# Patient Record
Sex: Male | Born: 1975 | Race: White | Hispanic: No | Marital: Single | Smoking: Former smoker
Health system: Southern US, Community
[De-identification: ages and names within clinical notes are randomized; demographics above are authoritative.]

## PROBLEM LIST (undated history)

## (undated) DIAGNOSIS — F1110 Opioid abuse, uncomplicated: Secondary | ICD-10-CM

## (undated) DIAGNOSIS — J939 Pneumothorax, unspecified: Secondary | ICD-10-CM

## (undated) DIAGNOSIS — M549 Dorsalgia, unspecified: Secondary | ICD-10-CM

## (undated) DIAGNOSIS — IMO0002 Reserved for concepts with insufficient information to code with codable children: Secondary | ICD-10-CM

## (undated) DIAGNOSIS — G8929 Other chronic pain: Secondary | ICD-10-CM

## (undated) DIAGNOSIS — J439 Emphysema, unspecified: Secondary | ICD-10-CM

## (undated) HISTORY — PX: OTHER SURGICAL HISTORY: SHX169

## (undated) HISTORY — PX: LUNG SURGERY: SHX703

## (undated) HISTORY — PX: NOSE SURGERY: SHX723

---

## 1998-06-26 ENCOUNTER — Inpatient Hospital Stay (HOSPITAL_COMMUNITY): Admission: EM | Admit: 1998-06-26 | Discharge: 1998-07-01 | Payer: Self-pay | Admitting: Emergency Medicine

## 1998-06-26 ENCOUNTER — Encounter: Payer: Self-pay | Admitting: Thoracic Surgery

## 1998-06-27 ENCOUNTER — Encounter: Payer: Self-pay | Admitting: Thoracic Surgery

## 1998-06-28 ENCOUNTER — Encounter: Payer: Self-pay | Admitting: Thoracic Surgery

## 1998-06-29 ENCOUNTER — Encounter: Payer: Self-pay | Admitting: Thoracic Surgery

## 1998-06-30 ENCOUNTER — Encounter: Payer: Self-pay | Admitting: Thoracic Surgery

## 1998-07-01 ENCOUNTER — Encounter: Payer: Self-pay | Admitting: Thoracic Surgery

## 2008-03-02 ENCOUNTER — Emergency Department (HOSPITAL_BASED_OUTPATIENT_CLINIC_OR_DEPARTMENT_OTHER): Admission: EM | Admit: 2008-03-02 | Discharge: 2008-03-02 | Payer: Self-pay | Admitting: Emergency Medicine

## 2008-03-02 ENCOUNTER — Ambulatory Visit: Payer: Self-pay | Admitting: Diagnostic Radiology

## 2008-08-12 ENCOUNTER — Emergency Department (HOSPITAL_BASED_OUTPATIENT_CLINIC_OR_DEPARTMENT_OTHER): Admission: EM | Admit: 2008-08-12 | Discharge: 2008-08-13 | Payer: Self-pay | Admitting: Emergency Medicine

## 2008-08-13 ENCOUNTER — Ambulatory Visit: Payer: Self-pay | Admitting: Diagnostic Radiology

## 2008-08-22 ENCOUNTER — Ambulatory Visit: Payer: Self-pay | Admitting: Interventional Radiology

## 2008-08-22 ENCOUNTER — Emergency Department (HOSPITAL_BASED_OUTPATIENT_CLINIC_OR_DEPARTMENT_OTHER): Admission: EM | Admit: 2008-08-22 | Discharge: 2008-08-22 | Payer: Self-pay | Admitting: Emergency Medicine

## 2008-11-05 ENCOUNTER — Emergency Department (HOSPITAL_BASED_OUTPATIENT_CLINIC_OR_DEPARTMENT_OTHER): Admission: EM | Admit: 2008-11-05 | Discharge: 2008-11-05 | Payer: Self-pay | Admitting: Emergency Medicine

## 2009-06-28 ENCOUNTER — Encounter: Payer: Self-pay | Admitting: Internal Medicine

## 2009-07-09 ENCOUNTER — Encounter: Payer: Self-pay | Admitting: Internal Medicine

## 2009-08-01 ENCOUNTER — Ambulatory Visit: Payer: Self-pay | Admitting: Internal Medicine

## 2009-08-01 DIAGNOSIS — M2550 Pain in unspecified joint: Secondary | ICD-10-CM

## 2009-08-01 DIAGNOSIS — F329 Major depressive disorder, single episode, unspecified: Secondary | ICD-10-CM

## 2009-08-01 DIAGNOSIS — R197 Diarrhea, unspecified: Secondary | ICD-10-CM

## 2009-08-01 DIAGNOSIS — R51 Headache: Secondary | ICD-10-CM

## 2009-08-01 DIAGNOSIS — J309 Allergic rhinitis, unspecified: Secondary | ICD-10-CM | POA: Insufficient documentation

## 2009-08-01 DIAGNOSIS — M545 Low back pain: Secondary | ICD-10-CM

## 2009-08-01 DIAGNOSIS — R519 Headache, unspecified: Secondary | ICD-10-CM | POA: Insufficient documentation

## 2009-08-01 DIAGNOSIS — F411 Generalized anxiety disorder: Secondary | ICD-10-CM | POA: Insufficient documentation

## 2009-08-01 DIAGNOSIS — F32A Depression, unspecified: Secondary | ICD-10-CM | POA: Insufficient documentation

## 2009-08-01 LAB — CONVERTED CEMR LAB
ALT: 10 units/L (ref 0–53)
AST: 13 units/L (ref 0–37)
Albumin: 4.4 g/dL (ref 3.5–5.2)
Alkaline Phosphatase: 89 units/L (ref 39–117)
Basophils Absolute: 0 10*3/uL (ref 0.0–0.1)
CO2: 25 meq/L (ref 19–32)
Chloride: 101 meq/L (ref 96–112)
Eosinophils Relative: 1 % (ref 0–5)
Glucose, Bld: 93 mg/dL (ref 70–99)
Indirect Bilirubin: 0.3 mg/dL (ref 0.0–0.9)
Lymphs Abs: 2.1 10*3/uL (ref 0.7–4.0)
MCV: 93.4 fL (ref 78.0–100.0)
Monocytes Absolute: 1.2 10*3/uL — ABNORMAL HIGH (ref 0.1–1.0)
Monocytes Relative: 9 % (ref 3–12)
Neutrophils Relative %: 73 % (ref 43–77)
Platelets: 389 10*3/uL (ref 150–400)
Potassium: 4.6 meq/L (ref 3.5–5.3)
Rhuematoid fact SerPl-aCnc: <20 intl units/mL (ref 0–20)
Sed Rate: 12 mm/hr (ref 0–16)
Sodium: 138 meq/L (ref 135–145)
Total Protein: 6.8 g/dL (ref 6.0–8.3)
WBC: 13.1 10*3/uL — ABNORMAL HIGH (ref 4.0–10.5)

## 2009-08-02 ENCOUNTER — Encounter: Payer: Self-pay | Admitting: Internal Medicine

## 2009-08-03 ENCOUNTER — Telehealth: Payer: Self-pay | Admitting: Internal Medicine

## 2009-08-15 ENCOUNTER — Encounter: Payer: Self-pay | Admitting: Internal Medicine

## 2009-10-19 ENCOUNTER — Telehealth: Payer: Self-pay | Admitting: Internal Medicine

## 2009-11-05 ENCOUNTER — Ambulatory Visit (HOSPITAL_COMMUNITY): Admission: RE | Admit: 2009-11-05 | Discharge: 2009-11-05 | Payer: Self-pay | Admitting: Psychiatry

## 2010-03-26 NOTE — Letter (Signed)
   [facility] [address] [place], [place]  [address]  [place] Phone: [phone]      August 02, 2009   Brett Beck [address] [place], [place] [address]  RE:  LAB RESULTS  Dear  [name],  The following is an interpretation of your most recent lab tests.  Please take note of any instructions provided or changes to medications that have resulted from your lab work.  ELECTROLYTES:  Good - no changes needed  KIDNEY FUNCTION TESTS:  Good - no changes needed  LIVER FUNCTION TESTS:  Good - no changes needed  THYROID STUDIES:  Thyroid studies normal TSH: 2.302     CBC:  Fair - review at your next visit  ANA - negative Rheumatoid factor - negative Sed rate - negative       Sincerely Yours,    Dr. Thomos Lemons

## 2010-03-26 NOTE — Progress Notes (Signed)
Summary: refill--clonazepam  Phone Note Refill Request Message from:  Fax from [facility] on October 19, 2009 4:36 PM  Refills Requested: Medication #1:  CLONAZEPAM 2 MG TABS one by mouth once daily as needed   Dosage confirmed as above?Dosage Confirmed   Supply Requested: 1 month   Last Refilled: 09/17/2009 Pt last seen 08/01/09.  Next Appointment Scheduled: none Initial call taken by: Mervin Kung CMA Duncan Dull),  October 19, 2009 4:36 PM  Follow-up for Phone Call        pt was referred to psych.  if he was seen by Dr. Evelene Croon, refill requests should go to Dr. Evelene Croon Follow-up by: D. Thomos Lemons DO,  October 19, 2009 4:59 PM  Additional Follow-up for Phone Call Additional follow up Details #1::        Pt states he had to cancel appt with psych due to mother's health problem.  Does not have follow up scheduled yet, they told him they could not see for 4-6 weeks. Please advise. Nicki Guadalajara Fergerson CMA Duncan Dull)  October 19, 2009 5:08 PM     Additional Follow-up for Phone Call Additional follow up Details #2::    refill clonazepam x 2.  pt needs to reschedule psych visit Follow-up by: D. Thomos Lemons DO,  October 22, 2009 8:39 AM  Additional Follow-up for Phone Call Additional follow up Details #3:: Details for Additional Follow-up Action Taken: Med refilled. Left message on pt's cell # to reschedule psych appt. Nicki Guadalajara Fergerson CMA Duncan Dull)  October 22, 2009 10:23 AM   Prescriptions: CLONAZEPAM 2 MG TABS (CLONAZEPAM) one by mouth once daily as needed  #30 x 1   Entered by:   Mervin Kung CMA (AAMA)   Authorized by:   D. Thomos Lemons DO   Signed by:   Mervin Kung CMA (AAMA) on 10/22/2009   Method used:   Telephoned to ...       [facility]* (retail)       [address]       [place], [place]  [address]       Ph: [phone]       Fax: [phone]   RxID:   [id number]

## 2010-03-26 NOTE — Assessment & Plan Note (Signed)
Summary: TO EST/   hea   Vital Signs:  Patient profile:   35 year old male Height:      72 inches Weight:      159.50 pounds BMI:     21.71 O2 Sat:      99 % on Room air Temp:     98.3 degrees F oral Pulse rate:   107 / minute Pulse rhythm:   regular Resp:     24 per minute BP sitting:   122 / 70  (right arm) Cuff size:   regular  Vitals Entered By: Glendell Docker CMA (August 01, 2009 9:38 AM)  O2 Flow:  Room air CC: Rm 2- New Patient  Is Patient Diabetic? No Comments evaluated by ortho for back and neck problems, fasting for labs, c/o bilateral hip discomfort, discuss anxiety has appt scheduled with psychiatry on 08/30/2009, had 3 MRI at [facility]   Primary Care Provider:  Dondra Spry DO  CC:  Rm 2- New Patient .  History of Present Illness: 35 y/o to establish Prev pcp Dr. Modesto Charon  Chronic stomach issues - chronic loose stools, abd cramping Prev EGD - normal,  stool studies normal presumed IBS  hx of anxiety/ depression - chronic hx of insomnia takes care of mom and nephew.  prev psych - Elna Breslow has appt Dr. Evelene Croon use to take klonopin balance issues with alprazolam tried xanax - caused behavariol problem  2 PTX of left lung - chest tube x 2.   hx of blebs  hx of chronic back pain - dr.  Renae Fickle (ortho) due to upper neck pain and low back pain MRI x 3 of low back and neck possible radiculopathy ortho recommended - steroid injections.  he is reluctant to get injections Dr. Renae Fickle has been prescribing pain meds used percocet,  not started lyrica  low back pain is more bothersome.   throbbing, stabbing sensation difficulty getting out bed also gets intermittent hip pain    Preventive Screening-Counseling & Management  Alcohol-Tobacco     Alcohol drinks/day: 0     Smoking Status: current     Packs/Day: 1.0     Year Started: 1995  Caffeine-Diet-Exercise     Caffeine use/day: 6- 8 beverages daily     Does Patient Exercise: yes     Times/week:  3  Allergies (verified): 1)  ! Tramadol Hcl 2)  ! Vicodin  Past History:  Past Medical History: Allergic rhinitis Depression Headache  Hx of pneumothorax - 2000  Past Surgical History: Broken nose 1996 collapsed lung 2000   Family History: Family History of Alcoholism/Addiction Family History of Arthritis Family History High cholesterol Family History Hypertension    Social History: Occupation: self employed Single no children Current Smoker  Smoking Status:  current Packs/Day:  1.0 Caffeine use/day:  6- 8 beverages daily Does Patient Exercise:  yes  Physical Exam  General:  alert,  thin Head:  normocephalic and atraumatic.   Eyes:  pupils equal, pupils round, and pupils reactive to light.   Ears:  R ear normal and L ear normal.   Mouth:  upper and lower dental plate Neck:  supple, no masses, and no carotid bruits.   Lungs:  normal respiratory effort and normal breath sounds.   Heart:  normal rate, regular rhythm, and no gallop.   Abdomen:  soft, non-tender, normal bowel sounds, no masses, no hepatomegaly, and no splenomegaly.   Msk:  left and right hip tenderness,  mild  pain with int and ext rotation Extremities:  trace left pedal edema and trace right pedal edema.   Neurologic:  cranial nerves II-XII intact, strength normal in all extremities, gait normal, and DTRs symmetrical and normal.   Skin:  warm,  dry,  no rashes Psych:  depressed affect and flat affect.     Impression & Recommendations:  Problem # 1:  PAIN IN JOINT, MULTIPLE SITES (ICD-719.49) Probable fibromyalgia.   check labs.  start gabapentin Orders: T-Hepatic Function [phone]) T-CBC w/Diff [phone]) T- Sed rate non-auto (29562) T-Rheumatoid Factor [id number]) T-Antinuclear Antib (ANA) [phone]) T-Basic Metabolic Panel [phone])  Problem # 2:  BACK PAIN, LUMBAR, CHRONIC (ICD-724.2) avoid long term narcotic use if possible.  The following medications were removed  from the medication list:    Percocet 7.5-325 Mg Tabs (Oxycodone-acetaminophen) .Marland Kitchen... Take 1 tablet by mouth three times a day  Orders: Physical Therapy Referral (PT)  Problem # 3:  ANXIETY (ICD-300.00) start effexor.  check TFTs His updated medication list for this problem includes:    Clonazepam 2 Mg Tabs (Clonazepam) ..... One by mouth once daily as needed    Venlafaxine Hcl 37.5 Mg Xr24h-cap (Venlafaxine hcl) ..... One by mouth once daily  Orders: T-TSH [phone]) T-T4, Free [phone])  Complete Medication List: 1)  Clonazepam 2 Mg Tabs (Clonazepam) .... One by mouth once daily as needed 2)  Gabapentin 100 Mg Caps (Gabapentin) .... One by mouth bid 3)  Venlafaxine Hcl 37.5 Mg Xr24h-cap (Venlafaxine hcl) .... One by mouth once daily 4)  Voltaren 1 % Gel (Diclofenac sodium) .... Apply 2 grams three times a day  Other Orders: T- * Misc. Laboratory test [phone])  Patient Instructions: 1)  Please schedule a follow-up appointment in 1 month. 2)  Take ibuprofen 400-600 mg two times a day with food x 1 week Prescriptions: VENLAFAXINE HCL 37.5 MG XR24H-CAP (VENLAFAXINE HCL) one by mouth once daily  #30 x 0   Entered and Authorized by:   D. Thomos Lemons DO   Signed by:   D. Thomos Lemons DO on 08/01/2009   Method used:   Print then Give to Patient   RxID:   [id number] CLONAZEPAM 2 MG TABS (CLONAZEPAM) one by mouth once daily as needed  #30 x 1   Entered and Authorized by:   D. Thomos Lemons DO   Signed by:   D. Thomos Lemons DO on 08/01/2009   Method used:   Print then Give to Patient   RxID:   [id number] GABAPENTIN 100 MG CAPS (GABAPENTIN) one by mouth bid  #60 x 1   Entered and Authorized by:   D. Thomos Lemons DO   Signed by:   D. Thomos Lemons DO on 08/01/2009   Method used:   Electronically to        [facility]* (retail)       [address]       [place], [place]  [address]       Ph: [phone]       Fax: [phone]   RxID:    [id number]   Current Allergies (reviewed today): ! TRAMADOL HCL ! VICODIN

## 2010-03-26 NOTE — Progress Notes (Signed)
Summary: Lyrica rx,  ? Effexor side effects?  Phone Note Call from Patient Call back at [phone]   Caller: Patient Call For: D. Thomos Lemons DO Summary of Call: Pt states he had rx of Lyrica 75mg  1 two times a day for back pain from Dr. Renae Fickle, orthopedic. He has not filled rx yet and stated that he mentioned this at his visit and was told you  may want to give him a higher dose.  Please advise.  Mervin Kung CMA  August 03, 2009 4:07 PM   Follow-up for Phone Call        no we prescribed gabapentin which we can potentially increase dose.   If pains not better, pt can take 3 caps of gabapentin or 300 mg two times a day .   If no improvement in pain,  needs f/u OV within 2 weeks Follow-up by: D. Thomos Lemons DO,  August 03, 2009 5:09 PM  Additional Follow-up for Phone Call Additional follow up Details #1::        Advised pt of Dr. Olegario Messier instructions. Pt voices understanding.  Pt states the Effexor is causing nausea and diarrhea.  Please advise.  Mervin Kung CMA  August 03, 2009 5:44 PM     Additional Follow-up for Phone Call Additional follow up Details #2::    have pt take medication with food Follow-up by: D. Thomos Lemons DO,  August 06, 2009 2:43 PM  Additional Follow-up for Phone Call Additional follow up Details #3:: Details for Additional Follow-up Action Taken: pt informed Additional Follow-up by: Margaret Pyle, CMA,  August 07, 2009 9:20 AM

## 2010-03-26 NOTE — Letter (Signed)
Summary: [facility]  [facility]   Imported By: Lanelle Bal 08/30/2009 12:18:16  _____________________________________________________________________  External Attachment:    Type:   Image     Comment:   External Document

## 2011-12-07 ENCOUNTER — Emergency Department (HOSPITAL_COMMUNITY)
Admission: EM | Admit: 2011-12-07 | Discharge: 2011-12-07 | Disposition: A | Discharge status: Home / Self Care | Payer: Self-pay | Attending: Emergency Medicine | Admitting: Emergency Medicine

## 2011-12-07 ENCOUNTER — Encounter (HOSPITAL_COMMUNITY): Payer: Self-pay | Admitting: *Deleted

## 2011-12-07 DIAGNOSIS — F1920 Other psychoactive substance dependence, uncomplicated: Secondary | ICD-10-CM

## 2011-12-07 DIAGNOSIS — F1120 Opioid dependence, uncomplicated: Secondary | ICD-10-CM | POA: Insufficient documentation

## 2011-12-07 DIAGNOSIS — F19939 Other psychoactive substance use, unspecified with withdrawal, unspecified: Secondary | ICD-10-CM | POA: Insufficient documentation

## 2011-12-07 HISTORY — DX: Reserved for concepts with insufficient information to code with codable children: IMO0002

## 2011-12-07 LAB — COMPREHENSIVE METABOLIC PANEL
ALT: 12 U/L (ref 0–53)
BUN: 8 mg/dL (ref 6–23)
CO2: 32 mEq/L (ref 19–32)
Calcium: 9.4 mg/dL (ref 8.4–10.5)
GFR calc Af Amer: >90 mL/min (ref >90)
GFR calc non Af Amer: >90 mL/min (ref >90)
Glucose, Bld: 153 mg/dL — ABNORMAL HIGH (ref 70–99)
Sodium: 141 mEq/L (ref 135–145)

## 2011-12-07 LAB — RAPID URINE DRUG SCREEN, HOSP PERFORMED
Amphetamines: NOT DETECTED
Tetrahydrocannabinol: NOT DETECTED

## 2011-12-07 LAB — CBC
HCT: 43.1 % (ref 39.0–52.0)
Hemoglobin: 14.9 g/dL (ref 13.0–17.0)
MCH: 32.3 pg (ref 26.0–34.0)
MCHC: 34.6 g/dL (ref 30.0–36.0)
MCV: 93.5 fL (ref 78.0–100.0)
RBC: 4.61 MIL/uL (ref 4.22–5.81)

## 2011-12-07 LAB — SALICYLATE LEVEL: Salicylate Lvl: <2 mg/dL — ABNORMAL LOW (ref 2.8–20.0)

## 2011-12-07 LAB — ETHANOL: Alcohol, Ethyl (B): <11 mg/dL (ref 0–11)

## 2011-12-07 MED ORDER — METHOCARBAMOL 500 MG PO TABS
500.0000 mg | ORAL_TABLET | Freq: Three times a day (TID) | ORAL | Status: DC | PRN
  Administered 2011-12-07: 500 mg via ORAL
  Filled 2011-12-07: qty 1

## 2011-12-07 MED ORDER — NAPROXEN 500 MG PO TABS
500.0000 mg | ORAL_TABLET | Freq: Two times a day (BID) | ORAL | Status: DC | PRN
  Administered 2011-12-07: 500 mg via ORAL
  Filled 2011-12-07: qty 1

## 2011-12-07 MED ORDER — ONDANSETRON HCL 4 MG/2ML IJ SOLN
4.0000 mg | Freq: Once | INTRAMUSCULAR | Status: AC
  Administered 2011-12-07: 4 mg via INTRAVENOUS
  Filled 2011-12-07: qty 2

## 2011-12-07 MED ORDER — LORAZEPAM 2 MG/ML IJ SOLN
1.0000 mg | Freq: Once | INTRAMUSCULAR | Status: AC
  Administered 2011-12-07: 1 mg via INTRAVENOUS
  Filled 2011-12-07: qty 1

## 2011-12-07 MED ORDER — CLONIDINE HCL 0.1 MG PO TABS: 0.1000 mg | ORAL_TABLET | Freq: Every day | ORAL | Status: DC

## 2011-12-07 MED ORDER — NICOTINE 21 MG/24HR TD PT24
21.0000 mg | MEDICATED_PATCH | Freq: Once | TRANSDERMAL | Status: DC
  Administered 2011-12-07: 21 mg via TRANSDERMAL
  Filled 2011-12-07 (×2): qty 1

## 2011-12-07 MED ORDER — DICYCLOMINE HCL 20 MG PO TABS
20.0000 mg | ORAL_TABLET | Freq: Four times a day (QID) | ORAL | Status: DC | PRN
  Administered 2011-12-07: 20 mg via ORAL
  Filled 2011-12-07: qty 1

## 2011-12-07 MED ORDER — LOPERAMIDE HCL 2 MG PO CAPS: 2.0000 mg | ORAL_CAPSULE | ORAL | Status: DC | PRN

## 2011-12-07 MED ORDER — ONDANSETRON 4 MG PO TBDP: 4.0000 mg | ORAL_TABLET | Freq: Four times a day (QID) | ORAL | Status: DC | PRN

## 2011-12-07 MED ORDER — CLONIDINE HCL 0.1 MG PO TABS
0.1000 mg | ORAL_TABLET | Freq: Four times a day (QID) | ORAL | Status: DC
  Administered 2011-12-07: 0.1 mg via ORAL
  Filled 2011-12-07 (×2): qty 1

## 2011-12-07 MED ORDER — ONDANSETRON HCL 4 MG PO TABS: 4.0000 mg | ORAL_TABLET | Freq: Three times a day (TID) | ORAL | Status: DC | PRN

## 2011-12-07 MED ORDER — CLONIDINE HCL 0.1 MG PO TABS: 0.1000 mg | ORAL_TABLET | ORAL | Status: DC

## 2011-12-07 MED ORDER — ONDANSETRON HCL 4 MG/2ML IJ SOLN: 4.0000 mg | Freq: Four times a day (QID) | INTRAMUSCULAR | Status: DC | PRN

## 2011-12-07 MED ORDER — ACETAMINOPHEN 325 MG PO TABS
650.0000 mg | ORAL_TABLET | ORAL | Status: DC | PRN
  Administered 2011-12-07: 650 mg via ORAL
  Filled 2011-12-07: qty 2

## 2011-12-07 MED ORDER — ZOLPIDEM TARTRATE 5 MG PO TABS: 5.0000 mg | ORAL_TABLET | Freq: Every evening | ORAL | Status: DC | PRN

## 2011-12-07 MED ORDER — LORAZEPAM 2 MG/ML IJ SOLN: 1.0000 mg | INTRAMUSCULAR | Status: DC | PRN

## 2011-12-07 MED ORDER — SODIUM CHLORIDE 0.9 % IV BOLUS (SEPSIS)
1000.0000 mL | Freq: Once | INTRAVENOUS | Status: AC
  Administered 2011-12-07: 1000 mL via INTRAVENOUS

## 2011-12-07 MED ORDER — LORAZEPAM 1 MG PO TABS
1.0000 mg | ORAL_TABLET | Freq: Three times a day (TID) | ORAL | Status: DC | PRN
  Filled 2011-12-07: qty 1

## 2011-12-07 MED ORDER — NICOTINE 21 MG/24HR TD PT24
21.0000 mg | MEDICATED_PATCH | Freq: Every day | TRANSDERMAL | Status: DC
  Administered 2011-12-07: 21 mg via TRANSDERMAL

## 2011-12-07 MED ORDER — HYDROXYZINE HCL 25 MG PO TABS
25.0000 mg | ORAL_TABLET | Freq: Four times a day (QID) | ORAL | Status: DC | PRN
  Filled 2011-12-07: qty 1

## 2011-12-07 NOTE — ED Notes (Signed)
Pt reports having chronic back problems that he had to start oxycodone for the past 2 years. Pt is trying to stay off medication, but is having withdrawal. Pt reports last dose was one day prior.

## 2011-12-07 NOTE — ED Notes (Signed)
Up to the desk on the phone 

## 2011-12-07 NOTE — ED Notes (Signed)
Pt has been wanded and scrubbed. 

## 2011-12-07 NOTE — ED Notes (Signed)
Patient is alert and oriented x3.  He is requesting help to get off of oxycodone. He started taking oxycodone after having back problems.  Last time taken was  Approximately 24 hours ago.  He states his pain is 8 of 10 currently.

## 2011-12-07 NOTE — ED Notes (Signed)
Pt sitting quietly, reports that he wants to leave. Pt reports that he not going to be able to be placed today and that he will be more comfortable at home while his waiting for a bed.  Pt also reports that he knows a Buyer, retail. That he can contact tomorrow for treatment.  Pt is also concerned about his mother-he lives w/ here and is her primary care giver.  Pt encouraged to stay for treatment, but has decided to leave.  Pt reports that he has phenergan, zofran and a clonidine patch at home that he can use.

## 2011-12-07 NOTE — ED Notes (Signed)
Written dc instructions reviewed w/ pt, pt verbalized understanding.  Pt encouraged to follow up w/ OP as instructed.

## 2011-12-07 NOTE — ED Provider Notes (Signed)
History     CSN: [id number]  Arrival date & time 12/07/11  0045   First MD Initiated Contact with Patient 12/07/11 0141      Chief Complaint  Patient presents with  . Withdrawal    oxycodone    (Consider location/radiation/quality/duration/timing/severity/associated sxs/prior treatment) Patient is a 36 y.o. male presenting with drug problem. The history is provided by the patient.  Drug Problem Associated symptoms include nausea. Pertinent negatives include no abdominal pain, chills or fever. Associated symptoms comments: He reports taking 75 mg oxycodone daily for "years" and wants to wean off of them. He has no medication at home and reports last use around 24 hours ago. Current symptoms are nausea without vomiting. No fever. He has his usual lower back pain..    Past Medical History  Diagnosis Date  . Degenerative disc disease     History reviewed. No pertinent past surgical history.  History reviewed. No pertinent family history.  History  Substance Use Topics  . Smoking status: Current Every Day Smoker  . Smokeless tobacco: Not on file  . Alcohol Use:       Review of Systems  Constitutional: Negative for fever and chills.  HENT: Negative.   Respiratory: Negative.   Cardiovascular: Negative.   Gastrointestinal: Positive for nausea. Negative for abdominal pain.  Musculoskeletal: Positive for back pain.  Skin: Negative.   Neurological: Negative.   Psychiatric/Behavioral: Positive for behavioral problems and agitation.    Allergies  Hydrocodone-acetaminophen and Tramadol hcl  Home Medications   Current Outpatient Rx  Name Route Sig Dispense Refill  . CLONAZEPAM 2 MG PO TABS Oral Take 2 mg by mouth 2 (two) times daily as needed. Anxiety    . OXYCODONE-ACETAMINOPHEN 5-325 MG PO TABS Oral Take 1 tablet by mouth every 4 (four) hours as needed. Pain      BP 127/86  Pulse 80  Temp 98.2 F (36.8 C) (Oral)  Resp 17  SpO2 100%  Physical Exam    Constitutional: He is oriented to person, place, and time. He appears well-developed and well-nourished.  HENT:  Head: Normocephalic.  Neck: Normal range of motion. Neck supple.  Cardiovascular: Normal rate and regular rhythm.   Pulmonary/Chest: Effort normal and breath sounds normal.  Abdominal: Soft. Bowel sounds are normal. There is no tenderness. There is no rebound and no guarding.  Musculoskeletal: Normal range of motion.  Neurological: He is alert and oriented to person, place, and time.  Skin: Skin is warm and dry. No rash noted.  Psychiatric: He has a normal mood and affect.    ED Course  Procedures (including critical care time)  Labs Reviewed  CBC - Abnormal; Notable for the following:    Platelets 434 (*)     All other components within normal limits  COMPREHENSIVE METABOLIC PANEL - Abnormal; Notable for the following:    Glucose, Bld 153 (*)     Alkaline Phosphatase 128 (*)     Total Bilirubin 0.2 (*)     All other components within normal limits  SALICYLATE LEVEL - Abnormal; Notable for the following:    Salicylate Lvl <2.0 (*)     All other components within normal limits  URINE RAPID DRUG SCREEN (HOSP PERFORMED) - Abnormal; Notable for the following:    Opiates POSITIVE (*)     Benzodiazepines POSITIVE (*)     All other components within normal limits  ETHANOL  ACETAMINOPHEN LEVEL   Results for orders placed during the hospital encounter of 12/07/11  CBC      Component Value Range   WBC 6.9  4.0 - 10.5 K/uL   RBC 4.61  4.22 - 5.81 MIL/uL   Hemoglobin 14.9  13.0 - 17.0 g/dL   HCT 54.0  98.1 - 19.1 %   MCV 93.5  78.0 - 100.0 fL   MCH 32.3  26.0 - 34.0 pg   MCHC 34.6  30.0 - 36.0 g/dL   RDW 47.8  29.5 - 62.1 %   Platelets 434 (*) 150 - 400 K/uL  COMPREHENSIVE METABOLIC PANEL      Component Value Range   Sodium 141  135 - 145 mEq/L   Potassium 4.3  3.5 - 5.1 mEq/L   Chloride 103  96 - 112 mEq/L   CO2 32  19 - 32 mEq/L   Glucose, Bld 153 (*) 70 - 99  mg/dL   BUN 8  6 - 23 mg/dL   Creatinine, Ser 3.08  0.50 - 1.35 mg/dL   Calcium 9.4  8.4 - 65.7 mg/dL   Total Protein 6.8  6.0 - 8.3 g/dL   Albumin 3.7  3.5 - 5.2 g/dL   AST 16  0 - 37 U/L   ALT 12  0 - 53 U/L   Alkaline Phosphatase 128 (*) 39 - 117 U/L   Total Bilirubin 0.2 (*) 0.3 - 1.2 mg/dL   GFR calc non Af Amer >90  >90 mL/min   GFR calc Af Amer >90  >90 mL/min  ETHANOL      Component Value Range   Alcohol, Ethyl (B) <11  0 - 11 mg/dL  ACETAMINOPHEN LEVEL      Component Value Range   Acetaminophen (Tylenol), Serum <15.0  10 - 30 ug/mL  SALICYLATE LEVEL      Component Value Range   Salicylate Lvl <2.0 (*) 2.8 - 20.0 mg/dL  URINE RAPID DRUG SCREEN (HOSP PERFORMED)      Component Value Range   Opiates POSITIVE (*) NONE DETECTED   Cocaine NONE DETECTED  NONE DETECTED   Benzodiazepines POSITIVE (*) NONE DETECTED   Amphetamines NONE DETECTED  NONE DETECTED   Tetrahydrocannabinol NONE DETECTED  NONE DETECTED   Barbiturates NONE DETECTED  NONE DETECTED    No results found.   No diagnosis found.  1. Opiate dependence.   MDM  [facility] in to see patient who is becoming increasingly agitated and difficult. Discussed length of time placement can take. He reports he will wait.        Rodena Medin, PA-C 12/07/11 [phone]

## 2011-12-07 NOTE — Progress Notes (Signed)
Pt wants to go to [facility] for detox off pain meds.  [facility] full and pt informed.  Pt may want to leave at some point today and follow up with [facility] on his own.  Pt will let ED know.  Pt is not suicidal, homicidal and does not display any behaviors, thoughts that appear to be safety related concerns.

## 2011-12-07 NOTE — ED Provider Notes (Signed)
Patient awaiting placement at a rehabilitation facility  Toy Baker, MD 12/07/11 1007

## 2011-12-07 NOTE — ED Notes (Signed)
Father in and out of nursing station, pacing, looking for PA. PA discussed plan of care with father.

## 2011-12-07 NOTE — ED Notes (Signed)
Service Response called and pt has been added to the breakfast list.

## 2011-12-07 NOTE — ED Notes (Signed)
ACT into see 

## 2011-12-07 NOTE — ED Notes (Signed)
Pt and father requesting to know plan of care, pt feels if we are only going to give Ativan he can go home and detox with his Klonopin, Phenergan and Zofran. When asked if has detoxed before, he stated no. Father and pt wanting to define type of meds they want used. Explained to both that we have protocols we abide by as well as doctors orders. They are undecided if pt will stay or go home. Due to this process dragging out over a period of hours,  a time limit has been set for 7:45 am to give decision on treatment plan.

## 2011-12-07 NOTE — ED Notes (Signed)
Act into see 

## 2011-12-07 NOTE — BH Assessment (Signed)
Assessment Note   Brett Beck is an 36 y.o. male who presents voluntarily to [facility] with his father and pt requests detox from oxycodone which he is prescribed. Pt sts he takes one or two pills more than prescribed daily. He says he has been taking oxycodone daily for 2 yrs and gets med from [facility] MD. Pt sts he wants to stop taking oxycodone b/c his family is constantly telling him to stop and he is tired of listening to their complaints of his use. Pt endorses depressed mood with sadness, fatigue, loss of interest, isolating, tearfulness, worthlessness and guilt. He says he stays in bed most days b/c he is depressed. His affect is depressed and irritably. Pt sts that wants to go to a detox program that used suboxone. 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 He denies SI and HI. He denies [facility] and no delusions noted.  Current withdrawal symptoms include stomach cramps, diarrhea, chills, nausea. Pt has hx of outpt treatment for depression.  Axis I: Opiate Dependence            Major Depressive D/O, Recurrent, Severe without Psychotic Features Axis II: Deferred Axis III:  Past Medical History  Diagnosis Date  . Degenerative disc disease    Axis IV: occupational problems, other psychosocial or environmental problems, problems related to social environment and problems with primary support group Axis V: 41-50 serious symptoms  Past Medical History:  Past Medical History  Diagnosis Date  . Degenerative disc disease     History reviewed. No pertinent past surgical history.  Family History: History reviewed. No pertinent family history.  Social History:  reports that he has been smoking.  He does not have any smokeless tobacco history on file. His alcohol and drug histories not on file.  Additional Social History:  Alcohol / Drug Use Pain Medications: pt sts he takes more oxycodone than directed Prescriptions: see PTA meds Over the Counter:  na  CIWA: CIWA-Ar BP: 127/86 mmHg Pulse Rate: 80  COWS: Clinical Opiate Withdrawal Scale (COWS) Resting Pulse Rate: Pulse Rate 80 or below Sweating: Subjective report of chills or flushing Restlessness: Able to sit still Pupil Size: Pupils pinned or normal size for room light Bone or Joint Aches: Patient is rubbing joints or muscles and is unable to sit still because of discomfort Runny Nose or Tearing: Nasal stuffiness or unusually moist eyes GI Upset: nausea or loose stool Tremor: No tremor Yawning: No yawning Anxiety or Irritability: Patient reports increasing irritability or anxiousness Gooseflesh Skin: Skin is smooth COWS Total Score: 9   Allergies:  Allergies  Allergen Reactions  . Hydrocodone-Acetaminophen     REACTION: severe headaches  . Tramadol Hcl     REACTION: diarrhea, abdominal pain    Home Medications:  (Not in a hospital admission)  OB/GYN Status:  No LMP for male patient.  General Assessment Data Location of Assessment: [facility] ED Living Arrangements: Parent Can pt return to current living arrangement?: Yes Admission Status: Voluntary Is patient capable of signing voluntary admission?: Yes Transfer from: Home Referral Source: Self/Family/Friend  Education Status Is patient currently in school?: No Current Grade: na Highest grade of school patient has completed: 12  Risk to self Suicidal Ideation: No Suicidal Intent: No Is patient at risk for suicide?: No Suicidal Plan?: No Access to Means: No What has been your use of drugs/alcohol within the last 12 months?: daily use of oxycodone Previous Attempts/Gestures: No How many times?: 0  Other Self Harm Risks: na Triggers for Past Attempts:  (na) Intentional Self Injurious Behavior: None Family Suicide History: Yes (uncle committed suicide) Recent stressful life event(s): Other (Comment) (primary caregiver for mom who has terminal illness) Persecutory voices/beliefs?: No Depression: Yes Depression  Symptoms: Despondent;Isolating;Tearfulness;Loss of interest in usual pleasures;Feeling worthless/self pity;Fatigue;Guilt Substance abuse history and/or treatment for substance abuse?: No Suicide prevention information given to non-admitted patients: Not applicable  Risk to Others Homicidal Ideation: No Thoughts of Harm to Others: No Current Homicidal Intent: No Current Homicidal Plan: No Access to Homicidal Means: No Identified Victim: none History of harm to others?: No Assessment of Violence: None Noted Violent Behavior Description: pt denies hx of violence Does patient have access to weapons?: No Criminal Charges Pending?: No Does patient have a court date: No  Psychosis Hallucinations: None noted Delusions: None noted  Mental Status Report Appear/Hygiene: Other (Comment) (unremarkable) Eye Contact: Good Motor Activity: Freedom of movement Speech: Logical/coherent Level of Consciousness: Alert Mood: Depressed;Anhedonia;Sad Affect: Appropriate to circumstance;Depressed;Irritable;Sad Anxiety Level: Minimal Thought Processes: Relevant;Coherent Judgement: Impaired Orientation: Person;Place;Time;Situation Obsessive Compulsive Thoughts/Behaviors: None  Cognitive Functioning Concentration: Normal Memory: Recent Intact;Remote Intact IQ: Average Insight: Fair Impulse Control: Poor Appetite: Fair Weight Loss: 0  Weight Gain: 0  Sleep: No Change Total Hours of Sleep: 6  Vegetative Symptoms: None  ADLScreening [facility] Assessment Services) Patient's cognitive ability adequate to safely complete daily activities?: Yes Patient able to express need for assistance with ADLs?: Yes Independently performs ADLs?: Yes (appropriate for developmental age)  Abuse/Neglect [facility]) Physical Abuse: Denies Verbal Abuse: Denies Sexual Abuse: Denies  Prior Inpatient Therapy Prior Inpatient Therapy: No Prior Therapy Dates: na Prior Therapy Facilty/Provider(s): na Reason for Treatment:  na  Prior Outpatient Therapy Prior Outpatient Therapy: Yes Prior Therapy Dates: several yrs ago Prior Therapy Facilty/Provider(s): doesn't provide name of psychiatrist Reason for Treatment: depression  ADL Screening (condition at time of admission) Patient's cognitive ability adequate to safely complete daily activities?: Yes Patient able to express need for assistance with ADLs?: Yes Independently performs ADLs?: Yes (appropriate for developmental age) Weakness of Legs: None Weakness of Arms/Hands: None  Home Assistive Devices/Equipment Home Assistive Devices/Equipment: None    Abuse/Neglect Assessment (Assessment to be complete while patient is alone) Physical Abuse: Denies Verbal Abuse: Denies Sexual Abuse: Denies Exploitation of patient/patient's resources: Denies Self-Neglect: Denies Values / Beliefs Cultural Requests During Hospitalization: None Spiritual Requests During Hospitalization: None   Advance Directives (For Healthcare) Advance Directive: Patient does not have advance directive;Patient would not like information    Additional Information 1:1 In Past 12 Months?: No CIRT Risk: No Elopement Risk: No Does patient have medical clearance?: Yes     Disposition:  Disposition Disposition of Patient: Inpatient treatment program;Outpatient treatment Type of inpatient treatment program: Adult (detox) Type of outpatient treatment: Adult (detox)  On Site Evaluation by:   Reviewed with Physician:     Donnamarie Rossetti P 12/07/2011 5:34 AM

## 2011-12-08 NOTE — ED Provider Notes (Signed)
Medical screening examination/treatment/procedure(s) were performed by non-physician practitioner and as supervising physician I was immediately available for consultation/collaboration.  Giuseppe Duchemin, MD 12/08/11 0009 

## 2012-03-10 ENCOUNTER — Emergency Department (HOSPITAL_COMMUNITY)
Admission: EM | Admit: 2012-03-10 | Discharge: 2012-03-10 | Disposition: A | Discharge status: Home / Self Care | Payer: Self-pay | Attending: Emergency Medicine | Admitting: Emergency Medicine

## 2012-03-10 ENCOUNTER — Encounter (HOSPITAL_COMMUNITY): Payer: Self-pay | Admitting: *Deleted

## 2012-03-10 DIAGNOSIS — R259 Unspecified abnormal involuntary movements: Secondary | ICD-10-CM | POA: Insufficient documentation

## 2012-03-10 DIAGNOSIS — F17200 Nicotine dependence, unspecified, uncomplicated: Secondary | ICD-10-CM | POA: Insufficient documentation

## 2012-03-10 DIAGNOSIS — M549 Dorsalgia, unspecified: Secondary | ICD-10-CM | POA: Insufficient documentation

## 2012-03-10 DIAGNOSIS — Z760 Encounter for issue of repeat prescription: Secondary | ICD-10-CM | POA: Insufficient documentation

## 2012-03-10 DIAGNOSIS — Z8739 Personal history of other diseases of the musculoskeletal system and connective tissue: Secondary | ICD-10-CM | POA: Insufficient documentation

## 2012-03-10 DIAGNOSIS — M129 Arthropathy, unspecified: Secondary | ICD-10-CM | POA: Insufficient documentation

## 2012-03-10 DIAGNOSIS — R110 Nausea: Secondary | ICD-10-CM | POA: Insufficient documentation

## 2012-03-10 DIAGNOSIS — G8929 Other chronic pain: Secondary | ICD-10-CM | POA: Insufficient documentation

## 2012-03-10 MED ORDER — OXYCODONE-ACETAMINOPHEN 5-325 MG PO TABS
2.0000 | ORAL_TABLET | Freq: Once | ORAL | Status: AC
  Administered 2012-03-10: 2 via ORAL
  Filled 2012-03-10: qty 2

## 2012-03-10 NOTE — ED Notes (Signed)
Pt states takes oxycodone 15 mg 5-6 times per day; prescribed by pain management; takes for chronic back pain; is taking more often than prescribed and is causing problems with his family; states family poured out the rest of his meds; states wants to know other resources of possibly coming off of medications; when asked if talked with pain management doctor states has an appointment next wk; denies SI/HI; states is frustrated not knowing what to do

## 2012-03-10 NOTE — ED Notes (Signed)
Pt states he has a ride home

## 2012-03-10 NOTE — ED Provider Notes (Signed)
History   This chart was scribed for [name], PA-C working with Dione Booze, MD by Charolett Bumpers, ED Scribe. This patient was seen in room WTR5/WTR5 and the patient's care was started at 2031.   CSN: [id number]  Arrival date & time 03/10/12  2005   First MD Initiated Contact with Patient 03/10/12 2031      No chief complaint on file.   The history is provided by the patient. No language interpreter was used.   Brett Beck is a 37 y.o. male who presents to the Emergency Department complaining of an opioid problem. He states that he is unsure if he needs to come off of the medication and needs resources. He states that he normally takes 15 mg oxycodone 5-6 times daily for his chronic back pain from arthritis. He reports that he has recently started taking more than prescribed. He states that this led to an argument with a family member who then proceeded to pour his oxycodone out. He states that this situation has escalated over the past few months. He denies taking any other pain medications outside of the oxycodone. He states that his last dose was 2 days ago. He is followed by pain management due to chronic back pain for the past 4-5 years which he sees monthly. He states that his next appointment is next week. He reports associated tremors, nausea and pain. He rates his pain 7-8/10 currently. He denies any vomiting, diarrhea. He states that he is on Imodium for IBS. He denies any SI or HI. He states that he takes care of his ill mother and needs to be able to take care of her. He states that he called the hotline which instructed him to come to ED for evaluation.   Past Medical History  Diagnosis Date  . Degenerative disc disease     History reviewed. No pertinent past surgical history.  No family history on file.  History  Substance Use Topics  . Smoking status: Current Every Day Smoker -- 1.5 packs/day    Types: Cigarettes  . Smokeless tobacco: Not on file  .  Alcohol Use: No      Review of Systems  Gastrointestinal: Positive for nausea. Negative for vomiting and diarrhea.  Musculoskeletal: Positive for back pain.  Neurological: Positive for tremors.  All other systems reviewed and are negative.    Allergies  Hydrocodone-acetaminophen and Tramadol hcl and Ibuprofen which causes abdominal cramps and diarrhea  Home Medications   Current Outpatient Rx  Name  Route  Sig  Dispense  Refill  . CLONAZEPAM 2 MG PO TABS   Oral   Take 2 mg by mouth 2 (two) times daily as needed. Anxiety         . OXYCODONE-ACETAMINOPHEN 5-325 MG PO TABS   Oral   Take 1 tablet by mouth every 4 (four) hours as needed. Pain           BP 113/85  Pulse 90  Temp 98.2 F (36.8 C)  Resp 20  SpO2 100%  Physical Exam  Nursing note and vitals reviewed. Constitutional: He is oriented to person, place, and time. He appears well-developed and well-nourished. No distress.  HENT:  Head: Normocephalic and atraumatic.  Right Ear: External ear normal.  Left Ear: External ear normal.  Nose: Nose normal.  Mouth/Throat: Oropharynx is clear and moist. No oropharyngeal exudate.  Eyes: Conjunctivae normal and EOM are normal. Pupils are equal, round, and reactive to light.  Neck: Neck supple.  No tracheal deviation present.  Cardiovascular: Normal rate, regular rhythm and normal heart sounds.  Exam reveals no gallop and no friction rub.   No murmur heard. Pulmonary/Chest: Effort normal and breath sounds normal. No respiratory distress. He has no wheezes. He has no rhonchi. He has no rales. He exhibits no tenderness.       Lungs clear anteriorly and posteriorly.   Musculoskeletal: Normal range of motion.  Neurological: He is alert and oriented to person, place, and time.  Skin: Skin is warm and dry.  Psychiatric: He has a normal mood and affect. His behavior is normal.    ED Course  Procedures (including critical care time)  DIAGNOSTIC STUDIES: Oxygen Saturation  is 100% on room air, normal by my interpretation.    COORDINATION OF CARE:  20:45-Discussed planned course of treatment with the patient, who is agreeable at this time.    Labs Reviewed - No data to display No results found.   1. Medication refill   2. Chronic back pain       MDM  37 y/o male with chronic back pain needing his oxycodone. Discussed that I cannot give him rx for oxycodone since he is seen by pain management. Percocet given in ED only. He would like a new pain management clinic. Resources given. Patient states understanding of plan and is agreeable. Stable for discharge.   I personally performed the services described in this documentation, which was scribed in my presence. The recorded information has been reviewed and is accurate.       Trevor Mace, PA-C 03/10/12 2124

## 2012-03-10 NOTE — ED Provider Notes (Signed)
Medical screening examination/treatment/procedure(s) were performed by non-physician practitioner and as supervising physician I was immediately available for consultation/collaboration.   Dione Booze, MD 03/10/12 [phone]

## 2012-10-12 ENCOUNTER — Encounter (HOSPITAL_BASED_OUTPATIENT_CLINIC_OR_DEPARTMENT_OTHER): Payer: Self-pay | Admitting: *Deleted

## 2012-10-12 ENCOUNTER — Emergency Department (HOSPITAL_BASED_OUTPATIENT_CLINIC_OR_DEPARTMENT_OTHER)
Admission: EM | Admit: 2012-10-12 | Discharge: 2012-10-13 | Discharge status: Against Medical Advice | Payer: Self-pay | Attending: Emergency Medicine | Admitting: Emergency Medicine

## 2012-10-12 ENCOUNTER — Emergency Department (HOSPITAL_BASED_OUTPATIENT_CLINIC_OR_DEPARTMENT_OTHER): Payer: Self-pay

## 2012-10-12 DIAGNOSIS — R079 Chest pain, unspecified: Secondary | ICD-10-CM

## 2012-10-12 DIAGNOSIS — Z8739 Personal history of other diseases of the musculoskeletal system and connective tissue: Secondary | ICD-10-CM | POA: Insufficient documentation

## 2012-10-12 DIAGNOSIS — F17200 Nicotine dependence, unspecified, uncomplicated: Secondary | ICD-10-CM | POA: Insufficient documentation

## 2012-10-12 DIAGNOSIS — G8929 Other chronic pain: Secondary | ICD-10-CM | POA: Insufficient documentation

## 2012-10-12 DIAGNOSIS — Z8709 Personal history of other diseases of the respiratory system: Secondary | ICD-10-CM | POA: Insufficient documentation

## 2012-10-12 DIAGNOSIS — J439 Emphysema, unspecified: Secondary | ICD-10-CM

## 2012-10-12 DIAGNOSIS — Z79899 Other long term (current) drug therapy: Secondary | ICD-10-CM | POA: Insufficient documentation

## 2012-10-12 DIAGNOSIS — J438 Other emphysema: Secondary | ICD-10-CM | POA: Insufficient documentation

## 2012-10-12 DIAGNOSIS — M549 Dorsalgia, unspecified: Secondary | ICD-10-CM | POA: Insufficient documentation

## 2012-10-12 DIAGNOSIS — R0789 Other chest pain: Secondary | ICD-10-CM | POA: Insufficient documentation

## 2012-10-12 HISTORY — DX: Dorsalgia, unspecified: M54.9

## 2012-10-12 HISTORY — DX: Other chronic pain: G89.29

## 2012-10-12 HISTORY — DX: Pneumothorax, unspecified: J93.9

## 2012-10-12 HISTORY — DX: Emphysema, unspecified: J43.9

## 2012-10-12 LAB — CBC WITH DIFFERENTIAL/PLATELET
Basophils Absolute: 0.1 10*3/uL (ref 0.0–0.1)
Basophils Relative: 1 % (ref 0–1)
Eosinophils Absolute: 0.1 10*3/uL (ref 0.0–0.7)
HCT: 40.2 % (ref 39.0–52.0)
MCH: 32.4 pg (ref 26.0–34.0)
MCHC: 35.3 g/dL (ref 30.0–36.0)
Monocytes Absolute: 0.5 10*3/uL (ref 0.1–1.0)
Monocytes Relative: 7 % (ref 3–12)
Neutro Abs: 4.1 10*3/uL (ref 1.7–7.7)
RDW: 11.8 % (ref 11.5–15.5)

## 2012-10-12 NOTE — ED Notes (Addendum)
Patient states that he is having chest pain, left chest sharp pain that he feels in his throat. SOB also. Started 4 days ago. Patient was seen at a pain clinic for back pain, patient brought his old prescription bottle so we could see what he used to take.

## 2012-10-12 NOTE — ED Notes (Signed)
Pt sts he has been under increased stress lately due to caring for his mother and sts that the pain is worse when he gets more stressed out.

## 2012-10-13 ENCOUNTER — Encounter (HOSPITAL_BASED_OUTPATIENT_CLINIC_OR_DEPARTMENT_OTHER): Payer: Self-pay | Admitting: Emergency Medicine

## 2012-10-13 LAB — COMPREHENSIVE METABOLIC PANEL
AST: 11 U/L (ref 0–37)
Albumin: 3.9 g/dL (ref 3.5–5.2)
BUN: 9 mg/dL (ref 6–23)
Calcium: 9.8 mg/dL (ref 8.4–10.5)
Chloride: 103 mEq/L (ref 96–112)
Creatinine, Ser: 1.1 mg/dL (ref 0.50–1.35)
Total Bilirubin: 0.4 mg/dL (ref 0.3–1.2)
Total Protein: 6.5 g/dL (ref 6.0–8.3)

## 2012-10-13 LAB — TROPONIN I: Troponin I: <0.3 ng/mL (ref <0.30)

## 2012-10-13 MED ORDER — PANTOPRAZOLE SODIUM 40 MG IV SOLR
40.0000 mg | Freq: Once | INTRAVENOUS | Status: AC
  Administered 2012-10-13: 40 mg via INTRAVENOUS
  Filled 2012-10-13: qty 40

## 2012-10-13 MED ORDER — NITROGLYCERIN 0.4 MG SL SUBL
0.4000 mg | SUBLINGUAL_TABLET | SUBLINGUAL | Status: DC | PRN
  Administered 2012-10-13 (×2): 0.4 mg via SUBLINGUAL
  Filled 2012-10-13: qty 25

## 2012-10-13 MED ORDER — ASPIRIN 81 MG PO CHEW
324.0000 mg | CHEWABLE_TABLET | Freq: Once | ORAL | Status: AC
  Administered 2012-10-13: 324 mg via ORAL
  Filled 2012-10-13: qty 4

## 2012-10-13 NOTE — ED Provider Notes (Signed)
CSN: [id number]     Arrival date & time 10/12/12  2209 History     First MD Initiated Contact with Patient 10/13/12 0048     Chief Complaint  Patient presents with  . Chest Pain   (Consider location/radiation/quality/duration/timing/severity/associated sxs/prior Treatment) HPI This is a 37 year old male whose mother has alpha-1-antitrypsin deficiency and associated emphysema. Although he has never been told he had emphysema a review of his prior x-rays show severe emphysematous changes which are noted to be worse on chest x-ray obtained this visit.  He is here with 4 days of intermittent chest pain. He describes the pain as a squeezing in his upper chest and throat, sometimes with a sharp component. The pain has been episodic lasting several hours of moderate pain but with episodes of severe pain lasting several minutes. There is been shortness of breath associated with the pain, especially the severe pain. He is not normally short of breath nor does he normally has dyspnea on exertion. He denies associated diaphoresis or nausea. He does have chronic back pain but stopped seeing his chronic pain provider due to financial limitations. He denies exertion exacerbating his pain, nor does rest relieve it. He believes it is due to stress.  Past Medical History  Diagnosis Date  . Degenerative disc disease   . Chronic back pain   . Pneumothorax on left    History reviewed. No pertinent past surgical history. No family history on file. History  Substance Use Topics  . Smoking status: Current Every Day Smoker -- 1.50 packs/day    Types: Cigarettes  . Smokeless tobacco: Not on file  . Alcohol Use: No    Review of Systems  All other systems reviewed and are negative.    Allergies  Hydrocodone-acetaminophen; Neurontin; Nsaids; and Tramadol hcl  Home Medications   Current Outpatient Rx  Name  Route  Sig  Dispense  Refill  . acetaminophen (TYLENOL) 500 MG tablet   Oral   Take 1,000 mg  by mouth every 6 (six) hours as needed. For pain.         . clonazePAM (KLONOPIN) 2 MG tablet   Oral   Take 2 mg by mouth 2 (two) times daily as needed. Anxiety         . loperamide (IMODIUM) 2 MG capsule   Oral   Take 2 mg by mouth 4 (four) times daily as needed. For diarrhea.         Marland Kitchen oxyCODONE (ROXICODONE) 15 MG immediate release tablet   Oral   Take 15 mg by mouth every 4 (four) hours as needed. For pain.          BP 130/69  Pulse 83  Temp(Src) 97.8 F (36.6 C) (Oral)  Resp 20  Ht 6\' 2"  (1.88 m)  Wt 150 lb (68.04 kg)  BMI 19.25 kg/m2  SpO2 100%  Physical Exam General: Well-developed, thin male in no acute distress; appearance consistent with age of record HENT: normocephalic, atraumatic Eyes: pupils equal round and reactive to light; extraocular muscles intact Neck: supple Heart: regular rate and rhythm; distant sounds Lungs: clear to auscultation bilaterally with distant sounds Abdomen: soft; nondistended; nontender; no masses or hepatosplenomegaly; bowel sounds present Extremities: No deformity; full range of motion; pulses normal; no edema Neurologic: Awake, alert and oriented; motor function intact in all extremities and symmetric; no facial droop Skin: Warm and dry Psychiatric: Normal mood and affect    ED Course   Procedures (including critical care time)  MDM   Nursing notes and vitals signs, including pulse oximetry, reviewed.  Summary of this visit's results, reviewed by myself:  Labs:  Results for orders placed during the hospital encounter of 10/12/12 (from the past 24 hour(s))  TROPONIN I     Status: None   Collection Time    10/12/12 11:29 PM      Result Value Range   Troponin I <0.30  <0.30 ng/mL  CBC WITH DIFFERENTIAL     Status: None   Collection Time    10/12/12 11:29 PM      Result Value Range   WBC 7.5  4.0 - 10.5 K/uL   RBC 4.38  4.22 - 5.81 MIL/uL   Hemoglobin 14.2  13.0 - 17.0 g/dL   HCT 16.1  09.6 - 04.5 %   MCV  91.8  78.0 - 100.0 fL   MCH 32.4  26.0 - 34.0 pg   MCHC 35.3  30.0 - 36.0 g/dL   RDW 40.9  81.1 - 91.4 %   Platelets 315  150 - 400 K/uL   Neutrophils Relative % 55  43 - 77 %   Neutro Abs 4.1  1.7 - 7.7 K/uL   Lymphocytes Relative 35  12 - 46 %   Lymphs Abs 2.6  0.7 - 4.0 K/uL   Monocytes Relative 7  3 - 12 %   Monocytes Absolute 0.5  0.1 - 1.0 K/uL   Eosinophils Relative 2  0 - 5 %   Eosinophils Absolute 0.1  0.0 - 0.7 K/uL   Basophils Relative 1  0 - 1 %   Basophils Absolute 0.1  0.0 - 0.1 K/uL  COMPREHENSIVE METABOLIC PANEL     Status: Abnormal   Collection Time    10/12/12 11:29 PM      Result Value Range   Sodium 139  135 - 145 mEq/L   Potassium 3.3 (*) 3.5 - 5.1 mEq/L   Chloride 103  96 - 112 mEq/L   CO2 27  19 - 32 mEq/L   Glucose, Bld 76  70 - 99 mg/dL   BUN 9  6 - 23 mg/dL   Creatinine, Ser 7.82  0.50 - 1.35 mg/dL   Calcium 9.8  8.4 - 95.6 mg/dL   Total Protein 6.5  6.0 - 8.3 g/dL   Albumin 3.9  3.5 - 5.2 g/dL   AST 11  0 - 37 U/L   ALT 7  0 - 53 U/L   Alkaline Phosphatase 94  39 - 117 U/L   Total Bilirubin 0.4  0.3 - 1.2 mg/dL   GFR calc non Af Amer 83 (*) >90 mL/min   GFR calc Af Amer >90  >90 mL/min    Imaging Studies: Dg Chest 2 View  10/12/2012   *RADIOLOGY REPORT*  Clinical Data: Lambert Mody left-sided chest pain with shortness of breath.  CHEST - 2 VIEW  Comparison: 03/02/2008  Findings: The patient has severe emphysematous disease with numerous large and small blebs throughout both lungs.  This has progressed.  Heart size and pulmonary vascularity are normal.  No acute osseous abnormality.  IMPRESSION: Severe emphysema, progressed.   Original Report Authenticated By: Francene Boyers, M.D.   EKG Interpretation:  Date & Time: 10/12/2012 10:21 PM  Rate: 86  Rhythm: normal sinus rhythm  QRS Axis: normal  Intervals: normal  ST/T Wave abnormalities: normal  Conduction Disutrbances:none  Narrative Interpretation: biatrial enlargement; poor with progression  Old  EKG Reviewed: none available  1:12 AM Patient  became tearful when advised of his x-ray findings. Given his mother's history it is very likely he has for alpha-1-antitrypsin deficiency himself given his history of pneumothoraces requiring pleurodesis in 2000  2:19 AM The patient has elected to sign out AGAINST MEDICAL ADVICE. He was advised that this could cause further injury or harm including death. He was advised that he is free to return at any time she changes her mind or should his symptoms worsen. 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    Hanley Seamen, MD 10/13/12 [phone]

## 2012-10-13 NOTE — ED Notes (Signed)
After subl nitro x2, pt sts pain is unchanged. Due to BP dropping to 103 systolic, 3rd subl nitro held.

## 2012-10-13 NOTE — ED Notes (Signed)
MD at bedside. 

## 2012-10-13 NOTE — ED Notes (Signed)
Pt ambulated to bathroom and back to his room unassisted and with a steady gait. When I entered his room to place him back on the monitor, pt had gotten himself dressed and stated that he wished to go outside and smoke. I had a lengthy discussion with pt and his mother at bedside and advised him that if he went outside, I would have to remove his IV and sign  him out AMA. Otherwise he would need to stay in the ER and be admitted. Pt stated that he would like to leave and if he felt the need later to be admitted, he would return here or go directly to [facility] ER. Pt is aware that if he leaves and then returns, the entire ER visit/admission process starts over again from the beginning. Pt very cordial and understanding and acknowledges the risks involved with leaving AMA.

## 2012-10-15 LAB — ALPHA-1-ANTITRYPSIN: A-1 Antitrypsin, Ser: 95 mg/dL (ref 90–200)

## 2012-10-16 LAB — ALPHA-1 ANTITRYPSIN PHENOTYPE: A-1 Antitrypsin: 96 mg/dL (ref 83–199)

## 2012-10-25 ENCOUNTER — Emergency Department (HOSPITAL_COMMUNITY)
Admission: EM | Admit: 2012-10-25 | Discharge: 2012-10-25 | Disposition: A | Discharge status: Home / Self Care | Payer: Self-pay | Attending: Emergency Medicine | Admitting: Emergency Medicine

## 2012-10-25 ENCOUNTER — Emergency Department (HOSPITAL_COMMUNITY): Payer: Self-pay

## 2012-10-25 ENCOUNTER — Encounter (HOSPITAL_COMMUNITY): Payer: Self-pay | Admitting: *Deleted

## 2012-10-25 DIAGNOSIS — R079 Chest pain, unspecified: Secondary | ICD-10-CM

## 2012-10-25 DIAGNOSIS — R0789 Other chest pain: Secondary | ICD-10-CM | POA: Insufficient documentation

## 2012-10-25 DIAGNOSIS — J439 Emphysema, unspecified: Secondary | ICD-10-CM

## 2012-10-25 DIAGNOSIS — Z8709 Personal history of other diseases of the respiratory system: Secondary | ICD-10-CM | POA: Insufficient documentation

## 2012-10-25 DIAGNOSIS — J438 Other emphysema: Secondary | ICD-10-CM | POA: Insufficient documentation

## 2012-10-25 DIAGNOSIS — F17200 Nicotine dependence, unspecified, uncomplicated: Secondary | ICD-10-CM | POA: Insufficient documentation

## 2012-10-25 DIAGNOSIS — Z8739 Personal history of other diseases of the musculoskeletal system and connective tissue: Secondary | ICD-10-CM | POA: Insufficient documentation

## 2012-10-25 DIAGNOSIS — Z720 Tobacco use: Secondary | ICD-10-CM

## 2012-10-25 DIAGNOSIS — G8929 Other chronic pain: Secondary | ICD-10-CM | POA: Insufficient documentation

## 2012-10-25 LAB — CBC
HCT: 36.9 % — ABNORMAL LOW (ref 39.0–52.0)
Hemoglobin: 13 g/dL (ref 13.0–17.0)
MCHC: 35.2 g/dL (ref 30.0–36.0)
WBC: 8.6 10*3/uL (ref 4.0–10.5)

## 2012-10-25 LAB — BASIC METABOLIC PANEL
BUN: 6 mg/dL (ref 6–23)
Chloride: 102 mEq/L (ref 96–112)
GFR calc Af Amer: >90 mL/min (ref >90)
GFR calc non Af Amer: >90 mL/min (ref >90)
Potassium: 3.1 mEq/L — ABNORMAL LOW (ref 3.5–5.1)
Sodium: 140 mEq/L (ref 135–145)

## 2012-10-25 LAB — POCT I-STAT TROPONIN I

## 2012-10-25 MED ORDER — ALBUTEROL SULFATE HFA 108 (90 BASE) MCG/ACT IN AERS
2.0000 | INHALATION_SPRAY | RESPIRATORY_TRACT | Status: DC | PRN
  Administered 2012-10-25: 2 via RESPIRATORY_TRACT
  Filled 2012-10-25: qty 6.7

## 2012-10-25 NOTE — ED Notes (Signed)
Pt c/o chest pain for 2 weeks, c/o squeezing and shortness of breath tonight along with the pain

## 2012-10-25 NOTE — ED Provider Notes (Signed)
CSN: [id number]     Arrival date & time 10/25/12  0037 History   First MD Initiated Contact with Patient 10/25/12 0114     Chief Complaint  Patient presents with  . Chest Pain   (Consider location/radiation/quality/duration/timing/severity/associated sxs/prior Treatment) HPI Comments: Patient with a history of Severe Emphysema and FH of alpha 1 antitrypsin deficiency presents today with a chief complaint of chest pain and SOB.  He reports that his symptoms have been present constantly for the past 2 weeks.  Symptoms gradually worsening.  He describes the chest pain as a tightness across his chest.  He reports that it does not radiate.  He has not taken anything for his symptoms.  He denies nausea, vomiting, dizziness, lightheadedness, syncope, numbness, or tingling.  He reports that he has had similar pain in the past.  Denies prolonged travel or surgeries in the past 4 weeks.  Denies history of PE or DVT.  Denies lower extremity edema.  Denies any history of DM, HTN, or Hyperlipidemia.  No prior cardiac history.  He does smoke 1.5 ppd.    The history is provided by the patient.    Past Medical History  Diagnosis Date  . Degenerative disc disease   . Chronic back pain   . Pneumothorax on left   . Emphysema/COPD    History reviewed. No pertinent past surgical history. Family History  Problem Relation Age of Onset  . Alpha-1 antitrypsin deficiency Mother   . Emphysema Mother    History  Substance Use Topics  . Smoking status: Current Every Day Smoker -- 1.50 packs/day    Types: Cigarettes  . Smokeless tobacco: Not on file  . Alcohol Use: No    Review of Systems  Respiratory: Positive for shortness of breath.   Cardiovascular: Positive for chest pain.  All other systems reviewed and are negative.    Allergies  Hydrocodone-acetaminophen; Ibuprofen; Neurontin; Nsaids; and Tramadol hcl  Home Medications   Current Outpatient Rx  Name  Route  Sig  Dispense  Refill  .  acetaminophen (TYLENOL) 500 MG tablet   Oral   Take 1,000 mg by mouth every 6 (six) hours as needed. For pain.         . clonazePAM (KLONOPIN) 2 MG tablet   Oral   Take 2 mg by mouth 2 (two) times daily as needed. Anxiety         . loperamide (IMODIUM) 2 MG capsule   Oral   Take 2 mg by mouth 4 (four) times daily as needed. For diarrhea.         Marland Kitchen oxyCODONE (ROXICODONE) 15 MG immediate release tablet   Oral   Take 15 mg by mouth every 4 (four) hours as needed. For pain.          BP 136/96  Pulse 100  Temp(Src) 97.6 F (36.4 C) (Oral)  Resp 18  SpO2 100% Physical Exam  Nursing note and vitals reviewed. Constitutional: He appears well-developed and well-nourished.  HENT:  Head: Normocephalic and atraumatic.  Mouth/Throat: Oropharynx is clear and moist.  Neck: Normal range of motion. Neck supple.  Cardiovascular: Normal rate, regular rhythm and normal heart sounds.   Pulmonary/Chest: Effort normal and breath sounds normal. No respiratory distress. He has no wheezes. He has no rales. He exhibits tenderness.  Abdominal: Soft. There is no tenderness.  Musculoskeletal: Normal range of motion.  No LE edema  Neurological: He is alert.  Skin: Skin is warm and dry.  Psychiatric: He has  a normal mood and affect.    ED Course  Procedures (including critical care time) Labs Review Labs Reviewed  BASIC METABOLIC PANEL - Abnormal; Notable for the following:    Potassium 3.1 (*)    All other components within normal limits  CBC - Abnormal; Notable for the following:    RBC 3.99 (*)    HCT 36.9 (*)    All other components within normal limits  POCT I-STAT TROPONIN I   Imaging Review Dg Chest 2 View (if Patient Has Fever And/or Copd)  10/25/2012   *RADIOLOGY REPORT*  Clinical Data: Chest pain.  CHEST - 2 VIEW  Comparison: 10/12/2012.  Findings: Severe emphysema, worse at the right apex and left base. The lungs are hyperinflated.  No infiltrate, effusion, or evidence of  pneumothorax. The severe emphysema decreases sensitivity for detecting pneumothorax.  Normal heart size and mediastinal contours.  No acute osseous findings.  IMPRESSION:  1.  No acute change from 10/12/2012. 2.  Severe emphysema.  Given the patient's young age, there may be alpha-1 antitrypsin deficiency.   Original Report Authenticated By: Tiburcio Pea    Date: 10/26/2012  Rate: 83  Rhythm: normal sinus rhythm  QRS Axis: normal  Intervals: normal  ST/T Wave abnormalities: normal  Conduction Disutrbances:none  Narrative Interpretation:   Old EKG Reviewed: unchanged  Patient discussed with Dr. Dierdre Highman.  MDM  No diagnosis found. Patient with a history of severe emphysema and FH of alpha-1 antitrypsin deficiency presents today with chest tightness and SOB that has been present for 2 weeks.  Patient currently smoking 1.5 ppd.  Pulse ox 100 on RA.  No signs of respiratory distress.  NO ischemic changes on EKG.  Troponin negative.  CXR showing severe emphysema.  PERC negative.  Patient stable for discharge.  Patient given follow up with Cardiology and Pulmonology.  Return precautions givne.      Pascal Lux [place], PA-C 10/26/12 [phone]

## 2012-10-25 NOTE — ED Notes (Signed)
Patient is alert and oriented x3.  He was given DC instructions and follow up visit instructions.  Patient gave verbal understanding.  He was DC ambulatory under his own power to home.  V/S stable.  He was not showing any signs of distress on DC 

## 2012-10-26 ENCOUNTER — Telehealth: Payer: Self-pay | Admitting: Pulmonary Disease

## 2012-10-26 NOTE — Telephone Encounter (Signed)
Scheduled per [facility] 11/02/12 2pm--  Chest pain/severe emphysema Pt was tested for Alpha 1 - resulted--95.... Pt was never told what this range meant. Pt was also dx with severe emphysema through previous cxr. Patient aware to bring all hospital paperwork with him to his visit and to arrive btw 130-145 for his appt to fill out paperwork.   Will send to [facility] as FYI

## 2012-10-26 NOTE — ED Provider Notes (Signed)
Medical screening examination/treatment/procedure(s) were performed by non-physician practitioner and as supervising physician I was immediately available for consultation/collaboration.  Sunnie Nielsen, MD 10/26/12 2142

## 2012-11-02 ENCOUNTER — Encounter: Payer: Self-pay | Admitting: Pulmonary Disease

## 2012-11-02 ENCOUNTER — Ambulatory Visit (INDEPENDENT_AMBULATORY_CARE_PROVIDER_SITE_OTHER): Payer: Self-pay | Admitting: Pulmonary Disease

## 2012-11-02 ENCOUNTER — Other Ambulatory Visit: Payer: Self-pay | Admitting: Pulmonary Disease

## 2012-11-02 VITALS — BP 100/72 | HR 98 | Temp 98.5°F | Ht 73.0 in | Wt 139.2 lb

## 2012-11-02 DIAGNOSIS — J449 Chronic obstructive pulmonary disease, unspecified: Secondary | ICD-10-CM

## 2012-11-02 DIAGNOSIS — R0789 Other chest pain: Secondary | ICD-10-CM

## 2012-11-02 DIAGNOSIS — J439 Emphysema, unspecified: Secondary | ICD-10-CM | POA: Insufficient documentation

## 2012-11-02 DIAGNOSIS — R079 Chest pain, unspecified: Secondary | ICD-10-CM

## 2012-11-02 NOTE — Assessment & Plan Note (Signed)
The patient has moderate airflow obstruction on his spirometry today, and it is unclear how much of this is COPD and how much may be reversible from asthmatic bronchitis.  I have stressed to the patient the importance of total smoking cessation, and we'll start him on a LABA/ICS to see how much things improve.  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  This would be a patient that I would consider treatment with replacement therapy as long as it totally quits smoking and we can demonstrate progressive disease.

## 2012-11-02 NOTE — Progress Notes (Signed)
  Subjective:    Patient ID: Brett Beck, male    DOB: 04/03/1975, 37 y.o.   MRN: [id number]  HPI The patient is a 37 year old male who I've been asked to see for possible COPD.  The patient has had atypical chest discomfort for the last 4 weeks, and was recently seen in emergency room where a workup was unremarkable.  A chest x-ray showed various severe hyperinflation and scarring, and the question was raised whether he had emphysema.  He had an alpha-1 antitrypsin level drawn that was at the lower limits of normal, and was MZ.  He has a history of smoking, but only for 12 years.  The patient states that his pain in his chest feels like a bruise, and sometimes sharp and stabbing in nature.  He had normal troponins in the emergency room, as well as a fairly normal EKG.  He has noted significant tachycardia with exertional activities, but does not think that he gets short of breath.  He denies any significant cough or mucus production, and has not had lower extremity edema.  He has no history of cardiac disease or asthma as a child growing up.  He does have a history of a pneumothorax in 1998 and 2000, with a vats intervention in 2000.   Review of Systems  Constitutional: Negative for fever and unexpected weight change.  HENT: Negative for ear pain, nosebleeds, congestion, sore throat, rhinorrhea, sneezing, trouble swallowing, dental problem, postnasal drip and sinus pressure.   Eyes: Negative for redness and itching.  Respiratory: Negative for cough, chest tightness, shortness of breath and wheezing.   Cardiovascular: Positive for chest pain and palpitations ( irregular heartbeats). Negative for leg swelling.  Gastrointestinal: Negative for nausea and vomiting.  Genitourinary: Negative for dysuria.  Musculoskeletal: Negative for joint swelling.  Skin: Negative for rash.  Neurological: Positive for headaches.  Hematological: Does not bruise/bleed easily.  Psychiatric/Behavioral: Negative for  dysphoric mood. The patient is not nervous/anxious.        Objective:   Physical Exam Constitutional:  Thin male, no acute distress  HENT:  Nares patent without discharge, deviated septum to left with narrowing.  Oropharynx without exudate, palate and uvula are normal  Eyes:  Perrla, eomi, no scleral icterus  Neck:  No JVD, no TMG  Cardiovascular:  Normal rate, regular rhythm, no rubs or gallops.  No murmurs        Intact distal pulses  Pulmonary :  Normal breath sounds but diminished, no stridor or respiratory distress   No rales, rhonchi, or wheezing  Abdominal:  Soft, nondistended, bowel sounds present.  No tenderness noted.   Musculoskeletal:  No lower extremity edema noted.  Lymph Nodes:  No cervical lymphadenopathy noted  Skin:  No cyanosis noted  Neurologic:  Alert, appropriate, moves all 4 extremities without obvious deficit.         Assessment & Plan:

## 2012-11-02 NOTE — Patient Instructions (Addendum)
Will start on symbicort 160/4.5  2 inhalations am and pm everyday.  Rinse mouth well after using. Use albuterol for rescue only after sitting down first and resting. Stop smoking.  This is a must for you. Let me know if your chest discomfort worsens, but suspect is not cardiac in origin. We need to get ct chest at some point.  Will see if we can work on coverage.  followup with me in 4 weeks.

## 2012-11-02 NOTE — Assessment & Plan Note (Signed)
The patient's history is not suggestive of cardiac pain.  I suspect his discomfort is either from air trapping, musculoskeletal issues, or possibly an occult pneumothorax that is not being picked up on chest x-ray.  I would like to see how he responds on a bronchodilator regimen.  He needs to have a CT chest, but he does not have insurance currently.  We will work on getting his CT covered.

## 2012-11-03 ENCOUNTER — Telehealth: Payer: Self-pay | Admitting: Pulmonary Disease

## 2012-11-03 NOTE — Telephone Encounter (Signed)
Can take robitussin dm or delsym per directions.  If he is getting worse or has temp > 100.5, let us know.

## 2012-11-03 NOTE — Telephone Encounter (Signed)
Returning call.Brett Beck ° °

## 2012-11-03 NOTE — Telephone Encounter (Signed)
Spoke with the pt and notified of recs per [facility] and he verbalized understanding and denied any questions Nothing further needed

## 2012-11-03 NOTE — Telephone Encounter (Signed)
LMTCB

## 2012-11-03 NOTE — Telephone Encounter (Signed)
Spoke with the pt  He was just seen yesterday for initial eval He states that he woke up early this am and "felt hot"- temp 99.6 and started coughing Cough is new for him, non prod  He has not taken anything otc and does not want to without asking [facility] first Pt denied having any other new co's  Please advise, thanks!

## 2012-11-04 ENCOUNTER — Other Ambulatory Visit: Payer: Self-pay

## 2012-11-16 ENCOUNTER — Other Ambulatory Visit: Payer: Self-pay | Admitting: Pulmonary Disease

## 2012-11-16 DIAGNOSIS — J449 Chronic obstructive pulmonary disease, unspecified: Secondary | ICD-10-CM

## 2012-11-19 ENCOUNTER — Ambulatory Visit
Admission: RE | Admit: 2012-11-19 | Discharge: 2012-11-19 | Disposition: A | Discharge status: Home / Self Care | Payer: No Typology Code available for payment source | Source: Ambulatory Visit | Attending: Pulmonary Disease | Admitting: Pulmonary Disease

## 2012-11-19 ENCOUNTER — Telehealth: Payer: Self-pay | Admitting: Pulmonary Disease

## 2012-11-19 DIAGNOSIS — R0789 Other chest pain: Secondary | ICD-10-CM

## 2012-11-19 NOTE — Telephone Encounter (Signed)
I spoke with pt and advised that [facility] is out of office until Monday and that we will call him with results when he returns and reviews them. Pt states understanding. Also the pt states no need to f/u on pain clinic referral because he called and they did receive our order and are working on getting him an appt. Carron Curie, CMA

## 2012-11-23 ENCOUNTER — Telehealth: Payer: Self-pay | Admitting: Pulmonary Disease

## 2012-11-23 NOTE — Telephone Encounter (Signed)
Pt advised that Dr. Shelle Iron was not in the office end of the week but that I will send him a message advising that the pt is requesting CT results. Please advise.Carron Curie, CMA

## 2012-11-24 NOTE — Progress Notes (Signed)
Quick Note:  Advised pt of CT results per [facility]. Pt verbalized understanding and will keep appt for 11/30/12 to discuss Results in further detail ______

## 2012-11-25 NOTE — Telephone Encounter (Signed)
Pt has been made aware of CT results per 11/24/12 result note. Will sign off message

## 2012-11-30 ENCOUNTER — Encounter: Payer: Self-pay | Admitting: Pulmonary Disease

## 2012-11-30 ENCOUNTER — Ambulatory Visit (INDEPENDENT_AMBULATORY_CARE_PROVIDER_SITE_OTHER): Payer: Self-pay | Admitting: Pulmonary Disease

## 2012-11-30 VITALS — BP 108/70 | HR 97 | Temp 98.4°F | Ht 73.0 in | Wt 139.8 lb

## 2012-11-30 DIAGNOSIS — J439 Emphysema, unspecified: Secondary | ICD-10-CM

## 2012-11-30 DIAGNOSIS — J449 Chronic obstructive pulmonary disease, unspecified: Secondary | ICD-10-CM

## 2012-11-30 NOTE — Assessment & Plan Note (Addendum)
The patient has not seen a big change in his breathing symbicort, but he has continued to smoke.  His CT chest shows severe bullous emphysema, especially on the right side.  It raises the question of whether he would benefit from lung reduction surgery/bullectomy.  Currently he has no insurance, but they are hoping to get this by January.  I will refer him to [facility] at that time.  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  Given his extensive emphysema on his CT chest, I would treat him with replacement therapy if he is able to quit smoking.  He is continuing to have chest discomfort, but I am not convinced that it is coming from his air trapping and bullous lung disease.  He is extremely tachycardic with any activity, but does not desaturate.  I think he needs to at least have a cardiac evaluation to make sure there is not something else going on, especially with his extensive tobacco use history.

## 2012-11-30 NOTE — Progress Notes (Signed)
  Subjective:    Patient ID: Brett Beck, male    DOB: Mar 19, 1975, 37 y.o.   MRN: [id number]  HPI The patient comes in today for followup of his known COPD.  He was started on symbicort at the last visit, but really has not seen a big change in his breathing.  Unfortunately he continues to smoke, and I have explained this will propagate his airway inflammation.  He has had a CT of his chest because of his chest discomfort, and he was found to have severe was emphysema with extensive lung destruction on the right.  I have reviewed this with both he and his mother.   Review of Systems  Constitutional: Negative for fever and unexpected weight change.  HENT: Positive for congestion. Negative for ear pain, nosebleeds, sore throat, rhinorrhea, sneezing, trouble swallowing, dental problem, postnasal drip and sinus pressure.   Eyes: Negative for redness and itching.  Respiratory: Positive for cough, chest tightness and shortness of breath. Negative for wheezing.   Cardiovascular: Positive for chest pain. Negative for palpitations and leg swelling.  Gastrointestinal: Negative for nausea and vomiting.  Genitourinary: Negative for dysuria.  Musculoskeletal: Negative for joint swelling.  Skin: Negative for rash.  Neurological: Negative for headaches.  Hematological: Does not bruise/bleed easily.  Psychiatric/Behavioral: Negative for dysphoric mood. The patient is not nervous/anxious.        Objective:   Physical Exam Thin male in no acute distress Nose without purulence or discharge noted Neck without lymphadenopathy or thyromegaly Chest with decreased breath sounds throughout, no wheezing Cardiac exam with regular rate and rhythm Lower extremities without edema, no cyanosis Alert and oriented, moves all 4 extremities.        Assessment & Plan:

## 2012-11-30 NOTE — Patient Instructions (Addendum)
Stay on symbicort. You must stop smoking in order to live! Will refer you to cardiology for your rapid heart rate/chest discomfort, although this may not be a cardiac issue.  Will consider referral to [facility] for consideration of lung surgery to possibly help your breathing.  followup with me in 3 mos.

## 2012-12-01 ENCOUNTER — Ambulatory Visit: Payer: Self-pay | Admitting: Cardiology

## 2013-03-02 ENCOUNTER — Ambulatory Visit: Payer: Self-pay | Admitting: Pulmonary Disease

## 2013-04-03 ENCOUNTER — Encounter (HOSPITAL_BASED_OUTPATIENT_CLINIC_OR_DEPARTMENT_OTHER): Payer: Self-pay | Admitting: Emergency Medicine

## 2013-04-03 DIAGNOSIS — Z79899 Other long term (current) drug therapy: Secondary | ICD-10-CM | POA: Insufficient documentation

## 2013-04-03 DIAGNOSIS — J438 Other emphysema: Secondary | ICD-10-CM | POA: Insufficient documentation

## 2013-04-03 DIAGNOSIS — Z8739 Personal history of other diseases of the musculoskeletal system and connective tissue: Secondary | ICD-10-CM | POA: Insufficient documentation

## 2013-04-03 DIAGNOSIS — G8929 Other chronic pain: Secondary | ICD-10-CM | POA: Insufficient documentation

## 2013-04-03 DIAGNOSIS — F17200 Nicotine dependence, unspecified, uncomplicated: Secondary | ICD-10-CM | POA: Insufficient documentation

## 2013-04-03 DIAGNOSIS — Z8709 Personal history of other diseases of the respiratory system: Secondary | ICD-10-CM | POA: Insufficient documentation

## 2013-04-03 DIAGNOSIS — A088 Other specified intestinal infections: Secondary | ICD-10-CM | POA: Insufficient documentation

## 2013-04-03 NOTE — ED Notes (Addendum)
Pt reports vomiting, diarhhea, abd pain since 1 am - unable to tolerate PO's - reports chronic back pain. States he was taken via EMS to [facility] and [facility] due to wait time. States he is "in between pain management clinics."

## 2013-04-04 ENCOUNTER — Emergency Department (HOSPITAL_BASED_OUTPATIENT_CLINIC_OR_DEPARTMENT_OTHER)
Admission: EM | Admit: 2013-04-04 | Discharge: 2013-04-04 | Disposition: A | Discharge status: Home / Self Care | Payer: BC Managed Care – PPO | Attending: Emergency Medicine | Admitting: Emergency Medicine

## 2013-04-04 DIAGNOSIS — A084 Viral intestinal infection, unspecified: Secondary | ICD-10-CM

## 2013-04-04 MED ORDER — OXYCODONE-ACETAMINOPHEN 5-325 MG PO TABS
2.0000 | ORAL_TABLET | Freq: Once | ORAL | Status: AC
Start: 2013-04-04 — End: 2013-04-04
  Administered 2013-04-04: 2 via ORAL
  Filled 2013-04-04: qty 2

## 2013-04-04 MED ORDER — HYDROMORPHONE HCL PF 1 MG/ML IJ SOLN
1.0000 mg | Freq: Once | INTRAMUSCULAR | Status: AC
  Administered 2013-04-04: 1 mg via INTRAVENOUS
  Filled 2013-04-04: qty 1

## 2013-04-04 MED ORDER — LORAZEPAM 2 MG/ML IJ SOLN
1.0000 mg | Freq: Once | INTRAMUSCULAR | Status: AC
  Administered 2013-04-04: 1 mg via INTRAVENOUS
  Filled 2013-04-04: qty 1

## 2013-04-04 MED ORDER — ONDANSETRON HCL 4 MG/2ML IJ SOLN
4.0000 mg | Freq: Once | INTRAMUSCULAR | Status: AC
  Administered 2013-04-04: 4 mg via INTRAVENOUS
  Filled 2013-04-04: qty 2

## 2013-04-04 MED ORDER — ONDANSETRON 8 MG PO TBDP: 8.0000 mg | ORAL_TABLET | Freq: Three times a day (TID) | ORAL | Status: DC | PRN

## 2013-04-04 MED ORDER — SODIUM CHLORIDE 0.9 % IV BOLUS (SEPSIS)
1000.0000 mL | Freq: Once | INTRAVENOUS | Status: AC
  Administered 2013-04-04: 1000 mL via INTRAVENOUS

## 2013-04-04 NOTE — ED Notes (Signed)
Pt in waiting room waiting for cab to take him and his mother home.

## 2013-04-04 NOTE — ED Notes (Signed)
Pt reports he vomited "12 times" since arrival to ed. Has emesis bag at bedside with approx 100cc yellow emesis.

## 2013-04-04 NOTE — ED Provider Notes (Signed)
CSN: [id number]     Arrival date & time 04/03/13  2136 History   First MD Initiated Contact with Patient 04/04/13 0202     Chief Complaint  Patient presents with  . N/V/D    (Consider location/radiation/quality/duration/timing/severity/associated sxs/prior Treatment) HPI This is a 38 year old male with a history of chronic back pain on narcotics. He is here with abdominal cramping and diarrhea which began the night before last. Yesterday morning he began vomiting. He states that the symptoms have been persistent and he has had several episodes of emesis and diarrhea while waiting to be seen in the ED. He states his abdominal cramping is severe. He is also feeling anxiety as he has not been able to take his usual Klonopin. His mother is here with similar symptoms.   Past Medical History  Diagnosis Date  . Degenerative disc disease   . Chronic back pain   . Pneumothorax on left   . Emphysema/COPD    Past Surgical History  Procedure Laterality Date  . Lung surgery      Dr Arlyce Dice-- left lung  . Lung inflation      left lung  . Nose surgery      broken nose   Family History  Problem Relation Age of Onset  . Alpha-1 antitrypsin deficiency Mother   . Emphysema Mother   . Asthma Mother    History  Substance Use Topics  . Smoking status: Current Every Day Smoker -- 0.50 packs/day for 12 years    Types: Cigarettes  . Smokeless tobacco: Not on file  . Alcohol Use: No    Review of Systems  All other systems reviewed and are negative.    Allergies  Effexor; Hydrocodone-acetaminophen; Ibuprofen; Lyrica; Neurontin; Nsaids; and Tramadol hcl  Home Medications   Current Outpatient Rx  Name  Route  Sig  Dispense  Refill  . oxyCODONE-acetaminophen (PERCOCET) 7.5-325 MG per tablet   Oral   Take 1 tablet by mouth every 4 (four) hours as needed for pain.         Marland Kitchen acetaminophen (TYLENOL) 500 MG tablet   Oral   Take 1,000 mg by mouth every 6 (six) hours as needed. For pain.        Marland Kitchen albuterol (PROVENTIL HFA;VENTOLIN HFA) 108 (90 BASE) MCG/ACT inhaler   Inhalation   Inhale 2 puffs into the lungs every 6 (six) hours as needed for wheezing.         . clonazePAM (KLONOPIN) 2 MG tablet   Oral   Take 2 mg by mouth 2 (two) times daily as needed. Anxiety         . loperamide (IMODIUM) 2 MG capsule   Oral   Take 2 mg by mouth 4 (four) times daily as needed. For diarrhea.          BP 116/89  Pulse 89  Temp(Src) 97.3 F (36.3 C) (Oral)  Resp 22  Ht 6\' 2"  (1.88 m)  Wt 150 lb (68.04 kg)  BMI 19.25 kg/m2  SpO2 100%  Physical Exam General: Well-developed, well-nourished male in no acute distress; appearance consistent with age of record HENT: normocephalic; atraumatic; edentulous Eyes: pupils equal, round and reactive to light; extraocular muscles intact Neck: supple Heart: regular rate and rhythm; no murmurs, rubs or gallops Lungs: clear to auscultation bilaterally Abdomen: soft; nondistended; diffusely tender; no masses or hepatosplenomegaly; bowel sounds present Extremities: No deformity; full range of motion; pulses normal Neurologic: Awake, alert and oriented; motor function intact in all extremities  and symmetric; no facial droop Skin: Warm and dry Psychiatric: Flat affect    ED Course  Procedures (including critical care time)    MDM  4:16 AM Patient feels significantly better after IV fluid bolus and IV medications. He will followup with his primary care physician today for refill on his Percocet.    Wynetta Fines, MD 04/04/13 [phone]

## 2013-04-26 ENCOUNTER — Telehealth: Payer: Self-pay | Admitting: Pulmonary Disease

## 2013-04-26 MED ORDER — BUDESONIDE-FORMOTEROL FUMARATE 160-4.5 MCG/ACT IN AERO: 2.0000 | INHALATION_SPRAY | Freq: Two times a day (BID) | RESPIRATORY_TRACT | Status: DC

## 2013-04-26 NOTE — Telephone Encounter (Signed)
Rx has been sent in. The number we have on file for the is not valid per the recording I received. This is the only number we have for him.

## 2013-08-03 ENCOUNTER — Emergency Department (HOSPITAL_COMMUNITY)
Admission: EM | Admit: 2013-08-03 | Discharge: 2013-08-04 | Disposition: A | Discharge status: Home / Self Care | Payer: BC Managed Care – PPO | Attending: Emergency Medicine | Admitting: Emergency Medicine

## 2013-08-03 ENCOUNTER — Encounter (HOSPITAL_COMMUNITY): Payer: Self-pay | Admitting: Emergency Medicine

## 2013-08-03 ENCOUNTER — Emergency Department (HOSPITAL_COMMUNITY): Payer: BC Managed Care – PPO

## 2013-08-03 DIAGNOSIS — R079 Chest pain, unspecified: Secondary | ICD-10-CM | POA: Insufficient documentation

## 2013-08-03 DIAGNOSIS — G8929 Other chronic pain: Secondary | ICD-10-CM | POA: Insufficient documentation

## 2013-08-03 DIAGNOSIS — IMO0002 Reserved for concepts with insufficient information to code with codable children: Secondary | ICD-10-CM | POA: Insufficient documentation

## 2013-08-03 DIAGNOSIS — F411 Generalized anxiety disorder: Secondary | ICD-10-CM | POA: Insufficient documentation

## 2013-08-03 DIAGNOSIS — F17200 Nicotine dependence, unspecified, uncomplicated: Secondary | ICD-10-CM | POA: Insufficient documentation

## 2013-08-03 DIAGNOSIS — J441 Chronic obstructive pulmonary disease with (acute) exacerbation: Secondary | ICD-10-CM | POA: Insufficient documentation

## 2013-08-03 DIAGNOSIS — Z9889 Other specified postprocedural states: Secondary | ICD-10-CM | POA: Insufficient documentation

## 2013-08-03 DIAGNOSIS — J449 Chronic obstructive pulmonary disease, unspecified: Secondary | ICD-10-CM

## 2013-08-03 DIAGNOSIS — F419 Anxiety disorder, unspecified: Secondary | ICD-10-CM

## 2013-08-03 LAB — BASIC METABOLIC PANEL
BUN: 7 mg/dL (ref 6–23)
CHLORIDE: 101 meq/L (ref 96–112)
CO2: 27 meq/L (ref 19–32)
Calcium: 9.9 mg/dL (ref 8.4–10.5)
Creatinine, Ser: 1.11 mg/dL (ref 0.50–1.35)
GFR calc Af Amer: >90 mL/min (ref >90)
GFR calc non Af Amer: 82 mL/min — ABNORMAL LOW (ref >90)
Glucose, Bld: 108 mg/dL — ABNORMAL HIGH (ref 70–99)
Potassium: 4.5 mEq/L (ref 3.7–5.3)
SODIUM: 141 meq/L (ref 137–147)

## 2013-08-03 LAB — CBC
HCT: 43.1 % (ref 39.0–52.0)
Hemoglobin: 15.1 g/dL (ref 13.0–17.0)
MCH: 32.5 pg (ref 26.0–34.0)
MCHC: 35 g/dL (ref 30.0–36.0)
MCV: 92.7 fL (ref 78.0–100.0)
PLATELETS: 369 10*3/uL (ref 150–400)
RBC: 4.65 MIL/uL (ref 4.22–5.81)
RDW: 13.1 % (ref 11.5–15.5)
WBC: 9.1 10*3/uL (ref 4.0–10.5)

## 2013-08-03 LAB — I-STAT TROPONIN, ED: TROPONIN I, POC: 0 ng/mL (ref 0.00–0.08)

## 2013-08-03 MED ORDER — KETOROLAC TROMETHAMINE 30 MG/ML IJ SOLN
30.0000 mg | Freq: Once | INTRAMUSCULAR | Status: AC
  Administered 2013-08-04: 30 mg via INTRAVENOUS
  Filled 2013-08-03: qty 1

## 2013-08-03 NOTE — ED Provider Notes (Addendum)
CSN: [id number]     Arrival date & time 08/03/13  2151 History   First MD Initiated Contact with Patient 08/03/13 2302     Chief Complaint  Patient presents with  . Chest Pain  . Anxiety  . Shortness of Breath     (Consider location/radiation/quality/duration/timing/severity/associated sxs/prior Treatment) Patient is a 38 y.o. male presenting with chest pain, anxiety, and shortness of breath. The history is provided by the patient.  Chest Pain Pain location:  L chest Pain quality: sharp   Pain radiates to:  Does not radiate Pain severity:  Moderate Onset quality:  Gradual Timing:  Constant Progression:  Unchanged Chronicity:  Chronic Context: not eating   Relieved by:  Nothing Worsened by:  Nothing tried Ineffective treatments:  None tried Associated symptoms: anxiety and shortness of breath   Associated symptoms: no fever and no palpitations   Risk factors: no aortic disease, no immobilization and no surgery   Anxiety Associated symptoms include chest pain and shortness of breath.  Shortness of Breath Associated symptoms: chest pain   Associated symptoms: no fever and no wheezing   No cough no hemoptysis no long car trips or plane trips.  No swelling or pain in the legs  Past Medical History  Diagnosis Date  . Degenerative disc disease   . Chronic back pain   . Pneumothorax on left   . Emphysema/COPD    Past Surgical History  Procedure Laterality Date  . Lung surgery      Dr Arlyce Dice-- left lung  . Lung inflation      left lung  . Nose surgery      broken nose   Family History  Problem Relation Age of Onset  . Alpha-1 antitrypsin deficiency Mother   . Emphysema Mother   . Asthma Mother    History  Substance Use Topics  . Smoking status: Current Every Day Smoker -- 0.50 packs/day for 12 years    Types: Cigarettes  . Smokeless tobacco: Not on file  . Alcohol Use: No    Review of Systems  Constitutional: Negative for fever.  Respiratory: Positive for  shortness of breath. Negative for wheezing.   Cardiovascular: Positive for chest pain. Negative for palpitations and leg swelling.  Psychiatric/Behavioral: Negative for suicidal ideas. The patient is nervous/anxious.   All other systems reviewed and are negative.     Allergies  Effexor; Hydrocodone-acetaminophen; Ibuprofen; Lyrica; Neurontin; Nsaids; and Tramadol hcl  Home Medications   Prior to Admission medications   Medication Sig Start Date End Date Taking? Authorizing Provider  acetaminophen (TYLENOL) 500 MG tablet Take 1,000 mg by mouth every 6 (six) hours as needed. For pain.   Yes Historical Provider, MD  clonazePAM (KLONOPIN) 2 MG tablet Take 2 mg by mouth 2 (two) times daily as needed. Anxiety   Yes Historical Provider, MD  oxyCODONE (ROXICODONE) 15 MG immediate release tablet Take 15 mg by mouth every 8 (eight) hours as needed for pain.   Yes Historical Provider, MD  albuterol (PROVENTIL HFA;VENTOLIN HFA) 108 (90 BASE) MCG/ACT inhaler Inhale 2 puffs into the lungs every 6 (six) hours as needed for wheezing.    Historical Provider, MD   BP 114/77  Pulse 107  Temp(Src) 98.2 F (36.8 C) (Oral)  Resp 20  SpO2 98% Physical Exam  Constitutional: He is oriented to person, place, and time. He appears well-developed and well-nourished. No distress.  HENT:  Head: Normocephalic and atraumatic.  Mouth/Throat: Oropharynx is clear and moist.  Eyes: Conjunctivae are  normal. Pupils are equal, round, and reactive to light.  Neck: Normal range of motion. Neck supple.  Cardiovascular: Normal rate, regular rhythm and intact distal pulses.   Pulmonary/Chest: Effort normal and breath sounds normal. He has no wheezes. He has no rales. He exhibits tenderness.  Abdominal: Soft. Bowel sounds are normal. There is no tenderness. There is no rebound and no guarding.  Musculoskeletal: He exhibits no edema and no tenderness.  Neurological: He is alert and oriented to person, place, and time. He has  normal reflexes.  Skin: Skin is warm and dry.  Psychiatric: He has a normal mood and affect. His mood appears not anxious. His affect is not labile. His speech is not rapid and/or pressured. He is not agitated. He does not exhibit a depressed mood. He expresses no suicidal ideation.    ED Course  Procedures (including critical care time) Labs Review Labs Reviewed  BASIC METABOLIC PANEL - Abnormal; Notable for the following:    Glucose, Bld 108 (*)    GFR calc non Af Amer 82 (*)    All other components within normal limits  CBC  I-STAT TROPOININ, ED    Imaging Review No results found.   EKG Interpretation   Date/Time:  Wednesday August 03 2013 23:18:59 EDT Ventricular Rate:  69 PR Interval:  226 QRS Duration: 77 QT Interval:  406 QTC Calculation: 435 R Axis:   85 Text Interpretation:  Sinus rhythm Prolonged PR interval Confirmed by  [facility]  MD, Steffani Dionisio ([id number]) on 08/03/2013 11:41:15 PM      MDM   Final diagnoses:  None  Patient stated he was not allergic to NSAIDs they just upset his stomach,  EDP explained we would be giving ketorlac IV for pain.  As pain is along the costochondral border and with negative enzymes and EKG this is not cardiac. Patient stated he did not think it was cardiac but from his COPD.     PERC negative wells 0 doubt PE.  Doubt ACS troponin is negative and symptoms are ongoing.  Suspect anxiety with superimposed COPD.  Will treat with doxycycline, inhaler and prednisone follow up with pulmonology this week.  Return for any worsening symptoms.  Patient verbalizes understanding and agrees to follow up   At discharge nurse states patient will not sign as he has not had his "behavioral issues" addressed.  Patient is now stating we have addressed anxiety which he needs treated.  As there was no mention of this during exam and history EDP asked what we can do and he stated nothing at this time.  Outpatient resources given.    Carlisle Beers,  MD 08/04/13 0039  Noelle Sease K Melisia Leming-Rasch, MD 08/04/13 [phone]

## 2013-08-03 NOTE — ED Notes (Signed)
Pt presents with left sided non-radiating chest pain. States he recently got diagnosed with emphysema, endorses everyday half pack cigarette smoking, adds that he is going through "behavioural" symptoms specifically depression and anxiety which he states may have contributed to the chest pain. Pt in NAD at this time.

## 2013-08-04 MED ORDER — IPRATROPIUM-ALBUTEROL 18-103 MCG/ACT IN AERO: 2.0000 | INHALATION_SPRAY | RESPIRATORY_TRACT | Status: DC | PRN

## 2013-08-04 MED ORDER — PREDNISONE 20 MG PO TABS: ORAL_TABLET | ORAL | Status: DC

## 2013-08-04 MED ORDER — DOXYCYCLINE HYCLATE 100 MG PO CAPS
100.0000 mg | ORAL_CAPSULE | Freq: Two times a day (BID) | ORAL | Status: DC
Start: 2013-08-04 — End: 2014-12-17

## 2013-08-04 NOTE — ED Notes (Signed)
Pt states he is frustrated that the care team did not address his "behavioural" complaint.  Pt also tates that he received Toradol which is and NSAID while he had remarked on being allergic to NDAIDs stating they give him abdominal cramping. Pt was educated by MD on fact that Toradol IV bypasses abdominal absorption and abdominal cramping would as well be bypassed by this route of med administration. RN has listened and addressed both complaints by reiterating MD'S education with nursing education, and additionally requested MD to step in the room prior to discharge to address pt's concerns. MD has also given pt behavioral community based resources for referral for extensive work on the behavioral complaint as the ED focus on Emergent and life threatening complaints.Pt also advised by RN to come back should the symptoms persist.

## 2013-08-04 NOTE — Discharge Instructions (Signed)
Chronic Obstructive Pulmonary Disease Chronic obstructive pulmonary disease (COPD) is a common lung problem. In COPD, the flow of air from the lungs is limited. The way your lungs work will probably never return to normal, but there are things you can do to improve you lungs and make yourself feel better. HOME CARE  Take all medicines as told by your doctor.  Only take over-the-counter or prescription medicines as told by your doctor.  Avoid medicines or cough syrups that dry up your airway (such as antihistamines) and do not allow you to get rid of thick spit. You do not need to avoid them if told differently by your doctor.  If you smoke, stop. Smoking makes the problem worse.  Avoid being around things that make your breathing worse (like smoke, chemicals, and fumes).  Use oxygen therapy and therapy to help improve your lungs (pulmonary rehabilitation) if told by your doctor. If you need home oxygen therapy, ask your doctor if you should buy a tool to measure your oxygen level (oximeter).  Avoid people who have a sickness you can catch (contagious).  Avoid going outside when it is very hot, cold, or humid.  Eat healthy foods. Eat smaller meals more often. Rest before meals.  Stay active, but remember to also rest.  Make sure to get all the shots (vaccines) your doctor recommends. Ask your doctor if you need a pneumonia shot.  Learn and use tips on how to relax.  Learn and use tips on how to control your breathing as told by your doctor. Try:  Breathing in (inhaling) through your nose for 1 second. Then, pucker your lips and breath out (exhale) through your lips for 2 seconds.  Putting one hand on your belly (abdomen). Breathe in slowly through your nose for 1 second. Your hand on your belly should move out. Pucker your lips and breathe out slowly through your lips. Your hand on your belly should move in as you breathe out.  Learn and use controlled coughing to clear thick spit  from your lungs. 1. Lean your head a little forward. 2. Breathe in deeply. 3. Try to hold your breath for 3 seconds. 4. Keep your mouth slightly open while coughing 2 times. 5. Spit any thick spit out into a tissue. 6. Rest and do the steps again 1 or 2 times as needed. GET HELP IF:  You cough up more thick spit than usual.  There is a change in the color or thickness of the spit.  It is harder to breathe than usual.  Your breathing is faster than usual. GET HELP RIGHT AWAY IF:   You have shortness of breath while resting.  You have shortness of breath that stops you from:  Being able to talk.  Doing normal activities.  You chest hurts for longer than 5 minutes.  Your skin color is more blue than usual.  Your pulse oximeter shows that you have low oxygen for longer than 5 minutes. MAKE SURE YOU:   Understand these instructions.  Will watch your condition.  Will get help right away if you are not doing well or get worse. Document Released: 07/30/2007 Document Revised: 12/01/2012 Document Reviewed: 10/07/2012 Exit[employer] Patient Information 2014 [employer].  

## 2013-08-22 ENCOUNTER — Emergency Department (HOSPITAL_COMMUNITY)
Admission: EM | Admit: 2013-08-22 | Discharge: 2013-08-23 | Disposition: A | Discharge status: Home / Self Care | Payer: BC Managed Care – PPO | Attending: Emergency Medicine | Admitting: Emergency Medicine

## 2013-08-22 ENCOUNTER — Encounter (HOSPITAL_COMMUNITY): Payer: Self-pay | Admitting: Emergency Medicine

## 2013-08-22 DIAGNOSIS — R259 Unspecified abnormal involuntary movements: Secondary | ICD-10-CM | POA: Insufficient documentation

## 2013-08-22 DIAGNOSIS — J438 Other emphysema: Secondary | ICD-10-CM | POA: Insufficient documentation

## 2013-08-22 DIAGNOSIS — Z79899 Other long term (current) drug therapy: Secondary | ICD-10-CM | POA: Insufficient documentation

## 2013-08-22 DIAGNOSIS — F329 Major depressive disorder, single episode, unspecified: Secondary | ICD-10-CM | POA: Insufficient documentation

## 2013-08-22 DIAGNOSIS — IMO0001 Reserved for inherently not codable concepts without codable children: Secondary | ICD-10-CM | POA: Insufficient documentation

## 2013-08-22 DIAGNOSIS — Z792 Long term (current) use of antibiotics: Secondary | ICD-10-CM | POA: Insufficient documentation

## 2013-08-22 DIAGNOSIS — F19939 Other psychoactive substance use, unspecified with withdrawal, unspecified: Secondary | ICD-10-CM | POA: Insufficient documentation

## 2013-08-22 DIAGNOSIS — F1123 Opioid dependence with withdrawal: Secondary | ICD-10-CM

## 2013-08-22 DIAGNOSIS — F3289 Other specified depressive episodes: Secondary | ICD-10-CM | POA: Insufficient documentation

## 2013-08-22 DIAGNOSIS — G8929 Other chronic pain: Secondary | ICD-10-CM | POA: Insufficient documentation

## 2013-08-22 DIAGNOSIS — F1120 Opioid dependence, uncomplicated: Secondary | ICD-10-CM | POA: Insufficient documentation

## 2013-08-22 DIAGNOSIS — F17200 Nicotine dependence, unspecified, uncomplicated: Secondary | ICD-10-CM | POA: Insufficient documentation

## 2013-08-22 DIAGNOSIS — IMO0002 Reserved for concepts with insufficient information to code with codable children: Secondary | ICD-10-CM | POA: Insufficient documentation

## 2013-08-22 DIAGNOSIS — Z8709 Personal history of other diseases of the respiratory system: Secondary | ICD-10-CM | POA: Insufficient documentation

## 2013-08-22 DIAGNOSIS — R197 Diarrhea, unspecified: Secondary | ICD-10-CM | POA: Insufficient documentation

## 2013-08-22 LAB — ACETAMINOPHEN LEVEL: Acetaminophen (Tylenol), Serum: <15 ug/mL (ref 10–30)

## 2013-08-22 LAB — RAPID URINE DRUG SCREEN, HOSP PERFORMED
Amphetamines: NOT DETECTED
Barbiturates: NOT DETECTED
Benzodiazepines: NOT DETECTED
Cocaine: NOT DETECTED
Opiates: POSITIVE — AB
Tetrahydrocannabinol: NOT DETECTED

## 2013-08-22 LAB — COMPREHENSIVE METABOLIC PANEL
ALT: 12 U/L (ref 0–53)
AST: 15 U/L (ref 0–37)
Albumin: 4.9 g/dL (ref 3.5–5.2)
Alkaline Phosphatase: 109 U/L (ref 39–117)
BILIRUBIN TOTAL: 0.6 mg/dL (ref 0.3–1.2)
BUN: 10 mg/dL (ref 6–23)
CHLORIDE: 102 meq/L (ref 96–112)
CO2: 27 mEq/L (ref 19–32)
Calcium: 10.2 mg/dL (ref 8.4–10.5)
Creatinine, Ser: 1.02 mg/dL (ref 0.50–1.35)
GFR calc Af Amer: >90 mL/min (ref >90)
GFR calc non Af Amer: >90 mL/min (ref >90)
Glucose, Bld: 132 mg/dL — ABNORMAL HIGH (ref 70–99)
Potassium: 5.2 mEq/L (ref 3.7–5.3)
Sodium: 143 mEq/L (ref 137–147)
Total Protein: 8.1 g/dL (ref 6.0–8.3)

## 2013-08-22 LAB — CBC
HEMATOCRIT: 47.1 % (ref 39.0–52.0)
Hemoglobin: 16.8 g/dL (ref 13.0–17.0)
MCH: 32.9 pg (ref 26.0–34.0)
MCHC: 35.7 g/dL (ref 30.0–36.0)
MCV: 92.2 fL (ref 78.0–100.0)
Platelets: 398 10*3/uL (ref 150–400)
RBC: 5.11 MIL/uL (ref 4.22–5.81)
RDW: 12.7 % (ref 11.5–15.5)
WBC: 11.1 10*3/uL — AB (ref 4.0–10.5)

## 2013-08-22 LAB — ETHANOL: Alcohol, Ethyl (B): <11 mg/dL (ref 0–11)

## 2013-08-22 LAB — SALICYLATE LEVEL: Salicylate Lvl: <2 mg/dL — ABNORMAL LOW (ref 2.8–20.0)

## 2013-08-22 MED ORDER — LOPERAMIDE HCL 2 MG PO CAPS: 2.0000 mg | ORAL_CAPSULE | ORAL | Status: DC | PRN

## 2013-08-22 MED ORDER — NICOTINE 21 MG/24HR TD PT24
21.0000 mg | MEDICATED_PATCH | Freq: Every day | TRANSDERMAL | Status: DC
  Administered 2013-08-23: 21 mg via TRANSDERMAL
  Filled 2013-08-22: qty 1

## 2013-08-22 MED ORDER — ONDANSETRON 4 MG PO TBDP
4.0000 mg | ORAL_TABLET | Freq: Four times a day (QID) | ORAL | Status: DC | PRN
  Administered 2013-08-23: 4 mg via ORAL
  Filled 2013-08-22: qty 1

## 2013-08-22 MED ORDER — CLONIDINE HCL 0.1 MG PO TABS: 0.1000 mg | ORAL_TABLET | Freq: Every day | ORAL | Status: DC

## 2013-08-22 MED ORDER — CLONIDINE HCL 0.1 MG PO TABS
0.1000 mg | ORAL_TABLET | ORAL | Status: DC
Start: 2013-08-25 — End: 2013-08-23

## 2013-08-22 MED ORDER — HYDROXYZINE HCL 25 MG PO TABS
25.0000 mg | ORAL_TABLET | Freq: Four times a day (QID) | ORAL | Status: DC | PRN
  Administered 2013-08-23: 25 mg via ORAL
  Filled 2013-08-22: qty 1

## 2013-08-22 MED ORDER — NAPROXEN 500 MG PO TABS: 500.0000 mg | ORAL_TABLET | Freq: Two times a day (BID) | ORAL | Status: DC | PRN

## 2013-08-22 MED ORDER — ALUM & MAG HYDROXIDE-SIMETH 200-200-20 MG/5ML PO SUSP: 30.0000 mL | ORAL | Status: DC | PRN

## 2013-08-22 MED ORDER — CLONIDINE HCL 0.1 MG PO TABS
0.1000 mg | ORAL_TABLET | Freq: Four times a day (QID) | ORAL | Status: DC
  Filled 2013-08-22: qty 1

## 2013-08-22 MED ORDER — METHOCARBAMOL 500 MG PO TABS: 500.0000 mg | ORAL_TABLET | Freq: Three times a day (TID) | ORAL | Status: DC | PRN

## 2013-08-22 MED ORDER — DICYCLOMINE HCL 20 MG PO TABS
20.0000 mg | ORAL_TABLET | Freq: Four times a day (QID) | ORAL | Status: DC | PRN
  Administered 2013-08-23 (×2): 20 mg via ORAL
  Filled 2013-08-22 (×2): qty 1

## 2013-08-22 NOTE — ED Notes (Signed)
Bed: CV01 Expected date:  Expected time:  Means of arrival:  Comments: Tr3

## 2013-08-22 NOTE — ED Provider Notes (Signed)
CSN: [id number]     Arrival date & time 08/22/13  2046 History  This chart was scribed for non-physician practitioner, Hazel Sams, PA-C working with Neta Ehlers, MD by Einar Pheasant, ED scribe. This patient was seen in room [facility]TR3/[facility]PT3 and the patient's care was started at 11:10 PM.    Chief Complaint  Patient presents with  . opaite detox    The history is provided by the patient. No language interpreter was used.   HPI Comments: Brett Beck is a 38 y.o. male who presents to the Emergency Department seeking help with opiate detox. Pt states that his last opiate use was yesterday. He states that he takes Oxycodone, which was prescribed for him for his back pain. Pt states that he had an insurance lapse so he could not go back to the pain management clinic. He states that he has been getting the drugs off the street when he can get the. Pt is complaining of associated tremors, depression, myalgias, and diarrhea. He denies usage of marijuana or any other IV drug usage. Pt is a smoker. Denies SI or HI.  Past Medical History  Diagnosis Date  . Degenerative disc disease   . Chronic back pain   . Pneumothorax on left   . Emphysema/COPD    Past Surgical History  Procedure Laterality Date  . Lung surgery      Dr Arlyce Dice-- left lung  . Lung inflation      left lung  . Nose surgery      broken nose   Family History  Problem Relation Age of Onset  . Alpha-1 antitrypsin deficiency Mother   . Emphysema Mother   . Asthma Mother    History  Substance Use Topics  . Smoking status: Current Every Day Smoker -- 1.00 packs/day for 12 years    Types: Cigarettes  . Smokeless tobacco: Not on file  . Alcohol Use: No    Review of Systems  Constitutional: Negative for fever and chills.  Respiratory: Negative for cough.   Cardiovascular: Negative for chest pain.  Gastrointestinal: Positive for diarrhea. Negative for abdominal pain.  Musculoskeletal: Positive for myalgias.  Neurological:  Positive for tremors. Negative for dizziness and weakness.   Allergies  Effexor; Hydrocodone-acetaminophen; Ibuprofen; Lyrica; Neurontin; Nsaids; and Tramadol hcl  Home Medications   Prior to Admission medications   Medication Sig Start Date End Date Taking? Authorizing Provider  acetaminophen (TYLENOL) 500 MG tablet Take 1,000 mg by mouth every 6 (six) hours as needed. For pain.    Historical Provider, MD  albuterol (PROVENTIL HFA;VENTOLIN HFA) 108 (90 BASE) MCG/ACT inhaler Inhale 2 puffs into the lungs every 6 (six) hours as needed for wheezing.    Historical Provider, MD  albuterol-ipratropium (COMBIVENT) 18-103 MCG/ACT inhaler Inhale 2 puffs into the lungs every 4 (four) hours as needed for wheezing or shortness of breath. 08/04/13   April K Palumbo-Rasch, MD  clonazePAM (KLONOPIN) 2 MG tablet Take 2 mg by mouth 2 (two) times daily as needed. Anxiety    Historical Provider, MD  doxycycline (VIBRAMYCIN) 100 MG capsule Take 1 capsule (100 mg total) by mouth 2 (two) times daily. One po bid x 7 days 08/04/13   April K Palumbo-Rasch, MD  oxyCODONE (ROXICODONE) 15 MG immediate release tablet Take 15 mg by mouth every 8 (eight) hours as needed for pain.    Historical Provider, MD  predniSONE (DELTASONE) 20 MG tablet 3 tabs po day one, then 2 po daily x 4 days 08/04/13  April K Palumbo-Rasch, MD   BP 120/87  Pulse 117  Temp(Src) 98.4 F (36.9 C) (Oral)  Resp 20  Ht 6' 1.5" (1.867 m)  Wt 140 lb (63.504 kg)  BMI 18.22 kg/m2  SpO2 99%  Physical Exam  Nursing note and vitals reviewed. Constitutional: He is oriented to person, place, and time. He appears well-developed and well-nourished. No distress.  HENT:  Head: Normocephalic and atraumatic.  Eyes: Conjunctivae and EOM are normal.  Neck: Neck supple. No tracheal deviation present.  Cardiovascular: Normal rate.   Pulmonary/Chest: Effort normal. No respiratory distress.  Musculoskeletal: Normal range of motion.  Neurological: He is alert  and oriented to person, place, and time.  Skin: Skin is warm and dry.  Psychiatric: He has a normal mood and affect. His behavior is normal.    ED Course  Procedures   DIAGNOSTIC STUDIES: Oxygen Saturation is 99% on RA, normal by my interpretation.    COORDINATION OF CARE: 11:12 PM- Pt advised of plan for treatment and pt agrees.  Patient well appearing. Laboratory testing unremarkable. He is medically cleared for further evaluation of drug addiction.  Psychiatric holding orders in place. TTS consult placed.    Results for orders placed during the hospital encounter of 08/22/13  ACETAMINOPHEN LEVEL      Result Value Ref Range   Acetaminophen (Tylenol), Serum <15.0  10 - 30 ug/mL  CBC      Result Value Ref Range   WBC 11.1 (*) 4.0 - 10.5 K/uL   RBC 5.11  4.22 - 5.81 MIL/uL   Hemoglobin 16.8  13.0 - 17.0 g/dL   HCT 47.1  39.0 - 52.0 %   MCV 92.2  78.0 - 100.0 fL   MCH 32.9  26.0 - 34.0 pg   MCHC 35.7  30.0 - 36.0 g/dL   RDW 12.7  11.5 - 15.5 %   Platelets 398  150 - 400 K/uL  COMPREHENSIVE METABOLIC PANEL      Result Value Ref Range   Sodium 143  137 - 147 mEq/L   Potassium 5.2  3.7 - 5.3 mEq/L   Chloride 102  96 - 112 mEq/L   CO2 27  19 - 32 mEq/L   Glucose, Bld 132 (*) 70 - 99 mg/dL   BUN 10  6 - 23 mg/dL   Creatinine, Ser 1.02  0.50 - 1.35 mg/dL   Calcium 10.2  8.4 - 10.5 mg/dL   Total Protein 8.1  6.0 - 8.3 g/dL   Albumin 4.9  3.5 - 5.2 g/dL   AST 15  0 - 37 U/L   ALT 12  0 - 53 U/L   Alkaline Phosphatase 109  39 - 117 U/L   Total Bilirubin 0.6  0.3 - 1.2 mg/dL   GFR calc non Af Amer >90  >90 mL/min   GFR calc Af Amer >90  >90 mL/min  ETHANOL      Result Value Ref Range   Alcohol, Ethyl (B) <11  0 - 11 mg/dL  SALICYLATE LEVEL      Result Value Ref Range   Salicylate Lvl <5.7 (*) 2.8 - 20.0 mg/dL  URINE RAPID DRUG SCREEN (HOSP PERFORMED)      Result Value Ref Range   Opiates POSITIVE (*) NONE DETECTED   Cocaine NONE DETECTED  NONE DETECTED    Benzodiazepines NONE DETECTED  NONE DETECTED   Amphetamines NONE DETECTED  NONE DETECTED   Tetrahydrocannabinol NONE DETECTED  NONE DETECTED   Barbiturates NONE DETECTED  NONE DETECTED    MDM   Final diagnoses:  Opioid dependence with withdrawal      I personally performed the services described in this documentation, which was scribed in my presence. The recorded information has been reviewed and is accurate.      Martie Lee, PA-C 08/23/13 [phone]

## 2013-08-22 NOTE — ED Notes (Signed)
Patient states he is seeking help with opiate detox. Last use, yesterday. Patient takes Oxycodone, which he states are prescribed for him for back pain. Patient states he is taking 15mg  TID, prescribed by pain management MD. Patient states he has had an insurance lapse and is currently unable to return to his pain mgmt MD.

## 2013-08-23 MED ORDER — ACETAMINOPHEN 325 MG PO TABS
650.0000 mg | ORAL_TABLET | Freq: Four times a day (QID) | ORAL | Status: DC | PRN
  Administered 2013-08-23: 650 mg via ORAL
  Filled 2013-08-23: qty 2

## 2013-08-23 NOTE — ED Notes (Signed)
Pt belongings including a black hat, tan pants, brown belt, brown shoes, and gray shirt placed in TCU locker # 28.

## 2013-08-23 NOTE — ED Notes (Signed)
Bedside report received from previous RN, Abbie.

## 2013-08-23 NOTE — Progress Notes (Signed)
[facility] provided pt with a list of primary care resources, highlighting [facility]. Patient stated that insurance relapsed but will be reinstated in two weeks.

## 2013-08-23 NOTE — ED Notes (Signed)
Psych PA at bedside.

## 2013-08-23 NOTE — ED Provider Notes (Addendum)
Patient accepted at [facility] for detox. Slightly tachy on arrival likely from mild withdrawal. Not tachy on d/c. Referral made to [facility] but declined. Will give outpatient resources.   Wandra Arthurs, MD 08/23/13 [place], MD 08/23/13 [phone]

## 2013-08-23 NOTE — ED Notes (Signed)
Pt walked the unit multiple times, has eaten his breakfast, rechecked BP now 123/87. He is asking to speak with a DR because he not happy that he is not going to be placed into an inpt facility. Will continue to monitor.

## 2013-08-23 NOTE — ED Notes (Signed)
Resources faxed over by TTS MD given to pt.

## 2013-08-23 NOTE — ED Notes (Signed)
TTS in progress 

## 2013-08-23 NOTE — ED Provider Notes (Signed)
Medical screening examination/treatment/procedure(s) were performed by non-physician practitioner and as supervising physician I was immediately available for consultation/collaboration.   Neta Ehlers, MD 08/23/13 [phone]

## 2013-08-23 NOTE — Discharge Instructions (Signed)
Go to detox center now.   Stop using drugs and/or alcohol.   Follow up with your doctor.   Return to ER if you have withdrawal, seizures, thoughts of harming yourself or others.

## 2013-08-23 NOTE — BH Assessment (Signed)
Tele Assessment Note   Brett Beck is an 38 y.o. male.  -Clinician talked to Hazel Sams, PA about patient needs.  Patient did have a prescription for oxycodone from doctor up until the end of March.  At that time his insurance lapsed at work and he was unable to obtain it.  He then started getting it on the street.  He is wanting to detox but he also is interested in getting on suboxone.  Patient does admit to some depression related to his chronic pain and his having to care for his aged mother who is in poor health.  Patient says that he uses oxycodone 15 and it is prescribed for 3x/D.  Since he started getting it on the street he says he takes a little more but it averages out to be the same as the prescription.  Patient says his last use was two doses on 06/28.  Patient says he also takes clonopin 2mg  prn and ran out of that on 06/28.    Patient wants to detox from opiates and is also interested in suboxone if it is appropriate for him.  He says that his insurance is supposed to be re-instated in a few weeks.  He denies SI, HI or A/V hallucinations.  He used to see counselor Milagros Loll but that was 5 years ago.  No previous inpatient experience.  -Patient care was discussed with Patriciaann Clan, PA who recommended referral to [facility] or [facility].  Baker Janus at [facility] said that they do use subutex for detox.  Patient information was sent to [facility] for them to review.  Pt was also provided with information on [facility] suboxone tx and other pain management referrals.  Information was sent to nurse Anderson Malta so that patient would have that resource.  Axis I: 304 Opioid use d/o severe Axis II: Deferred Axis III:  Past Medical History  Diagnosis Date  . Degenerative disc disease   . Chronic back pain   . Pneumothorax on left   . Emphysema/COPD    Axis IV: economic problems, other psychosocial or environmental problems and problems with access to health care services Axis V: 31-40 impairment in reality  testing  Past Medical History:  Past Medical History  Diagnosis Date  . Degenerative disc disease   . Chronic back pain   . Pneumothorax on left   . Emphysema/COPD     Past Surgical History  Procedure Laterality Date  . Lung surgery      Dr Arlyce Dice-- left lung  . Lung inflation      left lung  . Nose surgery      broken nose    Family History:  Family History  Problem Relation Age of Onset  . Alpha-1 antitrypsin deficiency Mother   . Emphysema Mother   . Asthma Mother     Social History:  reports that he has been smoking Cigarettes.  He has a 12 pack-year smoking history. He does not have any smokeless tobacco history on file. He reports that he does not drink alcohol or use illicit drugs.  Additional Social History:  Alcohol / Drug Use Pain Medications: Pt states he has been using oxycodone 15 mg 3x/D Prescriptions: Clonopin 2mg  PRN, last dose was 06/28.  Ran out of prescription for oxycodone in March when insurance lapsed. Over the Counter: See PTA medication list History of alcohol / drug use?: Yes Withdrawal Symptoms: Cramps;Nausea / Vomiting;Fever / Chills;Sweats;Diarrhea;Weakness;Patient aware of relationship between substance abuse and physical/medical complications Substance #1 Name  of Substance 1: Oxycodone 1 - Age of First Use: 38 years of age 56 - Amount (size/oz): 15mg  per day on average 1 - Frequency: Daily taking 3 times per day 1 - Duration: Been getting it off the street since end of March. 1 - Last Use / Amount: 06/28  used two.  CIWA: CIWA-Ar BP: 95/65 mmHg Pulse Rate: 78 COWS:    Allergies:  Allergies  Allergen Reactions  . Effexor [Venlafaxine] Diarrhea  . Hydrocodone-Acetaminophen     REACTION: severe headaches  . Ibuprofen Other (See Comments)    Severe stomach issues  . Lyrica [Pregabalin] Diarrhea    Swelling in the feet  . Neurontin [Gabapentin] Other (See Comments)    Severe stomach issues  . Nsaids Other (See Comments)    Severe  stomach issues  . Tramadol Hcl     REACTION: diarrhea, abdominal pain    Home Medications:  (Not in a hospital admission)  OB/GYN Status:  No LMP for male patient.  General Assessment Data Location of Assessment: [facility] ED Is this a Tele or Face-to-Face Assessment?: Tele Assessment Is this an Initial Assessment or a Re-assessment for this encounter?: Initial Assessment Living Arrangements: Alone Can pt return to current living arrangement?: Yes Admission Status: Voluntary Is patient capable of signing voluntary admission?: Yes Transfer from: [facility] Referral Source: Self/Family/Friend     [place] Living Arrangements: Alone Name of Psychiatrist: None Name of Therapist: N/A     Risk to self Suicidal Ideation: No Suicidal Intent: No Is patient at risk for suicide?: No Suicidal Plan?: No Access to Means: No What has been your use of drugs/alcohol within the last 12 months?: Use of oxycodone off the street Previous Attempts/Gestures: No How many times?: 0 Other Self Harm Risks: N/A Triggers for Past Attempts: None known Intentional Self Injurious Behavior: None Family Suicide History: Yes (Paternal uncle committed suicide when pt was 42 years old) Recent stressful life event(s): Financial Problems;Other (Comment) (Loss of insurance.  Caring for aged mother.) Persecutory voices/beliefs?: No Depression: Yes Depression Symptoms: Despondent;Insomnia;Tearfulness;Isolating;Loss of interest in usual pleasures;Feeling worthless/self pity Substance abuse history and/or treatment for substance abuse?: Yes Suicide prevention information given to non-admitted patients: Not applicable  Risk to Others Homicidal Ideation: No Thoughts of Harm to Others: No Current Homicidal Intent: No Current Homicidal Plan: No Access to Homicidal Means: No Identified Victim: No one History of harm to others?: No Assessment of Violence: None Noted Violent Behavior Description:  None noted Does patient have access to weapons?: No Criminal Charges Pending?: No Does patient have a court date: No  Psychosis Hallucinations: None noted Delusions: None noted  Mental Status Report Appear/Hygiene: Disheveled;In scrubs Eye Contact: Good Motor Activity: Freedom of movement;Unremarkable Speech: Logical/coherent Level of Consciousness: Alert Mood: Depressed;Sad Affect: Apprehensive;Depressed Anxiety Level: Moderate Thought Processes: Coherent;Relevant Judgement: Unimpaired Orientation: Person;Place;Time;Situation Obsessive Compulsive Thoughts/Behaviors: None  Cognitive Functioning Concentration: Normal Memory: Recent Intact;Remote Intact IQ: Average Insight: Good Impulse Control: Poor Appetite: Fair Weight Loss: 0 Weight Gain: 0 Sleep: No Change Total Hours of Sleep:  (Varies between 2-6) Vegetative Symptoms: Staying in bed  ADLScreening [facility] Assessment Services) Patient's cognitive ability adequate to safely complete daily activities?: Yes Patient able to express need for assistance with ADLs?: Yes Independently performs ADLs?: Yes (appropriate for developmental age)  Prior Inpatient Therapy Prior Inpatient Therapy: No Prior Therapy Dates: N/A Prior Therapy Facilty/Provider(s): N/A Reason for Treatment: N/A  Prior Outpatient Therapy Prior Outpatient Therapy: Yes Prior Therapy Dates: 5 years ago Prior  Therapy Facilty/Provider(s): Milagros Loll Reason for Treatment: depression  ADL Screening (condition at time of admission) Patient's cognitive ability adequate to safely complete daily activities?: Yes Is the patient deaf or have difficulty hearing?: No Does the patient have difficulty seeing, even when wearing glasses/contacts?: No Does the patient have difficulty concentrating, remembering, or making decisions?: No Patient able to express need for assistance with ADLs?: Yes Does the patient have difficulty dressing or bathing?: No Independently  performs ADLs?: Yes (appropriate for developmental age) Does the patient have difficulty walking or climbing stairs?: No Weakness of Legs: None Weakness of Arms/Hands: None       Abuse/Neglect Assessment (Assessment to be complete while patient is alone) Physical Abuse: Denies Verbal Abuse: Denies Sexual Abuse: Denies Exploitation of patient/patient's resources: Denies Self-Neglect: Denies Values / Beliefs Cultural Requests During Hospitalization: None Spiritual Requests During Hospitalization: None   Advance Directives (For Healthcare) Advance Directive: Patient does not have advance directive;Patient would not like information    Additional Information 1:1 In Past 12 Months?: No CIRT Risk: No Elopement Risk: No Does patient have medical clearance?: Yes     Disposition:  Disposition Initial Assessment Completed for this Encounter: Yes Disposition of Patient: Inpatient treatment program;Referred to Type of inpatient treatment program: Adult Patient referred to: Cleotis Nipper Ray 08/23/2013 7:22 AM

## 2014-04-02 ENCOUNTER — Emergency Department (HOSPITAL_BASED_OUTPATIENT_CLINIC_OR_DEPARTMENT_OTHER): Payer: 59

## 2014-04-02 ENCOUNTER — Emergency Department (HOSPITAL_BASED_OUTPATIENT_CLINIC_OR_DEPARTMENT_OTHER)
Admission: EM | Admit: 2014-04-02 | Discharge: 2014-04-02 | Disposition: A | Discharge status: Home / Self Care | Payer: 59 | Attending: Emergency Medicine | Admitting: Emergency Medicine

## 2014-04-02 ENCOUNTER — Encounter (HOSPITAL_BASED_OUTPATIENT_CLINIC_OR_DEPARTMENT_OTHER): Payer: Self-pay | Admitting: *Deleted

## 2014-04-02 DIAGNOSIS — F1123 Opioid dependence with withdrawal: Secondary | ICD-10-CM | POA: Insufficient documentation

## 2014-04-02 DIAGNOSIS — Z79899 Other long term (current) drug therapy: Secondary | ICD-10-CM | POA: Insufficient documentation

## 2014-04-02 DIAGNOSIS — Z8739 Personal history of other diseases of the musculoskeletal system and connective tissue: Secondary | ICD-10-CM | POA: Diagnosis not present

## 2014-04-02 DIAGNOSIS — Z720 Tobacco use: Secondary | ICD-10-CM | POA: Insufficient documentation

## 2014-04-02 DIAGNOSIS — J449 Chronic obstructive pulmonary disease, unspecified: Secondary | ICD-10-CM | POA: Diagnosis not present

## 2014-04-02 DIAGNOSIS — F1310 Sedative, hypnotic or anxiolytic abuse, uncomplicated: Secondary | ICD-10-CM | POA: Diagnosis not present

## 2014-04-02 DIAGNOSIS — F1320 Sedative, hypnotic or anxiolytic dependence, uncomplicated: Secondary | ICD-10-CM

## 2014-04-02 DIAGNOSIS — G8929 Other chronic pain: Secondary | ICD-10-CM | POA: Diagnosis not present

## 2014-04-02 DIAGNOSIS — Z008 Encounter for other general examination: Secondary | ICD-10-CM | POA: Diagnosis present

## 2014-04-02 LAB — CBC WITH DIFFERENTIAL/PLATELET
Basophils Absolute: 0 10*3/uL (ref 0.0–0.1)
Basophils Relative: 0 % (ref 0–1)
EOS ABS: 0 10*3/uL (ref 0.0–0.7)
Eosinophils Relative: 0 % (ref 0–5)
HCT: 43.5 % (ref 39.0–52.0)
Hemoglobin: 15.2 g/dL (ref 13.0–17.0)
Lymphocytes Relative: 13 % (ref 12–46)
Lymphs Abs: 1.3 10*3/uL (ref 0.7–4.0)
MCH: 31.9 pg (ref 26.0–34.0)
MCHC: 34.9 g/dL (ref 30.0–36.0)
MCV: 91.2 fL (ref 78.0–100.0)
Monocytes Absolute: 0.6 10*3/uL (ref 0.1–1.0)
Monocytes Relative: 6 % (ref 3–12)
NEUTROS PCT: 81 % — AB (ref 43–77)
Neutro Abs: 8 10*3/uL — ABNORMAL HIGH (ref 1.7–7.7)
Platelets: 377 10*3/uL (ref 150–400)
RBC: 4.77 MIL/uL (ref 4.22–5.81)
RDW: 12.7 % (ref 11.5–15.5)
WBC: 10 10*3/uL (ref 4.0–10.5)

## 2014-04-02 LAB — RAPID URINE DRUG SCREEN, HOSP PERFORMED
Amphetamines: NOT DETECTED
BENZODIAZEPINES: NOT DETECTED
Barbiturates: NOT DETECTED
Cocaine: NOT DETECTED
Opiates: NOT DETECTED
Tetrahydrocannabinol: NOT DETECTED

## 2014-04-02 LAB — URINALYSIS, ROUTINE W REFLEX MICROSCOPIC
BILIRUBIN URINE: NEGATIVE
Glucose, UA: NEGATIVE mg/dL
Hgb urine dipstick: NEGATIVE
Ketones, ur: NEGATIVE mg/dL
LEUKOCYTES UA: NEGATIVE
Nitrite: NEGATIVE
Protein, ur: NEGATIVE mg/dL
Specific Gravity, Urine: 1.002 — ABNORMAL LOW (ref 1.005–1.030)
Urobilinogen, UA: 0.2 mg/dL (ref 0.0–1.0)
pH: 6.5 (ref 5.0–8.0)

## 2014-04-02 LAB — BASIC METABOLIC PANEL
ANION GAP: 6 (ref 5–15)
BUN: 11 mg/dL (ref 6–23)
CALCIUM: 8.8 mg/dL (ref 8.4–10.5)
CO2: 24 mmol/L (ref 19–32)
Chloride: 106 mmol/L (ref 96–112)
Creatinine, Ser: 1.02 mg/dL (ref 0.50–1.35)
GFR calc Af Amer: >90 mL/min (ref >90)
Glucose, Bld: 96 mg/dL (ref 70–99)
POTASSIUM: 3.1 mmol/L — AB (ref 3.5–5.1)
Sodium: 136 mmol/L (ref 135–145)

## 2014-04-02 LAB — TROPONIN I

## 2014-04-02 LAB — ETHANOL

## 2014-04-02 MED ORDER — LORAZEPAM 2 MG/ML IJ SOLN: 0.0000 mg | Freq: Four times a day (QID) | INTRAMUSCULAR | Status: DC

## 2014-04-02 MED ORDER — ONDANSETRON 4 MG PO TBDP: ORAL_TABLET | ORAL | Status: DC

## 2014-04-02 MED ORDER — LORAZEPAM 2 MG/ML IJ SOLN: 0.0000 mg | Freq: Two times a day (BID) | INTRAMUSCULAR | Status: DC

## 2014-04-02 MED ORDER — LORAZEPAM 1 MG PO TABS: 0.0000 mg | ORAL_TABLET | Freq: Four times a day (QID) | ORAL | Status: DC

## 2014-04-02 MED ORDER — CLONAZEPAM 1 MG PO TABS: 1.0000 mg | ORAL_TABLET | Freq: Two times a day (BID) | ORAL | Status: DC | PRN

## 2014-04-02 MED ORDER — CLONIDINE HCL 0.1 MG PO TABS: 0.1000 mg | ORAL_TABLET | Freq: Three times a day (TID) | ORAL | Status: DC | PRN

## 2014-04-02 MED ORDER — LORAZEPAM 2 MG/ML IJ SOLN
2.0000 mg | Freq: Once | INTRAMUSCULAR | Status: AC
  Administered 2014-04-02: 2 mg via INTRAMUSCULAR
  Filled 2014-04-02: qty 1

## 2014-04-02 MED ORDER — LORAZEPAM 1 MG PO TABS: 0.0000 mg | ORAL_TABLET | Freq: Two times a day (BID) | ORAL | Status: DC

## 2014-04-02 NOTE — ED Notes (Signed)
TTS in process 

## 2014-04-02 NOTE — ED Provider Notes (Signed)
  Physical Exam  BP 119/74 mmHg  Pulse 80  Temp(Src) 98 F (36.7 C) (Oral)  Resp 18  Ht 6\' 1"  (1.854 m)  Wt 145 lb (65.772 kg)  BMI 19.13 kg/m2  SpO2 99%  Physical Exam  ED Course  Procedures  MDM Care assumed at sign out. Patient came here wanting opioid and benzo detox. UDS neg for benzos. TTS said that patient not qualify for inpatient detox. Vitals stable. CXR showed COPD but not hypoxic or wheezing. I reassured him and sister. Will dc with klonopin taper, clonidine, zofran. Will give outpatient resources.   Wandra Arthurs, MD 04/02/14 [phone]

## 2014-04-02 NOTE — [facility] Counselor (Signed)
Received 2 [facility] consults at the same time. Current tele-assess machine in use. Will assess when tele-assess machine is available.   Lorenza Cambridge, [facility] Triage Specialist

## 2014-04-02 NOTE — ED Notes (Signed)
Pt states SOB since last night.  Stopped cymbicort inhalor on Monday, no increased SOB throughout the week.  Stopped oxycodone use on Friday and last night SOB began.  Pt also states chest pain, back pain and abdominal pain. Pt states has not eaten in 2 days, loss of appetite, but has drank minimal amounts of soda.

## 2014-04-02 NOTE — ED Notes (Signed)
C/o sob since last pm has hx of COPD. All over body aches and chills. Ran out of meds on Friday. States has been on oxycodone x 6 years. Lungs clear x 2. No resp distress noted.

## 2014-04-02 NOTE — Discharge Instructions (Signed)
Take zofran for nausea.   Take clonidine for palpitations and withdrawal symptoms.   You need to slowly take less klonopin. Take two pills daily for 1 day then 1 pill daily for 2 days then try to stop.   Call detox centers.   Follow up with your doctor.   Return to ER if you have worse palpitations, withdrawal symptoms, hallucinations, thoughts of harming yourself or others.

## 2014-04-02 NOTE — ED Provider Notes (Signed)
CSN: [id number]     Arrival date & time 04/02/14  1300 History   First MD Initiated Contact with Patient 04/02/14 1321     Chief Complaint  Patient presents with  . Withdrawal     (Consider location/radiation/quality/duration/timing/severity/associated sxs/prior Treatment) Patient is a 39 y.o. male presenting with shortness of breath.  Shortness of Breath Severity:  Moderate Onset quality:  Gradual Duration:  2 days Timing:  Constant Progression:  Unchanged Chronicity:  Recurrent Context comment:  Ran out of home COPD medications, as well as narcotics and klonopin Relieved by:  Nothing Worsened by:  Nothing tried Ineffective treatments:  None tried Associated symptoms: no chest pain, no cough, no fever, no syncope and no vomiting   Associated symptoms comment:  Diarrhea   Past Medical History  Diagnosis Date  . Degenerative disc disease   . Chronic back pain   . Pneumothorax on left   . Emphysema/COPD    Past Surgical History  Procedure Laterality Date  . Lung surgery      Dr Arlyce Dice-- left lung  . Lung inflation      left lung  . Nose surgery      broken nose   Family History  Problem Relation Age of Onset  . Alpha-1 antitrypsin deficiency Mother   . Emphysema Mother   . Asthma Mother    History  Substance Use Topics  . Smoking status: Current Every Day Smoker -- 0.50 packs/day for 12 years    Types: Cigarettes  . Smokeless tobacco: Not on file  . Alcohol Use: No    Review of Systems  Constitutional: Negative for fever.  Respiratory: Positive for shortness of breath. Negative for cough.   Cardiovascular: Negative for chest pain and syncope.  Gastrointestinal: Negative for vomiting.  All other systems reviewed and are negative.     Allergies  Effexor; Hydrocodone-acetaminophen; Ibuprofen; Lyrica; Neurontin; Nsaids; and Tramadol hcl  Home Medications   Prior to Admission medications   Medication Sig Start Date End Date Taking? Authorizing Provider   acetaminophen (TYLENOL) 500 MG tablet Take 1,000 mg by mouth every 6 (six) hours as needed. For pain.   Yes Historical Provider, MD  budesonide-formoterol (SYMBICORT) 160-4.5 MCG/ACT inhaler Inhale 2 puffs into the lungs 2 (two) times daily.   Yes Historical Provider, MD  oxyCODONE (ROXICODONE) 15 MG immediate release tablet Take 15 mg by mouth every 8 (eight) hours as needed for pain.   Yes Historical Provider, MD  albuterol (PROVENTIL HFA;VENTOLIN HFA) 108 (90 BASE) MCG/ACT inhaler Inhale 2 puffs into the lungs every 6 (six) hours as needed for wheezing.    Historical Provider, MD  albuterol-ipratropium (COMBIVENT) 18-103 MCG/ACT inhaler Inhale 2 puffs into the lungs every 4 (four) hours as needed for wheezing or shortness of breath. 08/04/13   April K Palumbo-Rasch, MD  clonazePAM (KLONOPIN) 1 MG tablet Take 1 tablet (1 mg total) by mouth 2 (two) times daily as needed for anxiety. 04/02/14   Wandra Arthurs, MD  cloNIDine (CATAPRES) 0.1 MG tablet Take 1 tablet (0.1 mg total) by mouth 3 (three) times daily as needed. 04/02/14   Wandra Arthurs, MD  doxycycline (VIBRAMYCIN) 100 MG capsule Take 1 capsule (100 mg total) by mouth 2 (two) times daily. One po bid x 7 days 08/04/13   April K Palumbo-Rasch, MD  ondansetron [facility] ODT) 4 MG disintegrating tablet 4mg  ODT q4 hours prn nausea/vomit 04/02/14   Wandra Arthurs, MD   BP 128/89 mmHg  Pulse 98  Temp(Src) 98 F (36.7 C) (Oral)  Resp 18  Ht 6\' 1"  (1.854 m)  Wt 145 lb (65.772 kg)  BMI 19.13 kg/m2  SpO2 99% Physical Exam  Constitutional: He is oriented to person, place, and time. He appears well-developed and well-nourished.  HENT:  Head: Normocephalic and atraumatic.  Eyes: Conjunctivae and EOM are normal.  Neck: Normal range of motion. Neck supple.  Cardiovascular: Normal rate, regular rhythm and normal heart sounds.   Pulmonary/Chest: Effort normal and breath sounds normal. No respiratory distress.  Abdominal: He exhibits no distension. There is no  tenderness. There is no rebound and no guarding.  Musculoskeletal: Normal range of motion.  Neurological: He is alert and oriented to person, place, and time.  Skin: Skin is warm and dry.  No piloerection or tremors  Vitals reviewed.   ED Course  Procedures (including critical care time) Labs Review Labs Reviewed  CBC WITH DIFFERENTIAL/PLATELET - Abnormal; Notable for the following:    Neutrophils Relative % 81 (*)    Neutro Abs 8.0 (*)    All other components within normal limits  BASIC METABOLIC PANEL - Abnormal; Notable for the following:    Potassium 3.1 (*)    All other components within normal limits  URINALYSIS, ROUTINE W REFLEX MICROSCOPIC - Abnormal; Notable for the following:    Specific Gravity, Urine 1.002 (*)    All other components within normal limits  URINE RAPID DRUG SCREEN (HOSP PERFORMED)  ETHANOL  TROPONIN I    Imaging Review Dg Chest 2 View  04/02/2014   CLINICAL DATA:  COPD exacerbation with 1 day history of shortness of breath and chest pain. Anorexia.  EXAM: CHEST  2 VIEW  COMPARISON:  08/03/2013 dating back to 03/02/2008.  FINDINGS: Cardiomediastinal silhouette unremarkable, unchanged. Severe bullous emphysema, progressive since 2010, unchanged since June, 2015. No focal airspace consolidation. No pneumothorax. Pleural scarring at the bases, unchanged. No pleural effusions. No pneumothorax. Visualized bony thorax intact.  IMPRESSION: Severe COPD/emphysema.  No acute cardiopulmonary disease.   Electronically Signed   By: Evangeline Dakin M.D.   On: 04/02/2014 14:58     EKG Interpretation None      MDM   Final diagnoses:  Opioid dependence with withdrawal  Benzodiazepine dependence    39 y.o. male with pertinent PMH of COPD presents with initial chief complaint of dyspnea.  He states he has been out of his medications for a while, was reluctant to disclose that he, per his report, threw out his oxycodone (#90) that he had filled 1 week ago.  Physical  exam on arrival as above, completely benign.  After discussing with the pt that I would not prescribe controlled substances, he stated he was suicidal.  I discussed that this course of events was suspicious for malingering, however he continued to state that without his medicines he thought he might hurt himself.  TTS consulted.    I have reviewed all laboratory and imaging studies if ordered as above  1. Opioid dependence with withdrawal   2. Benzodiazepine dependence         Debby Freiberg, MD 04/03/14 [phone]

## 2014-04-02 NOTE — BH Assessment (Addendum)
Tele Assessment Note   Brett Beck is an 39 y.o. male. Pt arrived to [facility] voluntarily. Pt reports SI if released with no medication. Pt denies HI. Pt denies hallucinations and delusions. According to the Pt, he needs his medications. Pt states that he needs a re-fill of Klonopin and Oxycodone or detox. Pt admits to taking more than prescribed. Pt reports that he has been using Oxycodone for 10 years and Klonopin for 16 years. Pt states that he was prescribed Oxycodone for his back pain and Klonopin for anxiety. Pt reports withdrawal symptoms from Oxycodone and Klonopin. According to the Pt, he sweats, shakes, has dirreahea, loses his appetite, and has insominia when he does not have his medication. Pt reports having COPD, heart problems, and chronic back pain. Pt denies any outpatient or inpatient treatment. Pt reports depressive symptoms since running out of his medication. According to the Pt, he isolates himself, cries daily, and has loss interest in activities.   Writer consulted with NP Manus Gunning. Per Manus Gunning Pt does not meet inpatient criteria. Writer faxed SA information to the Pt.  Writer informed RN and EDP.  Axis I: Anxiety Disorder NOS and Depressive Disorder NOS Axis II: Deferred Axis III:  Past Medical History  Diagnosis Date  . Degenerative disc disease   . Chronic back pain   . Pneumothorax on left   . Emphysema/COPD    Axis IV: other psychosocial or environmental problems and problems with access to health care services Axis V: 51-60 moderate symptoms  Past Medical History:  Past Medical History  Diagnosis Date  . Degenerative disc disease   . Chronic back pain   . Pneumothorax on left   . Emphysema/COPD     Past Surgical History  Procedure Laterality Date  . Lung surgery      Dr Arlyce Dice-- left lung  . Lung inflation      left lung  . Nose surgery      broken nose    Family History:  Family History  Problem Relation Age of Onset  . Alpha-1  antitrypsin deficiency Mother   . Emphysema Mother   . Asthma Mother     Social History:  reports that he has been smoking Cigarettes.  He has a 6 pack-year smoking history. He does not have any smokeless tobacco history on file. He reports that he does not drink alcohol or use illicit drugs.  Additional Social History:  Alcohol / Drug Use Pain Medications: Oxycodone Prescriptions: Klonopin History of alcohol / drug use?: Yes Longest period of sobriety (when/how long): None  CIWA: CIWA-Ar BP: 115/68 mmHg Pulse Rate: 78 COWS:    PATIENT STRENGTHS: (choose at least two) Communication skills Supportive family/friends  Allergies:  Allergies  Allergen Reactions  . Effexor [Venlafaxine] Diarrhea  . Hydrocodone-Acetaminophen     REACTION: severe headaches  . Ibuprofen Other (See Comments)    Severe stomach issues  . Lyrica [Pregabalin] Diarrhea    Swelling in the feet  . Neurontin [Gabapentin] Other (See Comments)    Severe stomach issues  . Nsaids Other (See Comments)    Severe stomach issues  . Tramadol Hcl     REACTION: diarrhea, abdominal pain    Home Medications:  (Not in a hospital admission)  OB/GYN Status:  No LMP for male patient.  General Assessment Data [facility] of Assessment[facility] ([facility] ACT Assessment: Yes Is this a Tele or Face-to-Face Assessment?: Tele Assessment Is this an Initial Assessment or a Re-assessment  for this encounter?: Initial Assessment Living Arrangements: Alone Can pt return to current living arrangement?: Yes Admission Status: Voluntary Is patient capable of signing voluntary admission?: Yes Transfer from: Home Referral Source: Self/Family/Friend     [place] Living Arrangements: Alone Name of Psychiatrist: None Name of Therapist: None  Education Status Is patient currently in school?: No Current Grade: NA Highest grade of school patient has completed: Some college Name of school:  NA Contact person: NA  Risk to self with the past 6 months Suicidal Ideation: Yes-Currently Present Suicidal Intent: Yes-Currently Present (Pt states if he does not get Inpt then he will harm self) Is patient at risk for suicide?: Yes Suicidal Plan?: No Access to Means: No What has been your use of drugs/alcohol within the last 12 months?: Oxycodone for 10 yrs, Klonopin for 16 years Previous Attempts/Gestures: No How many times?: 0 Other Self Harm Risks: None Triggers for Past Attempts: Other (Comment) (Reports pain) Intentional Self Injurious Behavior: None Family Suicide History: No Recent stressful life event(s): Other (Comment) (Pain and physical health) Persecutory voices/beliefs?: No Depression: Yes Depression Symptoms: Tearfulness, Loss of interest in usual pleasures, Feeling worthless/self pity, Feeling angry/irritable Substance abuse history and/or treatment for substance abuse?: No Suicide prevention information given to non-admitted patients: Not applicable  Risk to Others within the past 6 months Homicidal Ideation: No Thoughts of Harm to Others: No Current Homicidal Intent: No Current Homicidal Plan: No Access to Homicidal Means: No Identified Victim: NA History of harm to others?: No Assessment of Violence: None Noted Violent Behavior Description: None Does patient have access to weapons?: No Criminal Charges Pending?: No Does patient have a court date: No  Psychosis Hallucinations: None noted Delusions: None noted  Mental Status Report Appear/Hygiene: Unremarkable, In hospital gown Eye Contact: Good Motor Activity: Freedom of movement Speech: Logical/coherent Level of Consciousness: Alert Mood: Depressed, Sad Affect: Appropriate to circumstance Anxiety Level: Minimal Thought Processes: Coherent, Relevant Judgement: Unimpaired Orientation: Person, Place, Time, Situation, Appropriate for developmental age Obsessive Compulsive Thoughts/Behaviors:  Minimal  Cognitive Functioning Concentration: Normal Memory: Recent Intact, Remote Intact IQ: Average Insight: Good Impulse Control: Good Appetite: Poor Weight Loss: 0 Weight Gain: 0 Sleep: Decreased Total Hours of Sleep: 0 Vegetative Symptoms: None  ADLScreening [facility] Assessment Services) Patient's cognitive ability adequate to safely complete daily activities?: Yes Patient able to express need for assistance with ADLs?: Yes Independently performs ADLs?: Yes (appropriate for developmental age)  Prior Inpatient Therapy Prior Inpatient Therapy: No Prior Therapy Dates: NA Prior Therapy Facilty/Provider(s): NA Reason for Treatment: NA  Prior Outpatient Therapy Prior Outpatient Therapy: No Prior Therapy Dates: NA Prior Therapy Facilty/Provider(s): NA Reason for Treatment: NA  ADL Screening (condition at time of admission) Patient's cognitive ability adequate to safely complete daily activities?: Yes Is the patient deaf or have difficulty hearing?: No Does the patient have difficulty seeing, even when wearing glasses/contacts?: No Does the patient have difficulty concentrating, remembering, or making decisions?: No Patient able to express need for assistance with ADLs?: Yes Does the patient have difficulty dressing or bathing?: No Independently performs ADLs?: Yes (appropriate for developmental age) Does the patient have difficulty walking or climbing stairs?: No Weakness of Legs: None Weakness of Arms/Hands: None       Abuse/Neglect Assessment (Assessment to be complete while patient is alone) Physical Abuse: Denies Verbal Abuse: Denies Sexual Abuse: Denies Exploitation of patient/patient's resources: Denies Self-Neglect: Denies Values / Beliefs Cultural Requests During Hospitalization: None Spiritual Requests During Hospitalization: None Consults Spiritual Care Consult  Needed: No Social Work Consult Needed: No Regulatory affairs officer (For Healthcare) Does patient  have an advance directive?: No Would patient like information on creating an advanced directive?: No - patient declined information    Additional Information 1:1 In Past 12 Months?: No CIRT Risk: No Elopement Risk: No Does patient have medical clearance?: Yes     Disposition:  Disposition Initial Assessment Completed for this Encounter: Yes  Taylon Coole D 04/02/2014 4:25 PM

## 2014-04-03 ENCOUNTER — Emergency Department: Payer: Self-pay | Admitting: Emergency Medicine

## 2014-04-03 LAB — DRUG SCREEN, URINE
Amphetamines, Ur Screen: NEGATIVE (ref ?–1000)
Barbiturates, Ur Screen: NEGATIVE (ref ?–200)
Benzodiazepine, Ur Scrn: POSITIVE (ref ?–200)
COCAINE METABOLITE, UR ~~LOC~~: NEGATIVE (ref ?–300)
Cannabinoid 50 Ng, Ur ~~LOC~~: NEGATIVE (ref ?–50)
MDMA (ECSTASY) UR SCREEN: NEGATIVE (ref ?–500)
Methadone, Ur Screen: NEGATIVE (ref ?–300)
OPIATE, UR SCREEN: POSITIVE (ref ?–300)
PHENCYCLIDINE (PCP) UR S: NEGATIVE (ref ?–25)
Tricyclic, Ur Screen: NEGATIVE (ref ?–1000)

## 2014-04-03 LAB — CBC WITH DIFFERENTIAL/PLATELET
BASOS PCT: 0.5 %
Basophil #: 0.1 10*3/uL (ref 0.0–0.1)
Eosinophil #: 0 10*3/uL (ref 0.0–0.7)
Eosinophil %: 0.1 %
HCT: 50.4 % (ref 40.0–52.0)
HGB: 16.7 g/dL (ref 13.0–18.0)
LYMPHS PCT: 10.9 %
Lymphocyte #: 1.2 10*3/uL (ref 1.0–3.6)
MCH: 31.6 pg (ref 26.0–34.0)
MCHC: 33.2 g/dL (ref 32.0–36.0)
MCV: 95 fL (ref 80–100)
Monocyte #: 0.6 x10 3/mm (ref 0.2–1.0)
Monocyte %: 5.4 %
NEUTROS PCT: 83.1 %
Neutrophil #: 9.5 10*3/uL — ABNORMAL HIGH (ref 1.4–6.5)
Platelet: 387 10*3/uL (ref 150–440)
RBC: 5.3 10*6/uL (ref 4.40–5.90)
RDW: 14 % (ref 11.5–14.5)
WBC: 11.4 10*3/uL — ABNORMAL HIGH (ref 3.8–10.6)

## 2014-04-03 LAB — SALICYLATE LEVEL: SALICYLATES, SERUM: 5.2 mg/dL — AB

## 2014-04-03 LAB — COMPREHENSIVE METABOLIC PANEL
ANION GAP: 8 (ref 7–16)
AST: 25 U/L (ref 15–37)
Albumin: 4.3 g/dL (ref 3.4–5.0)
Alkaline Phosphatase: 97 U/L (ref 46–116)
BILIRUBIN TOTAL: 0.5 mg/dL (ref 0.2–1.0)
BUN: 11 mg/dL (ref 7–18)
CO2: 28 mmol/L (ref 21–32)
Calcium, Total: 9.4 mg/dL (ref 8.5–10.1)
Chloride: 106 mmol/L (ref 98–107)
Creatinine: 1.14 mg/dL (ref 0.60–1.30)
EGFR (Non-African Amer.): >60
Glucose: 121 mg/dL — ABNORMAL HIGH (ref 65–99)
Osmolality: 284 (ref 275–301)
POTASSIUM: 3.8 mmol/L (ref 3.5–5.1)
SGPT (ALT): 23 U/L (ref 14–63)
SODIUM: 142 mmol/L (ref 136–145)
Total Protein: 7.7 g/dL (ref 6.4–8.2)

## 2014-04-03 LAB — URINALYSIS, COMPLETE
Bacteria: NONE SEEN
Bilirubin,UR: NEGATIVE
Blood: NEGATIVE
Glucose,UR: NEGATIVE mg/dL (ref 0–75)
Ketone: NEGATIVE
LEUKOCYTE ESTERASE: NEGATIVE
Nitrite: NEGATIVE
PH: 5 (ref 4.5–8.0)
Specific Gravity: 1.033 (ref 1.003–1.030)
Squamous Epithelial: NONE SEEN
WBC UR: 5 /HPF (ref 0–5)

## 2014-04-03 LAB — ACETAMINOPHEN LEVEL: Acetaminophen: 5 ug/mL — ABNORMAL LOW

## 2014-04-03 LAB — ETHANOL: Ethanol: <3 mg/dL

## 2014-04-16 IMAGING — CR DG CHEST 2V
2 series · 2 of 2 positions shown · non-contrast
Comparison: 10/12/2012.

CLINICAL DATA: Chest pain.

CHEST - 2 VIEW

[w chest pa]
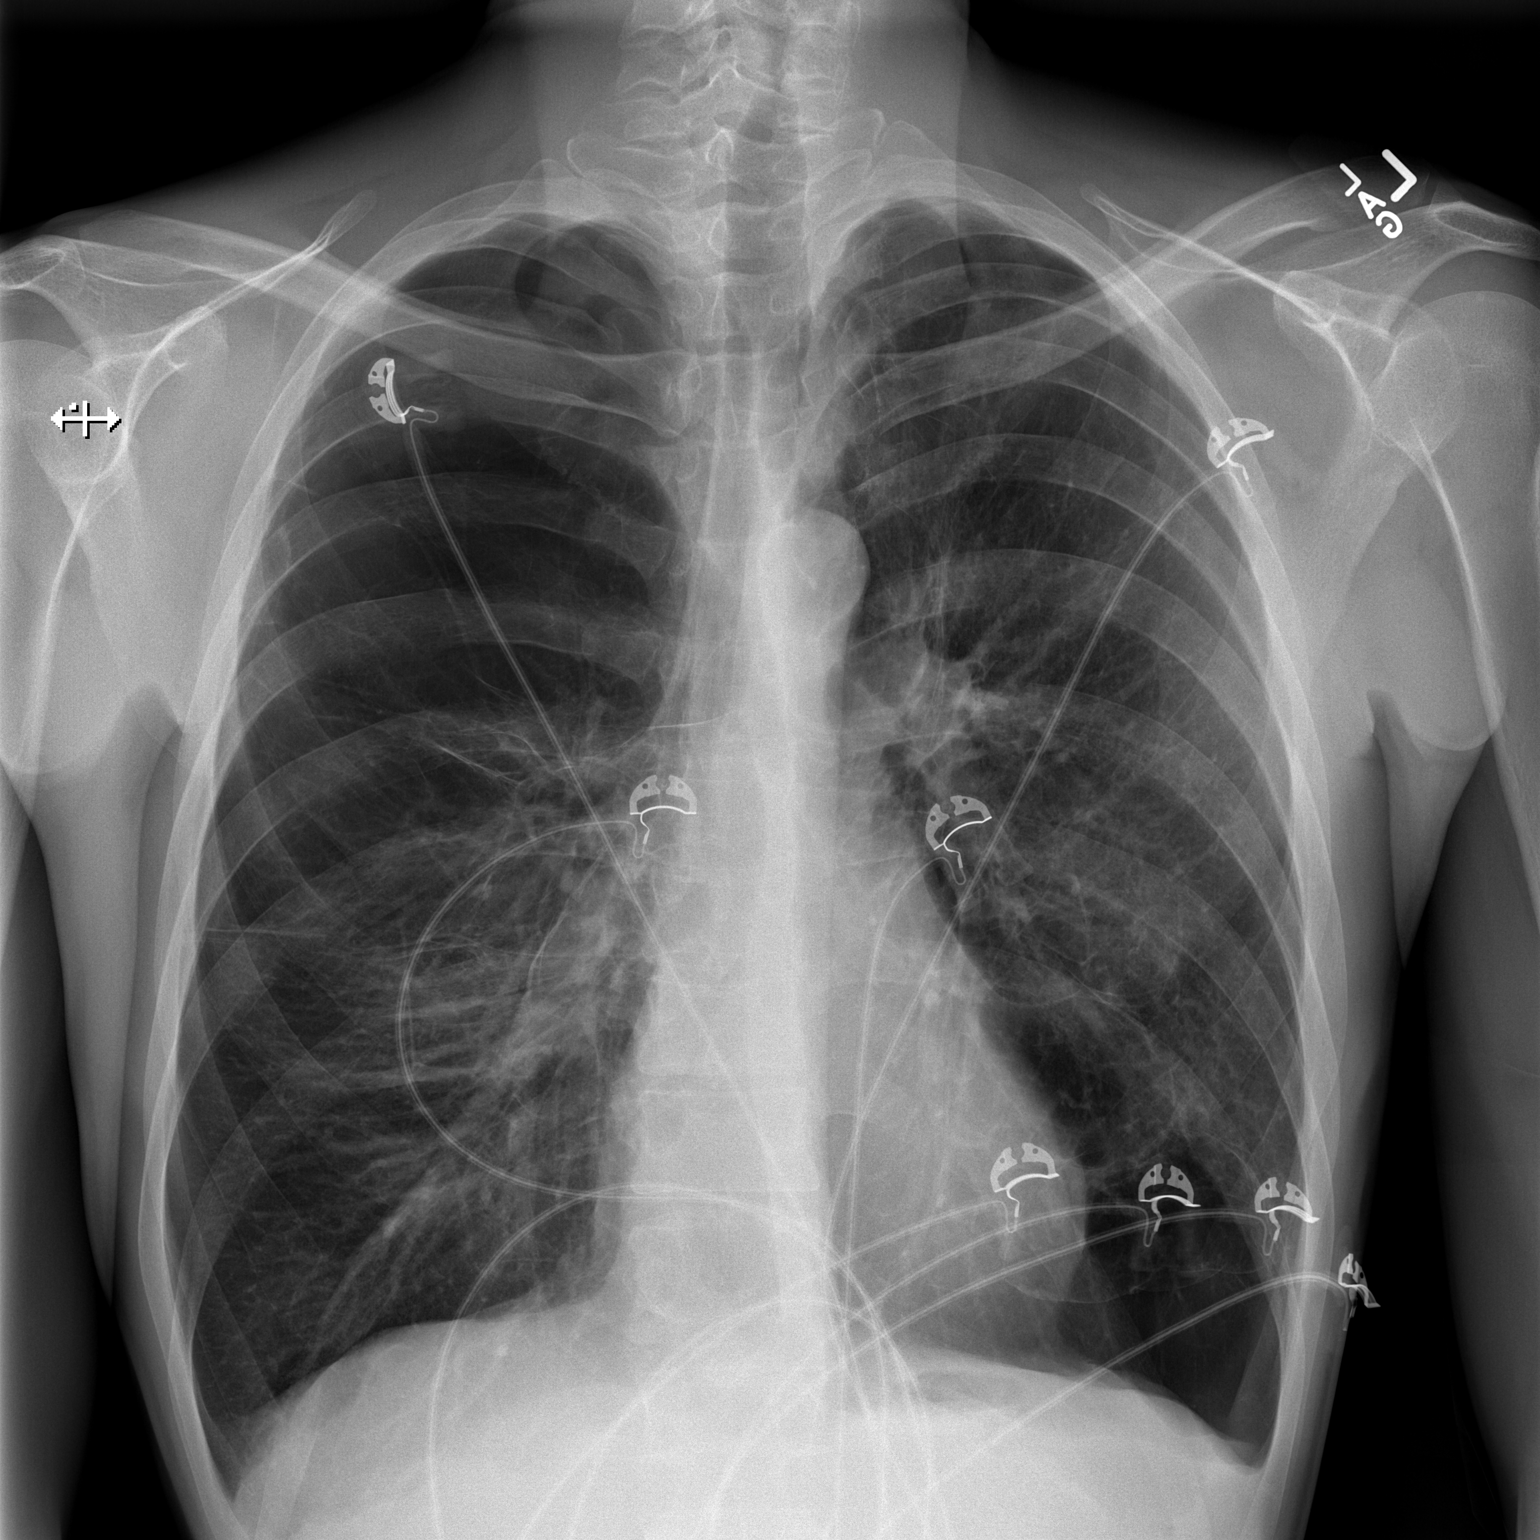

[w chest lat]
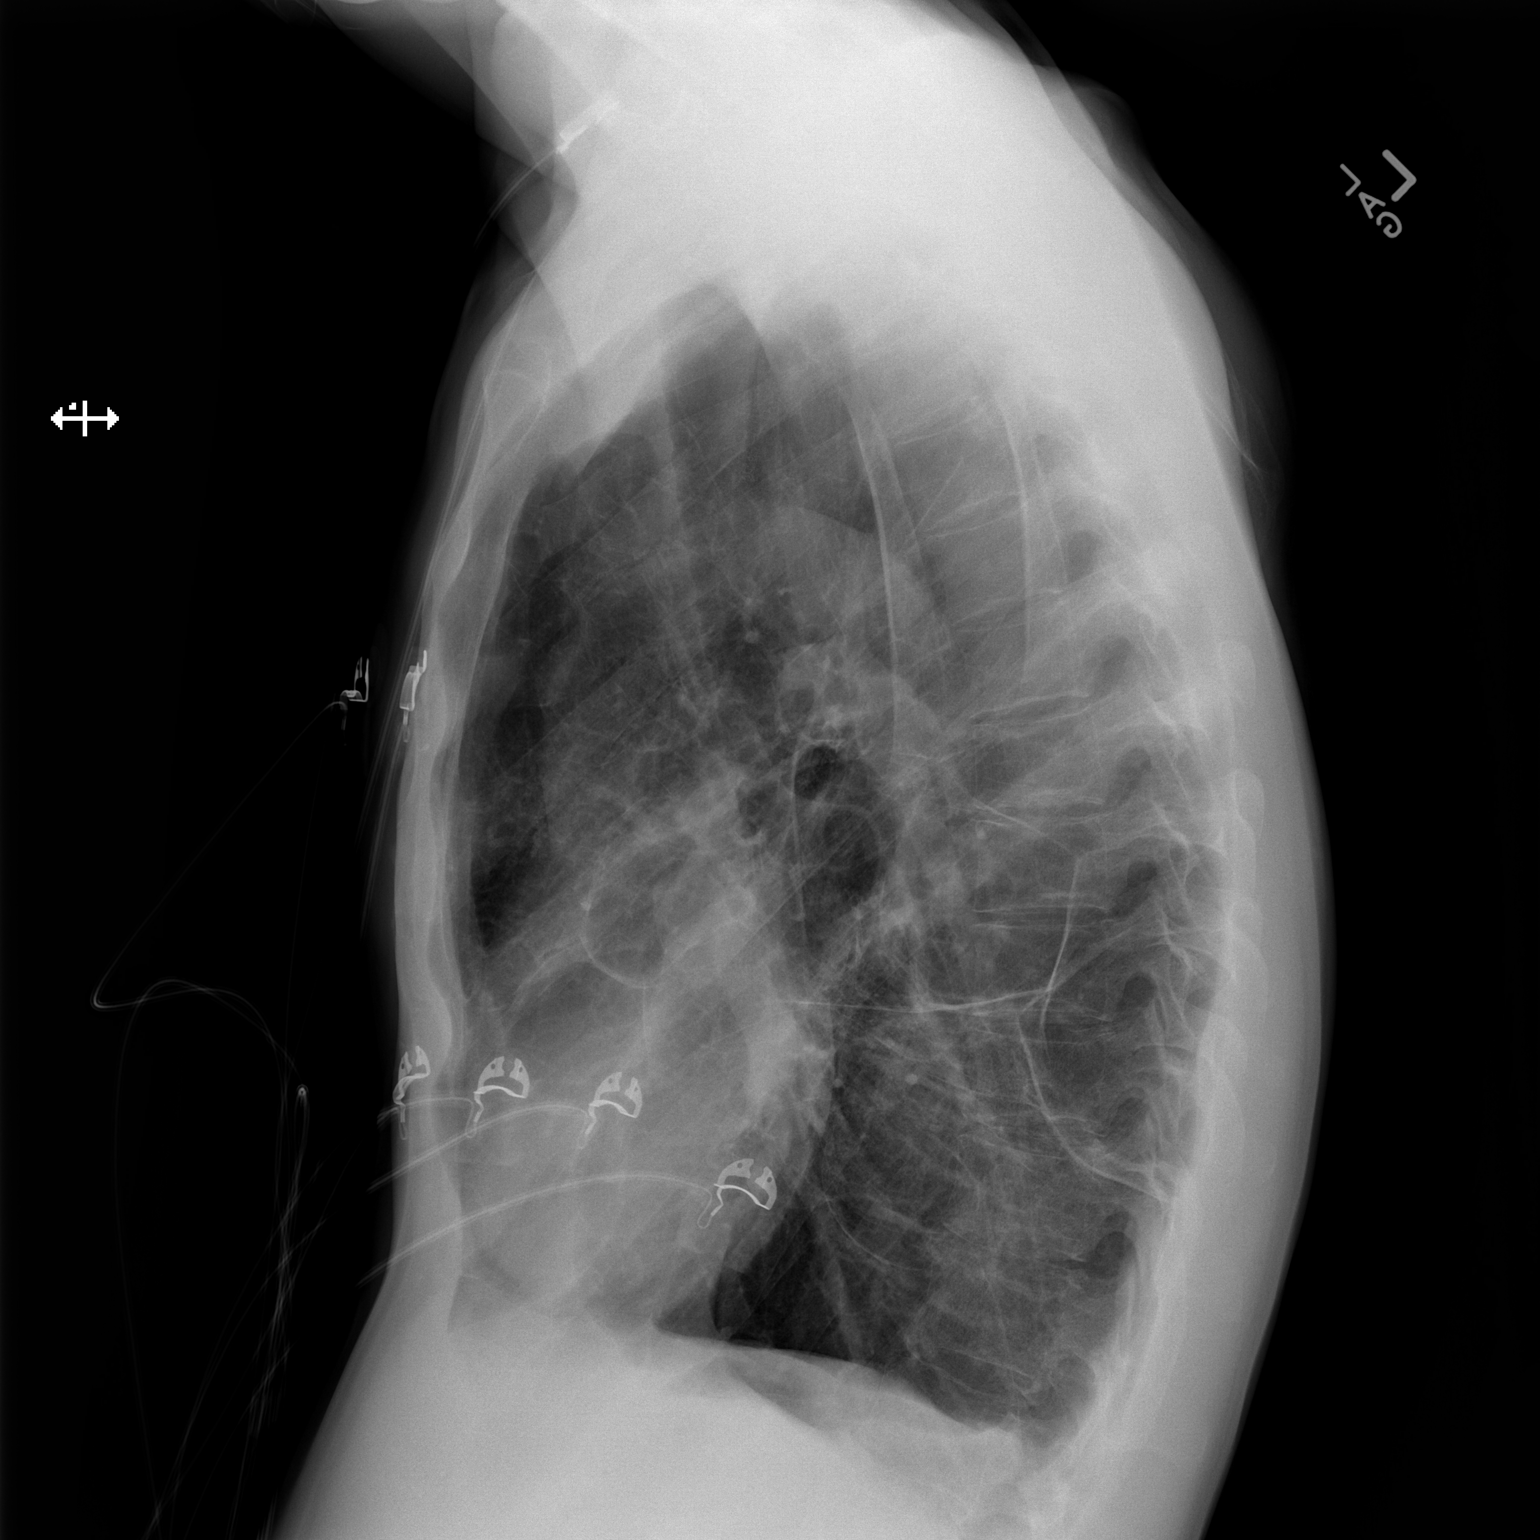

[2 of 2 positions shown; findings below may reference images not displayed]

FINDINGS: Severe emphysema, worse at the right apex and left base.
The lungs are hyperinflated.  No infiltrate, effusion, or evidence
of pneumothorax. The severe emphysema decreases sensitivity for
detecting pneumothorax.

Normal heart size and mediastinal contours.  No acute osseous
findings.
IMPRESSION: 1.  No acute change from 10/12/2012.
2.  Severe emphysema.  Given the patient's young age, there [DATE] antitrypsin deficiency.

## 2014-12-17 ENCOUNTER — Emergency Department (HOSPITAL_BASED_OUTPATIENT_CLINIC_OR_DEPARTMENT_OTHER)
Admission: EM | Admit: 2014-12-17 | Discharge: 2014-12-17 | Disposition: A | Discharge status: Home / Self Care | Payer: 59 | Attending: Emergency Medicine | Admitting: Emergency Medicine

## 2014-12-17 ENCOUNTER — Encounter (HOSPITAL_BASED_OUTPATIENT_CLINIC_OR_DEPARTMENT_OTHER): Payer: Self-pay | Admitting: *Deleted

## 2014-12-17 ENCOUNTER — Emergency Department (HOSPITAL_BASED_OUTPATIENT_CLINIC_OR_DEPARTMENT_OTHER): Payer: 59

## 2014-12-17 DIAGNOSIS — Z720 Tobacco use: Secondary | ICD-10-CM | POA: Insufficient documentation

## 2014-12-17 DIAGNOSIS — J441 Chronic obstructive pulmonary disease with (acute) exacerbation: Secondary | ICD-10-CM | POA: Insufficient documentation

## 2014-12-17 DIAGNOSIS — Z8739 Personal history of other diseases of the musculoskeletal system and connective tissue: Secondary | ICD-10-CM | POA: Insufficient documentation

## 2014-12-17 DIAGNOSIS — G8929 Other chronic pain: Secondary | ICD-10-CM | POA: Insufficient documentation

## 2014-12-17 MED ORDER — PREDNISONE 10 MG PO TABS: 60.0000 mg | ORAL_TABLET | Freq: Every day | ORAL | Status: DC

## 2014-12-17 MED ORDER — ONDANSETRON HCL 4 MG/2ML IJ SOLN
4.0000 mg | Freq: Once | INTRAMUSCULAR | Status: AC
  Administered 2014-12-17: 4 mg via INTRAVENOUS
  Filled 2014-12-17: qty 2

## 2014-12-17 MED ORDER — AZITHROMYCIN 250 MG PO TABS: ORAL_TABLET | ORAL | Status: DC

## 2014-12-17 MED ORDER — SODIUM CHLORIDE 0.9 % IV BOLUS (SEPSIS)
1000.0000 mL | Freq: Once | INTRAVENOUS | Status: AC
  Administered 2014-12-17: 1000 mL via INTRAVENOUS

## 2014-12-17 MED ORDER — PREDNISONE 50 MG PO TABS
60.0000 mg | ORAL_TABLET | Freq: Once | ORAL | Status: AC
  Administered 2014-12-17: 60 mg via ORAL
  Filled 2014-12-17 (×2): qty 1

## 2014-12-17 NOTE — Discharge Instructions (Signed)
Take your antibiotics and steroids as prescribed. Please follow up with your primary care provider within one week. Return to the ER for worsening fever, cough, chest pain, difficulty breathing, etc.  Please obtain all of your results from medical records or have your doctors office obtain the results - share them with your doctor - you should be seen at your doctors office in the next 2 days. Call today to arrange your follow up. Take the medications as prescribed. Please review all of the medicines and only take them if you do not have an allergy to them. Please be aware that if you are taking birth control pills, taking other prescriptions, ESPECIALLY ANTIBIOTICS may make the birth control ineffective - if this is the case, either do not engage in sexual activity or use alternative methods of birth control such as condoms until you have finished the medicine and your family doctor says it is OK to restart them. If you are on a blood thinner such as COUMADIN, be aware that any other medicine that you take may cause the coumadin to either work too much, or not enough - you should have your coumadin level rechecked in next 7 days if this is the case.  ?  It is also a possibility that you have an allergic reaction to any of the medicines that you have been prescribed - Everybody reacts differently to medications and while MOST people have no trouble with most medicines, you may have a reaction such as nausea, vomiting, rash, swelling, shortness of breath. If this is the case, please stop taking the medicine immediately and contact your physician.  ?  You should return to the ER if you develop severe or worsening symptoms.

## 2014-12-17 NOTE — ED Notes (Signed)
URI x 1-2 weeks.  Fever, productive cough.

## 2014-12-17 NOTE — ED Provider Notes (Signed)
CSN: [id number]     Arrival date & time 12/17/14  1854 History   First MD Initiated Contact with Patient 12/17/14 1918     Chief Complaint  Patient presents with  . URI   HPI  [name] is a 39 y.o. male with history of COPD and narcotic abuse who presents to the ED for evaluation of cough and fever. He states that since 10/18 he has had increased cough productive of yellow sputum. States the cough and sputum are different than his baseline COPD cough. He states he has been using his symbicort daily which provides some relief. He also states that he has had fever in the low 100s at home. He is afebrile here today. Endorses chills and nausea but denies vomiting, diarrhea, abd pain. Denies SOB different than his baseline COPD. Denies chest pain. Denies illicit drug use. Endorses smoking 0.5 PPD.   Past Medical History  Diagnosis Date  . Degenerative disc disease   . Chronic back pain   . Pneumothorax on left   . Emphysema/COPD [facility])    Past Surgical History  Procedure Laterality Date  . Lung surgery      Dr Arlyce Dice-- left lung  . Lung inflation      left lung  . Nose surgery      broken nose   Family History  Problem Relation Age of Onset  . Alpha-1 antitrypsin deficiency Mother   . Emphysema Mother   . Asthma Mother    Social History  Substance Use Topics  . Smoking status: Current Every Day Smoker -- 0.50 packs/day for 12 years    Types: Cigarettes  . Smokeless tobacco: None  . Alcohol Use: No    Review of Systems  All other systems reviewed and are negative.     Allergies  Effexor; Hydrocodone-acetaminophen; Ibuprofen; Lyrica; Neurontin; Nsaids; and Tramadol hcl  Home Medications   Prior to Admission medications   Medication Sig Start Date End Date Taking? Authorizing Provider  azithromycin (ZITHROMAX) 250 MG tablet Take first 2 tablets on the first day, and then take 1 tablet every day until you finish the bottle. 12/17/14   Olivia Canter Zebulin Siegel, PA-C  predniSONE  (DELTASONE) 10 MG tablet Take 6 tablets (60 mg total) by mouth daily. 12/17/14   Olivia Canter Tanysha Quant, PA-C   BP 116/83 mmHg  Pulse 80  Temp(Src) 98.3 F (36.8 C) (Oral)  Resp 14  Ht 6\' 1"  (1.854 m)  Wt 155 lb (70.308 kg)  BMI 20.45 kg/m2  SpO2 99% Physical Exam  Constitutional: He is oriented to person, place, and time.  Appears tired, pale, but NAD.   HENT:  Right Ear: External ear normal.  Left Ear: External ear normal.  Nose: Nose normal.  Mouth/Throat: Oropharynx is clear and moist. No oropharyngeal exudate.  Eyes: Conjunctivae and EOM are normal. Pupils are equal, round, and reactive to light.  Neck: Full passive range of motion without pain.  Posterior oropharynx mildly erythematous but no discarge.   Cardiovascular: Normal rate, regular rhythm, normal heart sounds and intact distal pulses.   No murmur heard. Pulmonary/Chest: Effort normal. No accessory muscle usage. No respiratory distress.  Bilateral diffuse rhonchi and congestion. No wheezing appreciable.   Abdominal: Soft. Bowel sounds are normal. He exhibits no distension. There is no tenderness. There is no rebound and no guarding.  Musculoskeletal: He exhibits no edema or tenderness.  Lymphadenopathy:    He has no cervical adenopathy.  Neurological: He is alert and oriented to person,  place, and time. No cranial nerve deficit.  Skin: Skin is warm and dry.  Nursing note and vitals reviewed.   ED Course  Procedures (including critical care time) Labs Review Labs Reviewed - No data to display  Imaging Review Dg Chest 2 View  12/17/2014  CLINICAL DATA:  Cough and fever for 1-2 weeks EXAM: CHEST - 2 VIEW COMPARISON:  04/02/2014 FINDINGS: Cardiac shadow is stable. Severe emphysematous changes are again identified. No focal infiltrate or sizable effusion is noted. Hyperinflation is seen. No bony abnormality is noted. IMPRESSION: COPD without acute abnormality. Electronically Signed   By: Inez Catalina M.D.   On: 12/17/2014  19:51   I have personally reviewed and evaluated these images and lab results as part of my medical decision-making.   EKG Interpretation None      MDM   Final diagnoses:  COPD exacerbation ([place])    Pt was initially tachycardic and nauseated so gave 1L NS and zofran. Vitals normalized. CXR shows COPD with no acute abnormalities. Given cough and sputum production different than normal, I believe this to be a COPD exacerbation and with prophylatically treat with abx. Discussed with pt and due to finances with send home with rx for azithromycin rather than doxycycline. No further lab or imaging indicated at this time. Return precautions given.     Anne Ng, PA-C 12/17/14 [place], MD 12/19/14 [phone]

## 2015-01-06 ENCOUNTER — Encounter (HOSPITAL_COMMUNITY): Payer: Self-pay

## 2015-01-06 ENCOUNTER — Encounter (HOSPITAL_COMMUNITY): Payer: Self-pay | Admitting: Intensive Care

## 2015-01-06 ENCOUNTER — Observation Stay (HOSPITAL_COMMUNITY)
Admission: AD | Admit: 2015-01-06 | Discharge: 2015-01-08 | Disposition: A | Discharge status: Home / Self Care | Payer: Federal, State, Local not specified - Other | Source: Intra-hospital | Attending: Psychiatry | Admitting: Psychiatry

## 2015-01-06 ENCOUNTER — Emergency Department (HOSPITAL_COMMUNITY)
Admission: EM | Admit: 2015-01-06 | Discharge: 2015-01-06 | Disposition: A | Discharge status: Other Acute Inpatient Hospital | Payer: 59 | Attending: Emergency Medicine | Admitting: Emergency Medicine

## 2015-01-06 DIAGNOSIS — F1110 Opioid abuse, uncomplicated: Secondary | ICD-10-CM

## 2015-01-06 DIAGNOSIS — J449 Chronic obstructive pulmonary disease, unspecified: Secondary | ICD-10-CM | POA: Insufficient documentation

## 2015-01-06 DIAGNOSIS — R000 Tachycardia, unspecified: Secondary | ICD-10-CM | POA: Insufficient documentation

## 2015-01-06 DIAGNOSIS — F332 Major depressive disorder, recurrent severe without psychotic features: Secondary | ICD-10-CM | POA: Diagnosis present

## 2015-01-06 DIAGNOSIS — F419 Anxiety disorder, unspecified: Secondary | ICD-10-CM | POA: Insufficient documentation

## 2015-01-06 DIAGNOSIS — F1193 Opioid use, unspecified with withdrawal: Secondary | ICD-10-CM

## 2015-01-06 DIAGNOSIS — G8929 Other chronic pain: Secondary | ICD-10-CM | POA: Insufficient documentation

## 2015-01-06 DIAGNOSIS — Z885 Allergy status to narcotic agent status: Secondary | ICD-10-CM | POA: Insufficient documentation

## 2015-01-06 DIAGNOSIS — F13239 Sedative, hypnotic or anxiolytic dependence with withdrawal, unspecified: Secondary | ICD-10-CM | POA: Insufficient documentation

## 2015-01-06 DIAGNOSIS — Z888 Allergy status to other drugs, medicaments and biological substances status: Secondary | ICD-10-CM | POA: Insufficient documentation

## 2015-01-06 DIAGNOSIS — Z886 Allergy status to analgesic agent status: Secondary | ICD-10-CM | POA: Insufficient documentation

## 2015-01-06 DIAGNOSIS — F1123 Opioid dependence with withdrawal: Secondary | ICD-10-CM | POA: Insufficient documentation

## 2015-01-06 DIAGNOSIS — F17210 Nicotine dependence, cigarettes, uncomplicated: Secondary | ICD-10-CM | POA: Insufficient documentation

## 2015-01-06 DIAGNOSIS — M549 Dorsalgia, unspecified: Secondary | ICD-10-CM | POA: Insufficient documentation

## 2015-01-06 DIAGNOSIS — F1320 Sedative, hypnotic or anxiolytic dependence, uncomplicated: Secondary | ICD-10-CM | POA: Diagnosis present

## 2015-01-06 DIAGNOSIS — F1210 Cannabis abuse, uncomplicated: Secondary | ICD-10-CM | POA: Insufficient documentation

## 2015-01-06 DIAGNOSIS — F1010 Alcohol abuse, uncomplicated: Secondary | ICD-10-CM | POA: Insufficient documentation

## 2015-01-06 DIAGNOSIS — Z7952 Long term (current) use of systemic steroids: Secondary | ICD-10-CM | POA: Insufficient documentation

## 2015-01-06 DIAGNOSIS — R45851 Suicidal ideations: Secondary | ICD-10-CM | POA: Insufficient documentation

## 2015-01-06 DIAGNOSIS — Z8739 Personal history of other diseases of the musculoskeletal system and connective tissue: Secondary | ICD-10-CM | POA: Insufficient documentation

## 2015-01-06 LAB — RAPID URINE DRUG SCREEN, HOSP PERFORMED
Amphetamines: NOT DETECTED
Barbiturates: NOT DETECTED
Benzodiazepines: POSITIVE — AB
COCAINE: NOT DETECTED
OPIATES: POSITIVE — AB
TETRAHYDROCANNABINOL: POSITIVE — AB

## 2015-01-06 LAB — CBC WITH DIFFERENTIAL/PLATELET
BASOS PCT: 1 %
Basophils Absolute: 0.1 10*3/uL (ref 0.0–0.1)
Eosinophils Absolute: 0.1 10*3/uL (ref 0.0–0.7)
Eosinophils Relative: 1 %
HEMATOCRIT: 46.3 % (ref 39.0–52.0)
Hemoglobin: 15.9 g/dL (ref 13.0–17.0)
Lymphocytes Relative: 15 %
Lymphs Abs: 1.5 10*3/uL (ref 0.7–4.0)
MCH: 33.3 pg (ref 26.0–34.0)
MCHC: 34.3 g/dL (ref 30.0–36.0)
MCV: 96.9 fL (ref 78.0–100.0)
MONO ABS: 0.6 10*3/uL (ref 0.1–1.0)
MONOS PCT: 5 %
NEUTROS ABS: 7.9 10*3/uL — AB (ref 1.7–7.7)
Neutrophils Relative %: 78 %
Platelets: 338 10*3/uL (ref 150–400)
RBC: 4.78 MIL/uL (ref 4.22–5.81)
RDW: 13.9 % (ref 11.5–15.5)
WBC: 10.1 10*3/uL (ref 4.0–10.5)

## 2015-01-06 LAB — COMPREHENSIVE METABOLIC PANEL
ALBUMIN: 4.3 g/dL (ref 3.5–5.0)
ALT: 13 U/L — ABNORMAL LOW (ref 17–63)
ANION GAP: 8 (ref 5–15)
AST: 15 U/L (ref 15–41)
Alkaline Phosphatase: 97 U/L (ref 38–126)
BUN: 10 mg/dL (ref 6–20)
CALCIUM: 9.2 mg/dL (ref 8.9–10.3)
CO2: 23 mmol/L (ref 22–32)
Chloride: 107 mmol/L (ref 101–111)
Creatinine, Ser: 0.99 mg/dL (ref 0.61–1.24)
GFR calc non Af Amer: >60 mL/min (ref >60)
GLUCOSE: 100 mg/dL — AB (ref 65–99)
POTASSIUM: 3.8 mmol/L (ref 3.5–5.1)
Sodium: 138 mmol/L (ref 135–145)
TOTAL PROTEIN: 7 g/dL (ref 6.5–8.1)
Total Bilirubin: 0.8 mg/dL (ref 0.3–1.2)

## 2015-01-06 LAB — ETHANOL: Alcohol, Ethyl (B): <5 mg/dL (ref <5)

## 2015-01-06 LAB — LIPASE, BLOOD: Lipase: 24 U/L (ref 11–51)

## 2015-01-06 MED ORDER — ALUM & MAG HYDROXIDE-SIMETH 200-200-20 MG/5ML PO SUSP: 30.0000 mL | ORAL | Status: DC | PRN

## 2015-01-06 MED ORDER — ONDANSETRON HCL 4 MG PO TABS: 4.0000 mg | ORAL_TABLET | Freq: Three times a day (TID) | ORAL | Status: DC | PRN

## 2015-01-06 MED ORDER — NICOTINE 21 MG/24HR TD PT24
21.0000 mg | MEDICATED_PATCH | Freq: Every day | TRANSDERMAL | Status: DC
  Administered 2015-01-07 – 2015-01-08 (×2): 21 mg via TRANSDERMAL
  Filled 2015-01-06 (×2): qty 1

## 2015-01-06 MED ORDER — LORAZEPAM 1 MG PO TABS
0.0000 mg | ORAL_TABLET | Freq: Four times a day (QID) | ORAL | Status: DC
  Administered 2015-01-06: 1 mg via ORAL
  Filled 2015-01-06: qty 1

## 2015-01-06 MED ORDER — ZOLPIDEM TARTRATE 5 MG PO TABS: 5.0000 mg | ORAL_TABLET | Freq: Every evening | ORAL | Status: DC | PRN

## 2015-01-06 MED ORDER — LOPERAMIDE HCL 2 MG PO CAPS
ORAL_CAPSULE | ORAL | Status: AC
  Administered 2015-01-06: 2 mg
  Filled 2015-01-06: qty 2

## 2015-01-06 MED ORDER — SODIUM CHLORIDE 0.9 % IV SOLN
INTRAVENOUS | Status: DC
  Administered 2015-01-06: 11:00:00 via INTRAVENOUS

## 2015-01-06 MED ORDER — MAGNESIUM HYDROXIDE 400 MG/5ML PO SUSP: 30.0000 mL | Freq: Every day | ORAL | Status: DC | PRN

## 2015-01-06 MED ORDER — LORAZEPAM 2 MG/ML IJ SOLN
1.0000 mg | Freq: Once | INTRAMUSCULAR | Status: AC
  Administered 2015-01-06: 1 mg via INTRAVENOUS
  Filled 2015-01-06: qty 1

## 2015-01-06 MED ORDER — LORAZEPAM 1 MG PO TABS: 0.0000 mg | ORAL_TABLET | Freq: Two times a day (BID) | ORAL | Status: DC

## 2015-01-06 MED ORDER — TRAZODONE HCL 50 MG PO TABS
50.0000 mg | ORAL_TABLET | Freq: Every evening | ORAL | Status: DC | PRN
  Administered 2015-01-06: 50 mg via ORAL
  Filled 2015-01-06: qty 1

## 2015-01-06 MED ORDER — ONDANSETRON HCL 4 MG/2ML IJ SOLN
4.0000 mg | Freq: Once | INTRAMUSCULAR | Status: AC
  Administered 2015-01-06: 4 mg via INTRAVENOUS
  Filled 2015-01-06: qty 2

## 2015-01-06 MED ORDER — MELOXICAM 7.5 MG PO TABS
7.5000 mg | ORAL_TABLET | Freq: Once | ORAL | Status: AC
  Administered 2015-01-06: 7.5 mg via ORAL
  Filled 2015-01-06: qty 1

## 2015-01-06 MED ORDER — LORAZEPAM 1 MG PO TABS
0.0000 mg | ORAL_TABLET | Freq: Four times a day (QID) | ORAL | Status: DC
  Administered 2015-01-06 – 2015-01-07 (×3): 1 mg via ORAL
  Filled 2015-01-06 (×3): qty 1

## 2015-01-06 MED ORDER — NICOTINE 21 MG/24HR TD PT24
21.0000 mg | MEDICATED_PATCH | Freq: Every day | TRANSDERMAL | Status: DC
  Administered 2015-01-06: 21 mg via TRANSDERMAL
  Filled 2015-01-06: qty 1

## 2015-01-06 MED ORDER — LOPERAMIDE HCL 2 MG PO CAPS
2.0000 mg | ORAL_CAPSULE | ORAL | Status: DC | PRN
Start: 2015-01-06 — End: 2015-01-07
  Administered 2015-01-07: 2 mg via ORAL
  Filled 2015-01-06: qty 1

## 2015-01-06 MED ORDER — SODIUM CHLORIDE 0.9 % IV BOLUS (SEPSIS)
1000.0000 mL | Freq: Once | INTRAVENOUS | Status: AC
  Administered 2015-01-06: 1000 mL via INTRAVENOUS

## 2015-01-06 NOTE — Progress Notes (Signed)
Patient is a 39 year old Caucasian male admitted to OBS unit from [facility]. Patient presents with request to detox from both benzodiazepines (klonopin) and opiates (hydrocodone). Patient states he was prescribed the klonopin by his psychiatrist for anxiety and prescribed his oxycodone by his provider at the pain clinic for chronic back pain. Patient states that he "does not abuse these drugs" but states that he wants to detox because he is recently unemployed and has lost his insurance. Patient reports he does "not have the money" to afford outpatient appointments and month medication costs. Patient reports using 2mg  of klonopin per day with his last use being four days ago and also reports using 50-60mg  of oxycodone per day with his last use being one day ago. Patient reports varying withdrawal symptoms including nausea, vomiting, diarrhea, and paresthesia of the arms. Patient currently has passive SI with no plan but denies HI and auditory and visual hallucinations. Patient belongings placed in locker 24. Patient escorted to observation unit by RN. Will continue to monitor patient for safety.

## 2015-01-06 NOTE — ED Notes (Signed)
RN at bedside drawing labs

## 2015-01-06 NOTE — ED Provider Notes (Signed)
CSN: [id number]     Arrival date & time 01/06/15  0913 History   First MD Initiated Contact with Patient 01/06/15 [phone]     Chief Complaint  Patient presents with  . Withdrawal     (Consider location/radiation/quality/duration/timing/severity/associated sxs/prior Treatment) HPI Comments: Patient here complaining of withdrawal symptoms consisting of diarrhea and vomiting that occurred yesterday. No fever or chills. Has suicidal thoughts without a plan. Patient is to abusing benzodiazepines as well as opiates. Last use of both medications was yesterday. Denies any prior history of withdrawal seizures. Denies the use of alcohol. Endorses increased depression but denies any homicidal ideations. No prior history of suicide attempt. Symptoms persistent and nothing makes them better. No treatment for this used prior to arrival  The history is provided by the patient.    Past Medical History  Diagnosis Date  . Degenerative disc disease   . Chronic back pain   . Pneumothorax on left   . Emphysema/COPD [facility])    Past Surgical History  Procedure Laterality Date  . Lung surgery      Dr Arlyce Dice-- left lung  . Lung inflation      left lung  . Nose surgery      broken nose   Family History  Problem Relation Age of Onset  . Alpha-1 antitrypsin deficiency Mother   . Emphysema Mother   . Asthma Mother    Social History  Substance Use Topics  . Smoking status: Current Every Day Smoker -- 0.50 packs/day for 12 years    Types: Cigarettes  . Smokeless tobacco: None  . Alcohol Use: No    Review of Systems  All other systems reviewed and are negative.     Allergies  Effexor; Hydrocodone-acetaminophen; Ibuprofen; Lyrica; Neurontin; Nsaids; and Tramadol hcl  Home Medications   Prior to Admission medications   Medication Sig Start Date End Date Taking? Authorizing Provider  azithromycin (ZITHROMAX) 250 MG tablet Take first 2 tablets on the first day, and then take 1 tablet every day  until you finish the bottle. 12/17/14   Olivia Canter Sam, PA-C  predniSONE (DELTASONE) 10 MG tablet Take 6 tablets (60 mg total) by mouth daily. 12/17/14   Olivia Canter Sam, PA-C   BP 115/84 mmHg  Pulse 132  Temp(Src) 98 F (36.7 C) (Oral)  Resp 17  SpO2 99% Physical Exam  Constitutional: He is oriented to person, place, and time. He appears well-developed and well-nourished.  Non-toxic appearance. No distress.  HENT:  Head: Normocephalic and atraumatic.  Eyes: Conjunctivae, EOM and lids are normal. Pupils are equal, round, and reactive to light.  Neck: Normal range of motion. Neck supple. No tracheal deviation present. No thyroid mass present.  Cardiovascular: Regular rhythm and normal heart sounds.  Tachycardia present.  Exam reveals no gallop.   No murmur heard. Pulmonary/Chest: Effort normal and breath sounds normal. No stridor. No respiratory distress. He has no decreased breath sounds. He has no wheezes. He has no rhonchi. He has no rales.  Abdominal: Soft. Normal appearance and bowel sounds are normal. He exhibits no distension. There is no tenderness. There is no rebound and no CVA tenderness.  Musculoskeletal: Normal range of motion. He exhibits no edema or tenderness.  Neurological: He is alert and oriented to person, place, and time. He has normal strength. No cranial nerve deficit or sensory deficit. GCS eye subscore is 4. GCS verbal subscore is 5. GCS motor subscore is 6.  Skin: Skin is warm and dry. No abrasion and  no rash noted.  Psychiatric: His speech is normal and behavior is normal. His mood appears anxious.  Nursing note and vitals reviewed.   ED Course  Procedures (including critical care time) Labs Review Labs Reviewed  ETHANOL  URINE RAPID DRUG SCREEN, HOSP PERFORMED  CBC WITH DIFFERENTIAL/PLATELET  COMPREHENSIVE METABOLIC PANEL  LIPASE, BLOOD    Imaging Review No results found. I have personally reviewed and evaluated these images and lab results as part of my  medical decision-making.   EKG Interpretation None      MDM   Final diagnoses:  None    Patient has been medically cleared and awaiting disposition by psychiatry    Lacretia Leigh, MD 01/12/15 [phone]

## 2015-01-06 NOTE — ED Notes (Signed)
[place] reports unsuccessful IV attempt; this RN will attempt at present time.

## 2015-01-06 NOTE — Progress Notes (Addendum)
D: Patient complained of headache that wouldn't go away since he got in here around 9 am. Denies SI, AH/VH. Stated that he has not slept since 2 days and will like a sleeping pill. Heloise Purpura made aware - ordered one time of Meloxicam 7.5 mg. Patient later complained of diarrhea. Has gone 4 times since his admission. Manus Gunning Np notified - ordered Imodium 2 mg PRN. NO BEHAVIORAL ISSUES NOTED.  A: Offered support and encouragement as needed. Due meds/PRN given as prescribed. Every 15 minutes check for safety maintained. Will continue to monitor patient for safety and stability.  R: patient remains safe.

## 2015-01-06 NOTE — ED Notes (Signed)
PT LAST USE  Percocet yesterday evening Klonopin four days ago

## 2015-01-06 NOTE — BH Assessment (Signed)
Assessment Note  Brett Beck is an 39 y.o. male. Pt arrived to [facility] voluntarily. Pt reports suicidal thoughts for the past few days. Patient denies plan or intent. He has no history of prior suicide attempts.  Pt denies HI. Pt denies hallucinations except when withdrawing from substances. Pt states that he uses Klonopin and Oxycodone and would like detox. Pt states that he was prescribed Oxycodone for his back pain and Klonopin for anxiety. Pt sts that she takes her Klonopin as prescribed 4mg 's per day but only takes 1-2mg 's. Patient has taken Klonopin for the past 18 years and last use was 4 days ago. She denies abusing her Klonopin. Pt reports that he has been using Oxycodone for 7 years. He takes (4) 10mg  Oxycodone tablets. Pt reports withdrawal symptoms from Oxycodone and Klonopin. According to the Pt, he sweats, shakes, has dirreahea, loses his appetite, and has insominia. Patient has not slept well in sever days.. Pt denies any outpatient or inpatient treatment. Pt reports depressive symptoms. According to the Pt, he isolates himself, cries daily, and has loss interest in activities. Patient has received inpatient treatment at [facility] in the past (03/2014).  Diagnosis: Depressive Disorder NOS and Substance Abuse  Past Medical History:  Past Medical History  Diagnosis Date  . Degenerative disc disease   . Chronic back pain   . Pneumothorax on left   . Emphysema/COPD [facility])     Past Surgical History  Procedure Laterality Date  . Lung surgery      Dr Arlyce Dice-- left lung  . Lung inflation      left lung  . Nose surgery      broken nose    Family History:  Family History  Problem Relation Age of Onset  . Alpha-1 antitrypsin deficiency Mother   . Emphysema Mother   . Asthma Mother     Social History:  reports that he has been smoking Cigarettes.  He has a 6 pack-year smoking history. He does not have any smokeless tobacco history on file. He reports that he does not drink  alcohol or use illicit drugs.  Additional Social History:  Alcohol / Drug Use Pain Medications: SEE MAR Prescriptions: SEE MAR Over the Counter: SEE MAR History of alcohol / drug use?: Yes Withdrawal Symptoms: Agitation, Blackouts, Nausea / Vomiting, Irritability, Fever / Chills, Diarrhea, Weakness, Sweats Substance #1 Name of Substance 1: Klonopin 1 - Age of First Use: 39 yrs old  1 - Amount (size/oz): 1-2mg 's 1 - Frequency: daily  1 - Duration: on-going  1 - Last Use / Amount: 4 days ago Substance #2 Name of Substance 2: Oxycodone 2 - Age of First Use: 39 yrs old  2 - Amount (size/oz): (4) 10mg 's 2 - Frequency: daily  2 - Duration: on-going  2 - Last Use / Amount: 39 yrs old  CIWA: CIWA-Ar BP: 107/75 mmHg Pulse Rate: 102 COWS:    Allergies:  Allergies  Allergen Reactions  . Effexor [Venlafaxine] Diarrhea  . Hydrocodone-Acetaminophen     REACTION: severe headaches  . Ibuprofen Other (See Comments)    Severe stomach issues  . Lyrica [Pregabalin] Diarrhea    Swelling in the feet  . Neurontin [Gabapentin] Other (See Comments)    Severe stomach issues  . Nsaids Other (See Comments)    Severe stomach issues  . Tramadol Hcl     REACTION: diarrhea, abdominal pain    Home Medications:  (Not in a hospital admission)  OB/GYN Status:  No LMP for  male patient.  General Assessment Data Location of Assessment: [facility] ED TTS Assessment: In system Is this a Tele or Face-to-Face Assessment?: Face-to-Face Is this an Initial Assessment or a Re-assessment for this encounter?: Initial Assessment Marital status: Single Maiden name:  (n/a) Is patient pregnant?: No Pregnancy Status: No Living Arrangements: Other (Comment), Other relatives, Parent (mother and best friend) Can pt return to current living arrangement?: No Admission Status: Voluntary Is patient capable of signing voluntary admission?: Yes Referral Source: Self/Family/Friend Insurance type:  (Self Pay )  Medical  Screening Exam ([place]) Medical Exam completed: No Reason for MSE not completed:  (n/a)  Crisis Care Plan Living Arrangements: Other (Comment), Other relatives, Parent (mother and best friend) Name of Psychiatrist:  (None reported) Name of Therapist:  (None reported)  Education Status Is patient currently in school?: No Current Grade:  (n/a) Highest grade of school patient has completed:  (n/a) Name of school:  (n/a) Contact person:  (n/a)  Risk to self with the past 6 months Suicidal Ideation: Yes-Currently Present Has patient been a risk to self within the past 6 months prior to admission? : Yes Suicidal Intent: No Has patient had any suicidal intent within the past 6 months prior to admission? : No Is patient at risk for suicide?: No Suicidal Plan?: No Has patient had any suicidal plan within the past 6 months prior to admission? : No Access to Means: No What has been your use of drugs/alcohol within the last 12 months?:  (patient reports Klonopin and Oxycodone use ) Previous Attempts/Gestures: No How many times?:  (n/a) Other Self Harm Risks:  (n/a) Triggers for Past Attempts:  (n/a) Intentional Self Injurious Behavior: None Family Suicide History: Yes (maternal grandmother-"psychosis") Recent stressful life event(s): Legal Issues Persecutory voices/beliefs?: No Depression: Yes Depression Symptoms: Feeling angry/irritable, Feeling worthless/self pity, Loss of interest in usual pleasures, Guilt, Fatigue, Isolating, Despondent, Tearfulness, Insomnia Substance abuse history and/or treatment for substance abuse?: No Suicide prevention information given to non-admitted patients: Not applicable  Risk to Others within the past 6 months Homicidal Ideation: No Does patient have any lifetime risk of violence toward others beyond the six months prior to admission? : No Thoughts of Harm to Others: No Current Homicidal Intent: No Current Homicidal Plan: No Access to  Homicidal Means: No Identified Victim:  (n/a) History of harm to others?: No Assessment of Violence: None Noted Violent Behavior Description:  (patient calm and cooperative ) Does patient have access to weapons?: No Criminal Charges Pending?: No Does patient have a court date: No Is patient on probation?: No  Psychosis Hallucinations: None noted Delusions: None noted  Mental Status Report Appearance/Hygiene: Disheveled Eye Contact: Poor Motor Activity: Freedom of movement Speech: Logical/coherent Level of Consciousness: Alert Mood: Depressed Affect: Appropriate to circumstance Anxiety Level: Minimal Thought Processes: Relevant Judgement: Impaired Orientation: Person, Time, Place Obsessive Compulsive Thoughts/Behaviors: None  Cognitive Functioning Concentration: Decreased Memory: Recent Intact, Remote Intact IQ: Average Insight: Fair Impulse Control: Good Appetite: Fair Weight Loss:  (none reported) Weight Gain:  (none reported) Sleep: Decreased Total Hours of Sleep:  (varies ) Vegetative Symptoms: None  ADLScreening [facility] Assessment Services) Patient's cognitive ability adequate to safely complete daily activities?: Yes Patient able to express need for assistance with ADLs?: Yes Independently performs ADLs?: Yes (appropriate for developmental age)  Prior Inpatient Therapy Prior Inpatient Therapy: Yes Prior Therapy Dates:  (current) Prior Therapy Facilty/Provider(s):  ([facility] ) Reason for Treatment:  (03/2013)  Prior Outpatient Therapy Prior Outpatient Therapy: No Prior  Therapy Dates:  (n/a) Prior Therapy Facilty/Provider(s):  (n/a) Reason for Treatment:  (n/a) Does patient have an ACCT team?: No Does patient have Intensive In-House Services?  : No Does patient have [facility] services? : No Does patient have [facility] services?: No  ADL Screening (condition at time of admission) Patient's cognitive ability adequate to safely complete daily activities?:  Yes Is the patient deaf or have difficulty hearing?: No Does the patient have difficulty seeing, even when wearing glasses/contacts?: No Does the patient have difficulty concentrating, remembering, or making decisions?: Yes Patient able to express need for assistance with ADLs?: Yes Does the patient have difficulty dressing or bathing?: No Independently performs ADLs?: Yes (appropriate for developmental age) Does the patient have difficulty walking or climbing stairs?: No Weakness of Legs: None Weakness of Arms/Hands: None  Home Assistive Devices/Equipment Home Assistive Devices/Equipment: None    Abuse/Neglect Assessment (Assessment to be complete while patient is alone) Physical Abuse: Denies Verbal Abuse: Denies Sexual Abuse: Denies Exploitation of patient/patient's resources: Denies Self-Neglect: Denies Values / Beliefs Cultural Requests During Hospitalization: None Spiritual Requests During Hospitalization: None   Advance Directives (For Healthcare) Does patient have an advance directive?: No Would patient like information on creating an advanced directive?: No - patient declined information    Additional Information 1:1 In Past 12 Months?: No CIRT Risk: No Elopement Risk: No Does patient have medical clearance?: No     Disposition:  Disposition Initial Assessment Completed for this Encounter: Yes Disposition of Patient: Other dispositions (Per Reginold Agent, NP patient meets criteria for the obs unit) Other disposition(s): Other (Comment) (Per Reginold Agent, NP pt meets criteria for the observation unit)  On Site Evaluation by:   Reviewed with Physician:    Waldon Merl [facility] 01/06/2015 1:11 PM

## 2015-01-06 NOTE — BH Assessment (Signed)
[place] Assessment Progress Note  Patient was seen on admission by Lillia Mountain with patient stating he is interested in possibly outpatient treatment upon discharge. Patient continues to exhibit symptoms associated with opiate withdrawals and will be re-evaluated in the a.m. Counselor will continue to process with patient resources available for aftercare as patient continues his stay in the Observational Unit.

## 2015-01-06 NOTE — ED Notes (Signed)
He states he is attempting to cease taking Klonipin and Percocet "by myself, but it's too hard--I feel dehydrated".  He also mentions passive thoughts of "hurting myself" without plan.

## 2015-01-06 NOTE — BH Assessment (Signed)
Patient accepted to [facility] observation unit. Patient assigned to obs #3. Nursing report [phone]. Support paperwork completed. Patient to be transported to [facility] via [facility].

## 2015-01-07 DIAGNOSIS — F1110 Opioid abuse, uncomplicated: Secondary | ICD-10-CM

## 2015-01-07 DIAGNOSIS — F1193 Opioid use, unspecified with withdrawal: Secondary | ICD-10-CM

## 2015-01-07 DIAGNOSIS — F1390 Sedative, hypnotic, or anxiolytic use, unspecified, uncomplicated: Secondary | ICD-10-CM | POA: Diagnosis not present

## 2015-01-07 MED ORDER — LORAZEPAM 1 MG PO TABS
1.0000 mg | ORAL_TABLET | Freq: Four times a day (QID) | ORAL | Status: AC
  Administered 2015-01-07 (×4): 1 mg via ORAL
  Filled 2015-01-07 (×4): qty 1

## 2015-01-07 MED ORDER — LORAZEPAM 1 MG PO TABS: 1.0000 mg | ORAL_TABLET | Freq: Every day | ORAL | Status: DC

## 2015-01-07 MED ORDER — CLONIDINE HCL 0.1 MG PO TABS: 0.1000 mg | ORAL_TABLET | Freq: Four times a day (QID) | ORAL | Status: DC | PRN

## 2015-01-07 MED ORDER — VITAMIN B-1 100 MG PO TABS
100.0000 mg | ORAL_TABLET | Freq: Every day | ORAL | Status: DC
  Administered 2015-01-07 – 2015-01-08 (×2): 100 mg via ORAL
  Filled 2015-01-07 (×2): qty 1

## 2015-01-07 MED ORDER — LORAZEPAM 1 MG PO TABS: 1.0000 mg | ORAL_TABLET | Freq: Two times a day (BID) | ORAL | Status: DC

## 2015-01-07 MED ORDER — LOPERAMIDE HCL 2 MG PO CAPS
2.0000 mg | ORAL_CAPSULE | ORAL | Status: DC | PRN
  Administered 2015-01-07 (×2): 2 mg via ORAL
  Administered 2015-01-08: 4 mg via ORAL
  Filled 2015-01-07: qty 1
  Filled 2015-01-07: qty 2
  Filled 2015-01-07: qty 1

## 2015-01-07 MED ORDER — LORAZEPAM 1 MG PO TABS
1.0000 mg | ORAL_TABLET | Freq: Three times a day (TID) | ORAL | Status: DC
  Administered 2015-01-08 (×2): 1 mg via ORAL
  Filled 2015-01-07 (×2): qty 1

## 2015-01-07 MED ORDER — TRAZODONE HCL 100 MG PO TABS
100.0000 mg | ORAL_TABLET | Freq: Every evening | ORAL | Status: DC | PRN
  Administered 2015-01-07: 100 mg via ORAL
  Filled 2015-01-07: qty 1

## 2015-01-07 MED ORDER — ONDANSETRON 4 MG PO TBDP: 4.0000 mg | ORAL_TABLET | Freq: Four times a day (QID) | ORAL | Status: DC | PRN

## 2015-01-07 MED ORDER — ACETAMINOPHEN 325 MG PO TABS
650.0000 mg | ORAL_TABLET | Freq: Four times a day (QID) | ORAL | Status: DC | PRN
  Administered 2015-01-07 – 2015-01-08 (×4): 650 mg via ORAL
  Filled 2015-01-07 (×3): qty 2

## 2015-01-07 MED ORDER — ACETAMINOPHEN 325 MG PO TABS
ORAL_TABLET | ORAL | Status: AC
  Filled 2015-01-07: qty 2

## 2015-01-07 MED ORDER — ADULT MULTIVITAMIN W/MINERALS CH
1.0000 | ORAL_TABLET | Freq: Every day | ORAL | Status: DC
  Administered 2015-01-07 – 2015-01-08 (×2): 1 via ORAL
  Filled 2015-01-07 (×2): qty 1

## 2015-01-07 MED ORDER — HYDROXYZINE HCL 25 MG PO TABS
25.0000 mg | ORAL_TABLET | Freq: Four times a day (QID) | ORAL | Status: DC | PRN
  Administered 2015-01-07 – 2015-01-08 (×3): 25 mg via ORAL
  Filled 2015-01-07 (×3): qty 1

## 2015-01-07 MED ORDER — METHOCARBAMOL 500 MG PO TABS
500.0000 mg | ORAL_TABLET | Freq: Four times a day (QID) | ORAL | Status: DC | PRN
  Administered 2015-01-07 – 2015-01-08 (×2): 500 mg via ORAL
  Filled 2015-01-07 (×2): qty 1

## 2015-01-07 NOTE — Progress Notes (Signed)
Patient ID: Brett Beck, male   DOB: Mar 18, 1975, 39 y.o.   MRN: [id number] D-Seen by NP today and since he continues to have sx of withdrawal, CIWA of 10 this afternoon, he will continue here in [place] and be reassessed in am. He has needed several prns today for his sx of withdrawal. He is pleasant and cooperative. He is not interested at this time in long term rehab. A-Support offered medications as ordered and monitored for safety. R-No complaints at this time. He has slept on and off this shift. He has had a good appetite. Able to get his needs met and pleasant with peers. Resting now.

## 2015-01-07 NOTE — Progress Notes (Signed)
Pt referred to [facility] as detox beds may be available today per Clair Gulling, intake (spoke with TTS counselor).  [facility] has not detox beds today- not accepting referrals.   Faxed pt's labs, demographics, and RN notes to [facility] for review.  Sharren Bridge, MSW, LCSW Clinical Social Work, Disposition  01/07/2015 [phone]

## 2015-01-07 NOTE — Progress Notes (Signed)
D: Pt alert and awake and watching football game on TV.  Anxious about when he could receive meds and wanting to know what was due now.  Stated he was [place]. A: Explained meds and when they were due.  Gave pt snacks and Gatoraide.  Meds given on time. R: Pt sleeping now. Continue to monitor for safety.

## 2015-01-07 NOTE — BHH Counselor (Signed)
Spoke with patient. Patient expressed that he is not necessarily interested in detox facility treatment at this time stating that prior to OBS he had been de-toxing for at least 3 days. Patient expressed that he has come off of his Clonopin and expressed concerns over this and having no insurance for medication management. Fraser Din also stated that he does not receive current outpatient treatment anywhere, but that he does attend AA/NA meetings regularly and that has been helping him. Patient expressed his direct preference for treatment was to continue meetings and to seek additional out-patinet treatment [expressed would like resources for [facility]] . This Probation officer will provide Technical sales engineer for [employer] for the patient. Joram Venson K. Joslyn Hy, [facility]  Counselor 01/08/2015 12:26 AM

## 2015-01-07 NOTE — Progress Notes (Signed)
Patient ID: Brett Beck, male   DOB: [birth year], 39 y.o.   MRN: [id number] D-Gave him am meds including Ativan. Complains of feeling like his heart rate is high and complains of shakiness. States he would like to stay here until he is safely detoxed, and then be discharged home. He lives and cares for his mother who has health problems. A-Support offered. Monitoredfor safety and medications. R-Waiting to speak with NP or dr today to plan for next step.

## 2015-01-07 NOTE — H&P (Signed)
[facility] H&P  Patient Identification: Brett Beck MRN: [id number] Principal Diagnosis: Moderate benzodiazepine use disorder Diagnosis:  Patient Active Problem List   Diagnosis Date Noted  . Opiate abuse, continuous [F11.10] 01/07/2015  . 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 MDD (major depressive disorder), recurrent episode, severe ([place]) [F33.2] 01/06/2015  . 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 COPD (chronic obstructive pulmonary disease) with emphysema ([place]) [J43.9] 11/02/2012  . Atypical chest pain [R07.89] 11/02/2012  . ANXIETY [F41.1] 08/01/2009  . DEPRESSION [F32.9] 08/01/2009  . ALLERGIC RHINITIS [J30.9] 08/01/2009  . PAIN IN JOINT, MULTIPLE SITES [M25.50] 08/01/2009  . BACK PAIN, LUMBAR, CHRONIC [M54.5] 08/01/2009  . HEADACHE [R51] 08/01/2009  . DIARRHEA, CHRONIC [R19.7] 08/01/2009    Total Time spent with patient: 45 minutes  Subjective:  Brett Beck is a 39 y.o. male patient admitted with suicidal thoughts due to withdrawal symptoms from running out of his Klonopin. He reports withdrawal symptoms from oxycodone to include sweats, shakes, and diarrhea. His most recent COW score was seven and CIWA was ten. Patient seen and chart reviewed by writer today on 01/07/2015. Patient is interested in being started on non-habit forming medications after getting detoxified from [place] stating "This happened to me last year when I ran out. It makes you feel very bad and is probably why I experienced some suicidal thoughts. I would also rather just be in pain than take oxycodone. These substances are hurting my life."   HPI:   I have reviewed and concur with the initial assessment as follows:  Brett Beck is an 39 y.o. male. Pt arrived to [facility] voluntarily. Pt reports suicidal thoughts for the past few days. Patient denies plan or intent. He has no history of prior suicide attempts. Pt  denies HI. Pt denies hallucinations except when withdrawing from substances. Pt states that he uses Klonopin and Oxycodone and would like detox. Pt states that he was prescribed Oxycodone for his back pain and Klonopin for anxiety. Pt sts that she takes her Klonopin as prescribed 50m's per day but only takes 1-276ms. Patient has taken Klonopin for the past 18 years and last use was 4 days ago. She denies abusing her Klonopin. Pt reports that he has been using Oxycodone for 7 years. He takes (4) 1013mxycodone tablets. Pt reports withdrawal symptoms from Oxycodone and Klonopin. According to the Pt, he sweats, shakes, has dirreahea, loses his appetite, and has insominia. Patient has not slept well in sever days.. Pt denies any outpatient or inpatient treatment. Pt reports depressive symptoms. According to the Pt, he isolates himself, cries daily, and has loss interest in activities. Patient has received inpatient treatment at [facility] the past (03/2014).  Past Psychiatric History: MDD  Risk to Self: Is patient at risk for suicide?: Yes Risk to Others:   Prior Inpatient Therapy:   Prior Outpatient Therapy:    Past Medical History:  Past Medical History  Diagnosis Date  . Degenerative disc disease   . Chronic back pain   . Pneumothorax on left   . Emphysema/COPD ([facility]   Past Surgical History  Procedure Laterality Date  . Lung surgery      Dr BurArlyce Diceleft lung  . Lung inflation      left lung  . Nose surgery      broken nose   Family History:  Family History  Problem Relation Age of Onset  . Alpha-1 antitrypsin deficiency Mother   . Emphysema  Mother   . Asthma Mother    Social History:  History  Alcohol Use No    History  Drug Use No    Social History   Social History  . Marital Status: Single    Spouse Name: N/A  . Number of Children: N/A  . Years of Education: N/A    Occupational History  . N/A    Social History Main Topics  . Smoking status: Current Every Day Smoker -- 0.50 packs/day for 12 years    Types: Cigarettes  . Smokeless tobacco: None  . Alcohol Use: No  . Drug Use: No  . Sexual Activity: Not Asked   Other Topics Concern  . None   Social History Narrative   Additional Social History:                         Allergies:  Allergies  Allergen Reactions  . Effexor [Venlafaxine] Diarrhea  . Hydrocodone-Acetaminophen     REACTION: severe headaches  . Ibuprofen Other (See Comments)    Severe stomach issues  . Lyrica [Pregabalin] Diarrhea    Swelling in the feet  . Neurontin [Gabapentin] Other (See Comments)    Severe stomach issues  . Nsaids Other (See Comments)    Severe stomach issues  . Tramadol Hcl     REACTION: diarrhea, abdominal pain    Labs:   Lab Results Last 48 Hours    Results for orders placed or performed during the hospital encounter of 01/06/15 (from the past 48 hour(s))  Ethanol Status: None   Collection Time: 01/06/15 10:05 AM  Result Value Ref Range   Alcohol, Ethyl (B) <5 <5 mg/dL    Comment:   LOWEST DETECTABLE LIMIT FOR SERUM ALCOHOL IS 5 mg/dL FOR MEDICAL PURPOSES ONLY   Urine rapid drug screen (hosp performed) Status: Abnormal   Collection Time: 01/06/15 10:05 AM  Result Value Ref Range   Opiates POSITIVE (A) NONE DETECTED   Cocaine NONE DETECTED NONE DETECTED   Benzodiazepines POSITIVE (A) NONE DETECTED   Amphetamines NONE DETECTED NONE DETECTED   Tetrahydrocannabinol POSITIVE (A) NONE DETECTED   Barbiturates NONE DETECTED NONE DETECTED    Comment:   DRUG SCREEN FOR MEDICAL PURPOSES ONLY. IF CONFIRMATION IS NEEDED FOR ANY PURPOSE, NOTIFY LAB WITHIN 5 DAYS.   LOWEST DETECTABLE LIMITS FOR URINE DRUG SCREEN Drug  Class Cutoff (ng/mL) Amphetamine 1000 Barbiturate 200 Benzodiazepine 244 Tricyclics 010 Opiates 272 Cocaine 300 THC 50   CBC with Differential/Platelet Status: Abnormal   Collection Time: 01/06/15 10:05 AM  Result Value Ref Range   WBC 10.1 4.0 - 10.5 K/uL   RBC 4.78 4.22 - 5.81 MIL/uL   Hemoglobin 15.9 13.0 - 17.0 g/dL   HCT 46.3 39.0 - 52.0 %   MCV 96.9 78.0 - 100.0 fL   MCH 33.3 26.0 - 34.0 pg   MCHC 34.3 30.0 - 36.0 g/dL   RDW 13.9 11.5 - 15.5 %   Platelets 338 150 - 400 K/uL   Neutrophils Relative % 78 %   Neutro Abs 7.9 (H) 1.7 - 7.7 K/uL   Lymphocytes Relative 15 %   Lymphs Abs 1.5 0.7 - 4.0 K/uL   Monocytes Relative 5 %   Monocytes Absolute 0.6 0.1 - 1.0 K/uL   Eosinophils Relative 1 %   Eosinophils Absolute 0.1 0.0 - 0.7 K/uL   Basophils Relative 1 %   Basophils Absolute 0.1 0.0 - 0.1 K/uL  Comprehensive metabolic panel Status: Abnormal  Collection Time: 01/06/15 10:05 AM  Result Value Ref Range   Sodium 138 135 - 145 mmol/L   Potassium 3.8 3.5 - 5.1 mmol/L   Chloride 107 101 - 111 mmol/L   CO2 23 22 - 32 mmol/L   Glucose, Bld 100 (H) 65 - 99 mg/dL   BUN 10 6 - 20 mg/dL   Creatinine, Ser 0.99 0.61 - 1.24 mg/dL   Calcium 9.2 8.9 - 10.3 mg/dL   Total Protein 7.0 6.5 - 8.1 g/dL   Albumin 4.3 3.5 - 5.0 g/dL   AST 15 15 - 41 U/L   ALT 13 (L) 17 - 63 U/L   Alkaline Phosphatase 97 38 - 126 U/L   Total Bilirubin 0.8 0.3 - 1.2 mg/dL   GFR calc non Af Amer >60 >60 mL/min   GFR calc Af Amer >60 >60 mL/min    Comment: (NOTE) The eGFR has been calculated using the CKD EPI equation. This calculation has not been validated in all clinical situations. eGFR's persistently <60 mL/min signify possible Chronic Kidney Disease.    Anion gap 8 5 - 15   Lipase, blood Status: None   Collection Time: 01/06/15 10:05 AM  Result Value Ref Range   Lipase 24 11 - 51 U/L      Current Facility-Administered Medications  Medication Dose Route Frequency Provider Last Rate Last Dose  . acetaminophen (TYLENOL) tablet 650 mg 650 mg Oral Q6H PRN Niel Hummer, NP  650 mg at 01/07/15 1715  . alum & mag hydroxide-simeth (MAALOX/MYLANTA) 200-200-20 MG/5ML suspension 30 mL 30 mL Oral Q4H PRN Derrill Center, NP    . cloNIDine (CATAPRES) tablet 0.1 mg 0.1 mg Oral Q6H PRN Niel Hummer, NP    . hydrOXYzine (ATARAX/VISTARIL) tablet 25 mg 25 mg Oral Q6H PRN Hampton Abbot, MD  25 mg at 01/07/15 1253  . loperamide (IMODIUM) capsule 2-4 mg 2-4 mg Oral PRN Hampton Abbot, MD  2 mg at 01/07/15 1355  . LORazepam (ATIVAN) tablet 1 mg 1 mg Oral QID Hampton Abbot, MD  1 mg at 01/07/15 1617   Followed by  . [START ON 01/08/2015] LORazepam (ATIVAN) tablet 1 mg 1 mg Oral TID Hampton Abbot, MD     Followed by  . [START ON 01/09/2015] LORazepam (ATIVAN) tablet 1 mg 1 mg Oral BID Hampton Abbot, MD     Followed by  . [START ON 01/10/2015] LORazepam (ATIVAN) tablet 1 mg 1 mg Oral Daily Hampton Abbot, MD    . magnesium hydroxide (MILK OF MAGNESIA) suspension 30 mL 30 mL Oral Daily PRN Derrill Center, NP    . methocarbamol (ROBAXIN) tablet 500 mg 500 mg Oral Q6H PRN Niel Hummer, NP    . multivitamin with minerals tablet 1 tablet 1 tablet Oral Daily Hampton Abbot, MD  1 tablet at 01/07/15 0819  . nicotine (NICODERM CQ - dosed in mg/24 hours) patch 21 mg 21 mg Transdermal Daily Delfin Gant, NP  21 mg at 01/07/15 0820  . ondansetron (ZOFRAN-ODT) disintegrating tablet 4 mg 4 mg Oral Q6H PRN Hampton Abbot, MD    . thiamine (VITAMIN B-1) tablet 100 mg 100 mg Oral Daily Hampton Abbot, MD  100 mg  at 01/07/15 0819  . traZODone (DESYREL) tablet 100 mg 100 mg Oral QHS PRN Niel Hummer, NP      Musculoskeletal: Strength & Muscle Tone: within normal limits Gait & Station: normal Patient leans: N/A  Psychiatric Specialty Exam: Review of Systems  Constitutional:  Positive for chills, malaise/fatigue and diaphoresis.  HENT: Negative.  Eyes: Negative.  Respiratory: Negative.  Cardiovascular: Negative.  Gastrointestinal: Positive for nausea and diarrhea.  Skin: Negative.  Neurological: Positive for tremors.  Endo/Heme/Allergies: Negative.  Psychiatric/Behavioral: Positive for depression and substance abuse. Negative for suicidal ideas, hallucinations and memory loss. The patient is nervous/anxious. The patient does not have insomnia.    Blood pressure 111/86, pulse 112, temperature 98.5 F (36.9 C), temperature source Oral, resp. rate 18, height _0  (1.854 m), weight 63.504 kg (140 lb), SpO2 96 %.Body mass index is 18.47 kg/(m^2).  General Appearance: Disheveled  Eye Contact:: Good  Speech: Clear and Coherent  Volume: Normal  Mood: Anxious  Affect: Depressed  Thought Process: Goal Directed and Intact  Orientation: Full (Time, Place, and Person)  Thought Content: Rumination  Suicidal Thoughts: No  Homicidal Thoughts: No  Memory: Immediate; Good Recent; Good Remote; Good  Judgement: Fair  Insight: Present  Psychomotor Activity: Tremor  Concentration: Good  Recall: Good  Fund of Knowledge:Good  Language: Good  Akathisia: No  Handed: Right  AIMS (if indicated):    Assets: Communication Skills Desire for Improvement Physical Health Resilience Social Support  ADL's: Intact  Cognition: WNL  Sleep:     Treatment Plan Summary: Daily contact with patient to assess and evaluate symptoms and progress in treatment and Medication management  Disposition: Admit to the [facility]Observation unit to safety  detox from Klonopin. Will refer for substance abuse services to address opiate abuse. Current referrals include to [facility] for review.  -Continue Ativan taper to prevent complications from long term Klonopin use -Start Clonidine 0.1 mg every six hours as needed for symptoms  Britteney Ayotte, NP-C 01/07/2015 5:40 PM

## 2015-01-07 NOTE — Progress Notes (Signed)
Patient ID: Brett Beck, male   DOB: 21-Apr-1975, 39 y.o.   MRN: [id number] Approached writer to complain of withdraw side affects. Hands are visibly tremulous, complains of stomach upset, and diarrhea which he hadnt thought to report to writer sooner. Complains of anxiety and mild body aches. Gave him prn Vistaril as ordered for his complaints.

## 2015-01-08 DIAGNOSIS — F1390 Sedative, hypnotic, or anxiolytic use, unspecified, uncomplicated: Secondary | ICD-10-CM | POA: Diagnosis not present

## 2015-01-08 DIAGNOSIS — F1110 Opioid abuse, uncomplicated: Secondary | ICD-10-CM | POA: Diagnosis not present

## 2015-01-08 MED ORDER — HYDROXYZINE HCL 25 MG PO TABS: 25.0000 mg | ORAL_TABLET | Freq: Four times a day (QID) | ORAL | Status: DC | PRN

## 2015-01-08 MED ORDER — TRAZODONE HCL 100 MG PO TABS: 100.0000 mg | ORAL_TABLET | Freq: Every evening | ORAL | Status: DC | PRN

## 2015-01-08 MED ORDER — ADULT MULTIVITAMIN W/MINERALS CH: 1.0000 | ORAL_TABLET | Freq: Every day | ORAL | Status: DC

## 2015-01-08 NOTE — Discharge Instructions (Signed)
Patient will be referred Alcohol and Drug services for SAIOP and possible medication management. Referral information and resources will provided on discharge. Patient and counselor started a relapse prevention plan and discussed coping mechanisms to enable the individual to maintain a successful recovery program. Referral information and resources will provided on discharge. Patient and counselor started a relapse prevention plan and discussed coping mechanisms to enable the individual to maintain a successful recovery program.

## 2015-01-08 NOTE — BH Assessment (Addendum)
[place] Assessment Progress Note  Patient will be referred Alcohol and Drug services for SAIOP and possible medication management. Referral information and resources will provided on discharge. Patient and counselor started a relapse prevention plan and discussed coping mechanisms to enable the individual to maintain his recovery efforts.

## 2015-01-08 NOTE — Progress Notes (Signed)
D: 0600 Patient says he slept very well but has some body aches. A:  PRN Tylenol 650 mg given. R: Sleeping

## 2015-01-08 NOTE — Progress Notes (Signed)
Patient Discharge Nursing Note:  Patient very cooperative, polite, pleasant. Is compliant with all medication orders, is on CWAI protocol; does c/o chronic back pain. Pt denies any SI/HI/AVH but does admit to persistent depression and desire to stop taking drugs and wants a [employer] of some sort. Q15 minute checks for safety continuous; Therapeutic communication and emotional support provided. Patient verbally contracts for safety. Patient's belongings returned and signed for and Discharge Summary packet signed for the chart and a copy given to patient as well as prescriptions. Patient escorted to Lobby by staff member for Discharge from Unit.

## 2015-01-08 NOTE — Discharge Summary (Signed)
[facility] Discharge Summary Note  Patient:  Brett Beck is an 39 y.o., male MRN:  [id number] DOB:  [birth year] Patient phone:  [phone] (home)  Patient address:   [address] [place] [address],  Total Time spent with patient: 30 minutes  Date of Admission:  01/06/2015 Date of Discharge: 01/08/2015  Reason for Admission:  Opiate/Benzo Detox  Principal Problem: Moderate benzodiazepine use disorder Discharge Diagnoses: Patient Active Problem List   Diagnosis Date Noted  . Opiate abuse, continuous [F11.10] 01/07/2015  . 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 MDD (major depressive disorder), recurrent episode, severe ([place]) [F33.2] 01/06/2015  . Bullous emphysema ([place]) [J43.9] 11/30/2012  . COPD (chronic obstructive pulmonary disease) with emphysema ([place]) [J43.9] 11/02/2012  . Atypical chest pain [R07.89] 11/02/2012  . ANXIETY [F41.1] 08/01/2009  . DEPRESSION [F32.9] 08/01/2009  . ALLERGIC RHINITIS [J30.9] 08/01/2009  . PAIN IN JOINT, MULTIPLE SITES [M25.50] 08/01/2009  . BACK PAIN, LUMBAR, CHRONIC [M54.5] 08/01/2009  . HEADACHE [R51] 08/01/2009  . DIARRHEA, CHRONIC [R19.7] 08/01/2009    Musculoskeletal: Strength & Muscle Tone: within normal limits Gait & Station: normal Patient leans: N/A  Psychiatric Specialty Exam: Physical Exam  Review of Systems  Constitutional: Negative.   HENT: Negative.   Eyes: Negative.   Respiratory: Negative.   Cardiovascular: Negative.   Gastrointestinal: Negative.   Genitourinary: Negative.   Musculoskeletal: Negative.   Skin: Negative.   Neurological: Negative.   Endo/Heme/Allergies: Negative.   Psychiatric/Behavioral: Negative for depression, suicidal ideas, hallucinations, memory loss and substance abuse. The patient is nervous/anxious (Stable ). The patient does not have insomnia.     Blood pressure 111/63, pulse 93, temperature 98.4 F (36.9 C), temperature source Oral, resp. rate 18,  height _0  (1.854 m), weight 63.504 kg (140 lb), SpO2 99 %.Body mass index is 18.47 kg/(m^2).  General Appearance: Casual  Eye Contact::  Good  Speech:  Clear and Coherent  Volume:  Normal  Mood:  Anxious  Affect:  Appropriate  Thought Process:  Goal Directed and Intact  Orientation:  Full (Time, Place, and Person)  Thought Content:  Symptoms, worries, concerns  Suicidal Thoughts:  No  Homicidal Thoughts:  No  Memory:  Immediate;   Good Recent;   Good Remote;   Good  Judgement:  Fair  Insight:  Present  Psychomotor Activity:  Normal  Concentration:  Good  Recall:  Good  Fund of Knowledge:Good  Language: Good  Akathisia:  No  Handed:  Right  AIMS (if indicated):     Assets:  Communication Skills Desire for Improvement Housing Leisure Time Physical Health Resilience Social Support Talents/Skills  ADL's:  Intact  Cognition: WNL  Sleep:      Have you used any form of tobacco in the last 30 days? (Cigarettes, Smokeless Tobacco, Cigars, and/or Pipes): Yes  Has this patient used any form of tobacco in the last 30 days? (Cigarettes, Smokeless Tobacco, Cigars, and/or Pipes) No  Past Medical History:  Past Medical History  Diagnosis Date  . Degenerative disc disease   . Chronic back pain   . Pneumothorax on left   . Emphysema/COPD [facility])     Past Surgical History  Procedure Laterality Date  . Lung surgery      Dr Arlyce Dice-- left lung  . Lung inflation      left lung  . Nose surgery      broken nose   Family History:  Family History  Problem Relation Age of Onset  . Alpha-1  antitrypsin deficiency Mother   . Emphysema Mother   . Asthma Mother    Social History:  History  Alcohol Use No     History  Drug Use No    Social History   Social History  . Marital Status: Single    Spouse Name: N/A  . Number of Children: N/A  . Years of Education: N/A   Occupational History  . N/A    Social History Main Topics  . Smoking status: Current Every Day Smoker --  0.50 packs/day for 12 years    Types: Cigarettes  . Smokeless tobacco: None  . Alcohol Use: No  . Drug Use: No  . Sexual Activity: Not Asked   Other Topics Concern  . None   Social History Narrative    Risk to Self: Is patient at risk for suicide?: Yes Risk to Others:   Prior Inpatient Therapy:   Prior Outpatient Therapy:    Level of Care:  OP  Hospital Course:    Brett Beck is a 39 y.o. male patient admitted with suicidal thoughts due to withdrawal symptoms from running out of his Klonopin. He reports withdrawal symptoms from oxycodone to include sweats, shakes, and diarrhea. His most recent COW score was seven and CIWA was ten. Patient seen and chart reviewed by writer today on 01/07/2015. Patient is interested in being started on non-habit forming medications after getting detoxified from [place] stating "This happened to me last year when I ran out. It makes you feel very bad and is probably why I experienced some suicidal thoughts. I would also rather just be in pain than take oxycodone. These substances are hurting my life."   Patient was admitted to the [facility] unit where he received ativan taper to prevent complications from long term Klonopin use. He also was ordered clonidine on a prn basis for opiate withdrawal but did not require any doses of medication. Patient requested outpatient resources for a Psychiatric Provider to explore non habiting forms medications to treat his anxiety. He stated "I had no idea that Klonopin could cause dependence. I started taking it sixteen years ago. I am through with that medication. I slept really good with the increase in Trazodone. I am not feeling suicidal. I am actually feeling better than when I came in here. I am ready to move forward with outpatient treatment." His vitals signs were noted to be stable and the patient verbalized readiness for discharge. His thought processes were logical and coherent. Brett Beck appeared very  motivated to continue treatment in a less restrictive environment. Patient denies any active suicidal ideation, paranoia, or delusions. He reported having a safe environment with mother who does not abuse substances to return to after discharge. Patient left [place] in stable condition with all belongings returned to him. He also denied any continued withdrawal symptoms on day of discharge. Patient requested a prescription for vistaril and trazodone until he could be seen outpatient, which was provided to him at time of discharge. The patient was also provided with a list of Providers for psychiatric services with phone numbers prior to his discharge.   Consults:  None  Significant Diagnostic Studies:  Chemistry profile, CBC, UDS positive for benzo, opiates, marijuana   Discharge Vitals:   Blood pressure 111/63, pulse 93, temperature 98.4 F (36.9 C), temperature source Oral, resp. rate 18, height _0  (1.854 m), weight 63.504 kg (140 lb), SpO2 99 %. Body mass index is 18.47 kg/(m^2). Lab Results:   Results for orders  placed or performed during the hospital encounter of 01/06/15 (from the past 72 hour(s))  Ethanol     Status: None   Collection Time: 01/06/15 10:05 AM  Result Value Ref Range   Alcohol, Ethyl (B) <5 <5 mg/dL    Comment:        LOWEST DETECTABLE LIMIT FOR SERUM ALCOHOL IS 5 mg/dL FOR MEDICAL PURPOSES ONLY   Urine rapid drug screen (hosp performed)     Status: Abnormal   Collection Time: 01/06/15 10:05 AM  Result Value Ref Range   Opiates POSITIVE (A) NONE DETECTED   Cocaine NONE DETECTED NONE DETECTED   Benzodiazepines POSITIVE (A) NONE DETECTED   Amphetamines NONE DETECTED NONE DETECTED   Tetrahydrocannabinol POSITIVE (A) NONE DETECTED   Barbiturates NONE DETECTED NONE DETECTED    Comment:        DRUG SCREEN FOR MEDICAL PURPOSES ONLY.  IF CONFIRMATION IS NEEDED FOR ANY PURPOSE, NOTIFY LAB WITHIN 5 DAYS.        LOWEST DETECTABLE LIMITS FOR URINE DRUG SCREEN Drug Class        Cutoff (ng/mL) Amphetamine      1000 Barbiturate      200 Benzodiazepine   268 Tricyclics       341 Opiates          300 Cocaine          300 THC              50   CBC with Differential/Platelet     Status: Abnormal   Collection Time: 01/06/15 10:05 AM  Result Value Ref Range   WBC 10.1 4.0 - 10.5 K/uL   RBC 4.78 4.22 - 5.81 MIL/uL   Hemoglobin 15.9 13.0 - 17.0 g/dL   HCT 46.3 39.0 - 52.0 %   MCV 96.9 78.0 - 100.0 fL   MCH 33.3 26.0 - 34.0 pg   MCHC 34.3 30.0 - 36.0 g/dL   RDW 13.9 11.5 - 15.5 %   Platelets 338 150 - 400 K/uL   Neutrophils Relative % 78 %   Neutro Abs 7.9 (H) 1.7 - 7.7 K/uL   Lymphocytes Relative 15 %   Lymphs Abs 1.5 0.7 - 4.0 K/uL   Monocytes Relative 5 %   Monocytes Absolute 0.6 0.1 - 1.0 K/uL   Eosinophils Relative 1 %   Eosinophils Absolute 0.1 0.0 - 0.7 K/uL   Basophils Relative 1 %   Basophils Absolute 0.1 0.0 - 0.1 K/uL  Comprehensive metabolic panel     Status: Abnormal   Collection Time: 01/06/15 10:05 AM  Result Value Ref Range   Sodium 138 135 - 145 mmol/L   Potassium 3.8 3.5 - 5.1 mmol/L   Chloride 107 101 - 111 mmol/L   CO2 23 22 - 32 mmol/L   Glucose, Bld 100 (H) 65 - 99 mg/dL   BUN 10 6 - 20 mg/dL   Creatinine, Ser 0.99 0.61 - 1.24 mg/dL   Calcium 9.2 8.9 - 10.3 mg/dL   Total Protein 7.0 6.5 - 8.1 g/dL   Albumin 4.3 3.5 - 5.0 g/dL   AST 15 15 - 41 U/L   ALT 13 (L) 17 - 63 U/L   Alkaline Phosphatase 97 38 - 126 U/L   Total Bilirubin 0.8 0.3 - 1.2 mg/dL   GFR calc non Af Amer >60 >60 mL/min   GFR calc Af Amer >60 >60 mL/min    Comment: (NOTE) The eGFR has been calculated using the CKD EPI equation. This  calculation has not been validated in all clinical situations. eGFR's persistently <60 mL/min signify possible Chronic Kidney Disease.    Anion gap 8 5 - 15  Lipase, blood     Status: None   Collection Time: 01/06/15 10:05 AM  Result Value Ref Range   Lipase 24 11 - 51 U/L    Physical Findings: AIMS: Facial and Oral  Movements Muscles of Facial Expression: None, normal Lips and Perioral Area: None, normal Jaw: None, normal Tongue: None, normal,Extremity Movements Upper (arms, wrists, hands, fingers): None, normal Lower (legs, knees, ankles, toes): None, normal, Trunk Movements Neck, shoulders, hips: None, normal, Overall Severity Severity of abnormal movements (highest score from questions above): None, normal Incapacitation due to abnormal movements: None, normal Patient's awareness of abnormal movements (rate only patient's report): No Awareness, Dental Status Current problems with teeth and/or dentures?: No Does patient usually wear dentures?: No  CIWA:  CIWA-Ar Total: 5 COWS:  COWS Total Score: 8  Discharge destination:  Home  Is patient on multiple antipsychotic therapies at discharge:  No   Has Patient had three or more failed trials of antipsychotic monotherapy by history:  No   Recommended Plan for Multiple Antipsychotic Therapies: NA     Medication List    TAKE these medications      Indication   acetaminophen 500 MG tablet  Commonly known as:  TYLENOL  Take 1,000 mg by mouth every 6 (six) hours as needed for moderate pain.      budesonide-formoterol 80-4.5 MCG/ACT inhaler  Commonly known as:  SYMBICORT  Inhale 2 puffs into the lungs 2 (two) times daily as needed (shortness of breathe.).      hydrOXYzine 25 MG tablet  Commonly known as:  ATARAX/VISTARIL  Take 1 tablet (25 mg total) by mouth every 6 (six) hours as needed (anxiety/agitation or CIWA < or = 10).   Indication:  Anxiety Neurosis, Tension     multivitamin with minerals Tabs tablet  Take 1 tablet by mouth daily.   Indication:  Vitamin Supplementation     traZODone 100 MG tablet  Commonly known as:  DESYREL  Take 1 tablet (100 mg total) by mouth at bedtime as needed for sleep.   Indication:  Trouble Sleeping       Follow-up Information    Call [facility].   Why:  SAIOP   Contact information:    [place]      Follow-up recommendations:   As above   Comments:  Take all your medications as prescribed by your mental healthcare provider.  Report any adverse effects and or reactions from your medicines to your outpatient provider promptly.  Patient is instructed and cautioned to not engage in alcohol and or illegal drug use while on prescription medicines.  In the event of worsening symptoms, patient is instructed to call the crisis hotline, 911 and or go to the nearest ED for appropriate evaluation and treatment of symptoms.  Follow-up with your primary care provider for your other medical issues, concerns and or health care needs.   Total Discharge Time: Greater than 30 minutes  Signed: Elmarie Shiley, NP-C 01/08/2015, 2:30 PM

## 2015-01-08 NOTE — [facility] Counselor (Signed)
This Probation officer gave patient list of outpatient services for no insurance, within local area of [place] as requested. This Probation officer also provided printed resources and info for patient in chart to be given to pt. Upon d/c. Resources were for ADS services and outpatient services at [facility]. Inetha Maret K. Nash Shearer, LPC-A, [facility]  Counselor 01/08/2015 12:30 AM

## 2015-03-20 ENCOUNTER — Emergency Department (HOSPITAL_COMMUNITY)
Admission: EM | Admit: 2015-03-20 | Discharge: 2015-03-20 | Disposition: A | Discharge status: Other Acute Inpatient Hospital | Payer: 59 | Attending: Emergency Medicine | Admitting: Emergency Medicine

## 2015-03-20 ENCOUNTER — Encounter (HOSPITAL_COMMUNITY): Payer: Self-pay

## 2015-03-20 ENCOUNTER — Observation Stay (HOSPITAL_COMMUNITY)
Admission: AD | Admit: 2015-03-20 | Discharge: 2015-03-22 | Disposition: A | Discharge status: Home / Self Care | Payer: 59 | Source: Intra-hospital | Attending: Psychiatry | Admitting: Psychiatry

## 2015-03-20 ENCOUNTER — Encounter (HOSPITAL_COMMUNITY): Payer: Self-pay | Admitting: Emergency Medicine

## 2015-03-20 DIAGNOSIS — F17210 Nicotine dependence, cigarettes, uncomplicated: Secondary | ICD-10-CM | POA: Insufficient documentation

## 2015-03-20 DIAGNOSIS — Z888 Allergy status to other drugs, medicaments and biological substances status: Secondary | ICD-10-CM | POA: Insufficient documentation

## 2015-03-20 DIAGNOSIS — R45851 Suicidal ideations: Secondary | ICD-10-CM

## 2015-03-20 DIAGNOSIS — Z79899 Other long term (current) drug therapy: Secondary | ICD-10-CM | POA: Insufficient documentation

## 2015-03-20 DIAGNOSIS — F1193 Opioid use, unspecified with withdrawal: Secondary | ICD-10-CM | POA: Diagnosis present

## 2015-03-20 DIAGNOSIS — F1124 Opioid dependence with opioid-induced mood disorder: Secondary | ICD-10-CM | POA: Insufficient documentation

## 2015-03-20 DIAGNOSIS — J449 Chronic obstructive pulmonary disease, unspecified: Secondary | ICD-10-CM | POA: Insufficient documentation

## 2015-03-20 DIAGNOSIS — F1210 Cannabis abuse, uncomplicated: Secondary | ICD-10-CM | POA: Insufficient documentation

## 2015-03-20 DIAGNOSIS — J439 Emphysema, unspecified: Secondary | ICD-10-CM | POA: Insufficient documentation

## 2015-03-20 DIAGNOSIS — F332 Major depressive disorder, recurrent severe without psychotic features: Secondary | ICD-10-CM | POA: Insufficient documentation

## 2015-03-20 DIAGNOSIS — Z8739 Personal history of other diseases of the musculoskeletal system and connective tissue: Secondary | ICD-10-CM | POA: Insufficient documentation

## 2015-03-20 DIAGNOSIS — Z885 Allergy status to narcotic agent status: Secondary | ICD-10-CM | POA: Insufficient documentation

## 2015-03-20 DIAGNOSIS — Z886 Allergy status to analgesic agent status: Secondary | ICD-10-CM | POA: Insufficient documentation

## 2015-03-20 DIAGNOSIS — F1110 Opioid abuse, uncomplicated: Secondary | ICD-10-CM | POA: Insufficient documentation

## 2015-03-20 DIAGNOSIS — F1994 Other psychoactive substance use, unspecified with psychoactive substance-induced mood disorder: Secondary | ICD-10-CM | POA: Diagnosis present

## 2015-03-20 DIAGNOSIS — F1310 Sedative, hypnotic or anxiolytic abuse, uncomplicated: Secondary | ICD-10-CM | POA: Insufficient documentation

## 2015-03-20 DIAGNOSIS — M549 Dorsalgia, unspecified: Secondary | ICD-10-CM | POA: Insufficient documentation

## 2015-03-20 DIAGNOSIS — F99 Mental disorder, not otherwise specified: Secondary | ICD-10-CM | POA: Insufficient documentation

## 2015-03-20 DIAGNOSIS — F1123 Opioid dependence with withdrawal: Secondary | ICD-10-CM | POA: Diagnosis present

## 2015-03-20 DIAGNOSIS — F411 Generalized anxiety disorder: Secondary | ICD-10-CM | POA: Diagnosis present

## 2015-03-20 DIAGNOSIS — G8929 Other chronic pain: Secondary | ICD-10-CM | POA: Insufficient documentation

## 2015-03-20 LAB — RAPID URINE DRUG SCREEN, HOSP PERFORMED
Amphetamines: NOT DETECTED
BARBITURATES: NOT DETECTED
BENZODIAZEPINES: POSITIVE — AB
Cocaine: NOT DETECTED
Opiates: POSITIVE — AB
Tetrahydrocannabinol: POSITIVE — AB

## 2015-03-20 LAB — CBC
HEMATOCRIT: 48.2 % (ref 39.0–52.0)
Hemoglobin: 16.5 g/dL (ref 13.0–17.0)
MCH: 32.7 pg (ref 26.0–34.0)
MCHC: 34.2 g/dL (ref 30.0–36.0)
MCV: 95.4 fL (ref 78.0–100.0)
Platelets: 375 10*3/uL (ref 150–400)
RBC: 5.05 MIL/uL (ref 4.22–5.81)
RDW: 12.8 % (ref 11.5–15.5)
WBC: 12.1 10*3/uL — AB (ref 4.0–10.5)

## 2015-03-20 LAB — COMPREHENSIVE METABOLIC PANEL
ALT: 14 U/L — ABNORMAL LOW (ref 17–63)
ANION GAP: 9 (ref 5–15)
AST: 23 U/L (ref 15–41)
Albumin: 4.6 g/dL (ref 3.5–5.0)
Alkaline Phosphatase: 89 U/L (ref 38–126)
BILIRUBIN TOTAL: 0.6 mg/dL (ref 0.3–1.2)
BUN: 13 mg/dL (ref 6–20)
CALCIUM: 9.3 mg/dL (ref 8.9–10.3)
CO2: 26 mmol/L (ref 22–32)
Chloride: 101 mmol/L (ref 101–111)
Creatinine, Ser: 1.13 mg/dL (ref 0.61–1.24)
GFR calc Af Amer: >60 mL/min (ref >60)
Glucose, Bld: 110 mg/dL — ABNORMAL HIGH (ref 65–99)
POTASSIUM: 3.4 mmol/L — AB (ref 3.5–5.1)
Sodium: 136 mmol/L (ref 135–145)
TOTAL PROTEIN: 7.5 g/dL (ref 6.5–8.1)

## 2015-03-20 LAB — URINALYSIS, ROUTINE W REFLEX MICROSCOPIC
Bilirubin Urine: NEGATIVE
Glucose, UA: NEGATIVE mg/dL
Hgb urine dipstick: NEGATIVE
KETONES UR: NEGATIVE mg/dL
LEUKOCYTES UA: NEGATIVE
NITRITE: NEGATIVE
PH: 6 (ref 5.0–8.0)
PROTEIN: NEGATIVE mg/dL
Specific Gravity, Urine: 1.02 (ref 1.005–1.030)

## 2015-03-20 LAB — ACETAMINOPHEN LEVEL: ACETAMINOPHEN (TYLENOL), SERUM: 16 ug/mL (ref 10–30)

## 2015-03-20 LAB — SALICYLATE LEVEL

## 2015-03-20 LAB — ETHANOL

## 2015-03-20 LAB — LIPASE, BLOOD: Lipase: 26 U/L (ref 11–51)

## 2015-03-20 MED ORDER — DICYCLOMINE HCL 20 MG PO TABS
20.0000 mg | ORAL_TABLET | Freq: Four times a day (QID) | ORAL | Status: DC | PRN
  Administered 2015-03-21 (×2): 20 mg via ORAL
  Filled 2015-03-20 (×2): qty 1

## 2015-03-20 MED ORDER — HYDROXYZINE HCL 25 MG PO TABS
25.0000 mg | ORAL_TABLET | Freq: Four times a day (QID) | ORAL | Status: DC | PRN
  Administered 2015-03-20: 25 mg via ORAL
  Filled 2015-03-20: qty 1

## 2015-03-20 MED ORDER — NICOTINE 21 MG/24HR TD PT24
21.0000 mg | MEDICATED_PATCH | Freq: Every day | TRANSDERMAL | Status: DC
  Administered 2015-03-20 – 2015-03-22 (×3): 21 mg via TRANSDERMAL
  Filled 2015-03-20 (×3): qty 1

## 2015-03-20 MED ORDER — CLONIDINE HCL 0.1 MG PO TABS
0.1000 mg | ORAL_TABLET | Freq: Four times a day (QID) | ORAL | Status: DC
  Administered 2015-03-20: 0.1 mg via ORAL
  Filled 2015-03-20: qty 1

## 2015-03-20 MED ORDER — LOPERAMIDE HCL 2 MG PO CAPS: 2.0000 mg | ORAL_CAPSULE | ORAL | Status: DC | PRN

## 2015-03-20 MED ORDER — CLONIDINE HCL 0.1 MG PO TABS: 0.1000 mg | ORAL_TABLET | Freq: Every day | ORAL | Status: DC

## 2015-03-20 MED ORDER — METHOCARBAMOL 500 MG PO TABS
500.0000 mg | ORAL_TABLET | Freq: Three times a day (TID) | ORAL | Status: DC | PRN
  Administered 2015-03-21 (×2): 500 mg via ORAL
  Filled 2015-03-20 (×2): qty 1

## 2015-03-20 MED ORDER — ALUM & MAG HYDROXIDE-SIMETH 200-200-20 MG/5ML PO SUSP: 30.0000 mL | ORAL | Status: DC | PRN

## 2015-03-20 MED ORDER — ONDANSETRON 4 MG PO TBDP: 4.0000 mg | ORAL_TABLET | Freq: Four times a day (QID) | ORAL | Status: DC | PRN

## 2015-03-20 MED ORDER — HYDROXYZINE HCL 25 MG PO TABS
25.0000 mg | ORAL_TABLET | Freq: Four times a day (QID) | ORAL | Status: DC | PRN
  Administered 2015-03-21 (×2): 25 mg via ORAL
  Filled 2015-03-20 (×2): qty 1

## 2015-03-20 MED ORDER — DICYCLOMINE HCL 20 MG PO TABS
20.0000 mg | ORAL_TABLET | Freq: Four times a day (QID) | ORAL | Status: DC | PRN
  Administered 2015-03-20: 20 mg via ORAL
  Filled 2015-03-20: qty 1

## 2015-03-20 MED ORDER — QUETIAPINE FUMARATE 50 MG PO TABS
50.0000 mg | ORAL_TABLET | Freq: Every evening | ORAL | Status: DC | PRN
  Administered 2015-03-20 (×2): 50 mg via ORAL
  Filled 2015-03-20 (×2): qty 1

## 2015-03-20 MED ORDER — MAGNESIUM HYDROXIDE 400 MG/5ML PO SUSP: 30.0000 mL | Freq: Every day | ORAL | Status: DC | PRN

## 2015-03-20 MED ORDER — BUDESONIDE-FORMOTEROL FUMARATE 80-4.5 MCG/ACT IN AERO
2.0000 | INHALATION_SPRAY | Freq: Two times a day (BID) | RESPIRATORY_TRACT | Status: DC | PRN
  Filled 2015-03-20: qty 6.9

## 2015-03-20 MED ORDER — TRAZODONE HCL 100 MG PO TABS
100.0000 mg | ORAL_TABLET | Freq: Every evening | ORAL | Status: DC | PRN
  Administered 2015-03-21: 100 mg via ORAL
  Filled 2015-03-20: qty 1

## 2015-03-20 MED ORDER — GI COCKTAIL ~~LOC~~
30.0000 mL | Freq: Once | ORAL | Status: AC
  Administered 2015-03-20: 30 mL via ORAL
  Filled 2015-03-20: qty 30

## 2015-03-20 MED ORDER — ONDANSETRON HCL 4 MG/2ML IJ SOLN: 4.0000 mg | Freq: Once | INTRAMUSCULAR | Status: DC

## 2015-03-20 MED ORDER — ONDANSETRON 4 MG PO TBDP
4.0000 mg | ORAL_TABLET | Freq: Four times a day (QID) | ORAL | Status: DC | PRN
  Administered 2015-03-20: 4 mg via ORAL
  Filled 2015-03-20: qty 1

## 2015-03-20 MED ORDER — LOPERAMIDE HCL 2 MG PO CAPS
2.0000 mg | ORAL_CAPSULE | ORAL | Status: DC | PRN
  Administered 2015-03-21: 2 mg via ORAL
  Filled 2015-03-20: qty 1

## 2015-03-20 MED ORDER — METHOCARBAMOL 500 MG PO TABS
500.0000 mg | ORAL_TABLET | Freq: Three times a day (TID) | ORAL | Status: DC | PRN
  Administered 2015-03-20: 500 mg via ORAL
  Filled 2015-03-20: qty 1

## 2015-03-20 MED ORDER — CLONIDINE HCL 0.1 MG PO TABS: 0.1000 mg | ORAL_TABLET | ORAL | Status: DC

## 2015-03-20 MED ORDER — LOPERAMIDE HCL 2 MG PO CAPS
2.0000 mg | ORAL_CAPSULE | Freq: Once | ORAL | Status: AC
  Administered 2015-03-20: 2 mg via ORAL
  Filled 2015-03-20: qty 1

## 2015-03-20 NOTE — BH Assessment (Signed)
Assessment Note  Brett Beck is an 40 y.o. male. Pt arrived to [facility] voluntarily. Pt reports suicidal thoughts for the past few days. Patient reports having a suicidal plan. He was however unable to provide a specific plan stating, "I can't really think of something right now".  He has no history of prior suicide attempts.No history of self mutilating behaviors. Patient's depression has increased. Patient has not slept well in several days. Pt reports depressive symptoms. According to the Pt, he isolates himself, cries daily, and has loss interest in activities.  Pt denies HI. He is currently clam and cooperative. He does have legal charges "Simple Assault". Patient's court date is 03/22/2015. Pt denies current AVH's except when withdrawing from substances.   Pt states that he uses Klonopin and Oxycodone and would like detox. Pt states that he was prescribed Oxycodone for his back pain and Klonopin for anxiety. Pt sts that he takes his Klonopin as prescribed 4mg  per day but only takes 1-2mg 's. Patient has taken Klonopin for the past 18 years and last use was 2 days ago. He denies abusing her Klonopin. Pt reports that he has been using Oxycodone for 7 years. He takes (5-10) 10mg  Oxycodone tablets. Pt reports withdrawal symptoms from Oxycodone and Klonopin. According to the Pt, he sweats, shakes, has dirreahea, loses his appetite, and has insominia.   Pt denies having any outpatient psychiatric services. Patient has received inpatient treatment at [facility] in the past (03/2014). He also received treatment at [facility] 2-3 months ago in the OBS Unit.  Diagnosis: Depressive Disorder and Substance Abuse  Past Medical History:  Past Medical History  Diagnosis Date  . Degenerative disc disease   . Chronic back pain   . Pneumothorax on left   . Emphysema/COPD [facility])     Past Surgical History  Procedure Laterality Date  . Lung surgery      Dr Arlyce Dice-- left lung  . Lung inflation      left lung  .  Nose surgery      broken nose    Family History:  Family History  Problem Relation Age of Onset  . Alpha-1 antitrypsin deficiency Mother   . Emphysema Mother   . Asthma Mother     Social History:  reports that he has been smoking Cigarettes.  He has a 6 pack-year smoking history. He does not have any smokeless tobacco history on file. He reports that he does not drink alcohol or use illicit drugs.  Additional Social History:  Alcohol / Drug Use Pain Medications: SEE MAR Prescriptions: SEE MAR Over the Counter: SEE MAR History of alcohol / drug use?: Yes Withdrawal Symptoms: Nausea / Vomiting, Diarrhea, Agitation, Fever / Chills Substance #1 Name of Substance 1: Oxycodone 1 - Age of First Use: 40 yrs old  1 - Amount (size/oz): "Depends on the day 50mg 's-90mg 's" 1 - Frequency: daily  1 - Duration: 6 yrs ("5 of those yrs they were legally prescribed" 1 - Last Use / Amount: "2 nights ago" Substance #2 Name of Substance 2: Klonopin "I don't abuse..I take for my anxiety and also I take to help me steep" 2 - Age of First Use: 20's 2 - Amount (size/oz): 1mg  2 - Frequency: daily  2 - Duration: 16 yrs  2 - Last Use / Amount: 03/19/2015  CIWA: CIWA-Ar BP: 113/86 mmHg Pulse Rate: 93 COWS:    Allergies:  Allergies  Allergen Reactions  . Effexor [Venlafaxine] Diarrhea  . Hydrocodone-Acetaminophen  REACTION: severe headaches  . Ibuprofen Other (See Comments)    Severe stomach issues  . Lyrica [Pregabalin] Diarrhea    Swelling in the feet  . Neurontin [Gabapentin] Other (See Comments)    Severe stomach issues  . Nsaids Other (See Comments)    Severe stomach issues  . Tramadol Hcl     REACTION: diarrhea, abdominal pain    Home Medications:  (Not in a hospital admission)  OB/GYN Status:  No LMP for male patient.  General Assessment Data Location of Assessment: [facility] ED TTS Assessment: In system Is this a Tele or Face-to-Face Assessment?: Face-to-Face Is this an Initial  Assessment or a Re-assessment for this encounter?: Initial Assessment Marital status: Single Maiden name:  (n/a) Is patient pregnant?: No Pregnancy Status: No Living Arrangements: Other (Comment), Parent (caregiver of sick parent (18 yrs)) Can pt return to current living arrangement?: Yes Admission Status: Voluntary Is patient capable of signing voluntary admission?: Yes Referral Source: Self/Family/Friend Insurance type:  (Self Pay "My insurance goes into effect 04/2015")  Medical Screening Exam ([place]) Medical Exam completed: No  Crisis Care Plan Living Arrangements: Other (Comment), Parent (caregiver of sick parent (18 yrs)) Legal Guardian:  (patient denies ) Name of Psychiatrist:  (No psychiatrist) Name of Therapist:  (No therapist)  Education Status Is patient currently in school?: No Current Grade:  (n/a) Highest grade of school patient has completed:  (couple years of college) Name of school:  (n/a) Contact person:  (n/a)  Risk to self with the past 6 months Suicidal Ideation: Yes-Currently Present Has patient been a risk to self within the past 6 months prior to admission? : Yes Suicidal Intent: Yes-Currently Present Has patient had any suicidal intent within the past 6 months prior to admission? : Yes Is patient at risk for suicide?: Yes Suicidal Plan?: Yes-Currently Present Has patient had any suicidal plan within the past 6 months prior to admission? : Other (comment) Specify Current Suicidal Plan:  ("General Ideas..I don't want to go into detail") Access to Means: No What has been your use of drugs/alcohol within the last 12 months?:  (patient denies alcohol; reports oxycodone use; RX Klonopin) Previous Attempts/Gestures: No How many times?:  (0) Other Self Harm Risks:  (patient denies ) Triggers for Past Attempts:  (no previous attempts or gestures) Intentional Self Injurious Behavior: None Family Suicide History: Yes (Uncle-Suicide attempt in 20;  Ma Gmother-Schizophrenia ) Recent stressful life event(s): Other (Comment), Financial Problems ("Not having insurance to obtain meds"; Moms bills) Persecutory voices/beliefs?: No Depression: Yes Depression Symptoms: Feeling angry/irritable, Feeling worthless/self pity, Loss of interest in usual pleasures, Guilt, Fatigue, Isolating, Tearfulness, Insomnia, Despondent Substance abuse history and/or treatment for substance abuse?: No Suicide prevention information given to non-admitted patients: Not applicable  Risk to Others within the past 6 months Homicidal Ideation: No Does patient have any lifetime risk of violence toward others beyond the six months prior to admission? : No Thoughts of Harm to Others: No Current Homicidal Intent: No Current Homicidal Plan: No Access to Homicidal Means: No Identified Victim:  (none reported) History of harm to others?: No Assessment of Violence: None Noted Violent Behavior Description:  (patient calm and cooperative ) Does patient have access to weapons?: No Criminal Charges Pending?: No (03/22/2015) Does patient have a court date: Yes Court Date:  (03/22/2015 Simple Assault) Is patient on probation?: No  Psychosis Hallucinations: None noted ("I did 2 yrs again when I tried to go off Klonopin") Delusions:  (none reported)  Mental  Status Report Appearance/Hygiene: In scrubs Eye Contact: Good Motor Activity: Freedom of movement Speech: Logical/coherent Level of Consciousness: Alert Mood: Depressed Affect: Appropriate to circumstance Anxiety Level: Panic Attacks Panic attack frequency:  ("!x per week for the past several month") Most recent panic attack:  (today ) Thought Processes: Coherent Judgement: Partial Orientation: Person, Time, Situation, Place Obsessive Compulsive Thoughts/Behaviors: Minimal  Cognitive Functioning Concentration: Decreased Memory: Recent Intact, Remote Intact IQ: Average Insight: Fair Impulse Control:  Fair Appetite: Poor Weight Loss:  ("Maybe.Marland KitchenMarland KitchenI don't keep up with my weight") Weight Gain:  (unk) Sleep: Decreased Total Hours of Sleep:  ("2 hrs per night if I am lucky") Vegetative Symptoms: None  ADLScreening [facility] Assessment Services) Patient's cognitive ability adequate to safely complete daily activities?: Yes Patient able to express need for assistance with ADLs?: Yes Independently performs ADLs?: Yes (appropriate for developmental age)  Prior Inpatient Therapy Prior Inpatient Therapy: Yes Prior Therapy Dates:  (2-3 months ago) Prior Therapy Facilty/Provider(s):  (Observation Unit) Reason for Treatment:  (substance abuse, depression ,SI)  Prior Outpatient Therapy Prior Outpatient Therapy: No Prior Therapy Dates:  (n/a) Prior Therapy Facilty/Provider(s):  (n/a) Reason for Treatment:  (n/a) Does patient have an ACCT team?: No Does patient have Intensive In-House Services?  : No Does patient have [facility] services? : No Does patient have P4CC services?: No  ADL Screening (condition at time of admission) Patient's cognitive ability adequate to safely complete daily activities?: Yes Is the patient deaf or have difficulty hearing?: No Does the patient have difficulty seeing, even when wearing glasses/contacts?: No Does the patient have difficulty concentrating, remembering, or making decisions?: No Patient able to express need for assistance with ADLs?: Yes Does the patient have difficulty dressing or bathing?: No Independently performs ADLs?: Yes (appropriate for developmental age) Does the patient have difficulty walking or climbing stairs?: No Weakness of Legs: None Weakness of Arms/Hands: None  Home Assistive Devices/Equipment Home Assistive Devices/Equipment: None    Abuse/Neglect Assessment (Assessment to be complete while patient is alone) Physical Abuse: Denies Verbal Abuse: Denies Sexual Abuse: Denies Exploitation of patient/patient's resources:  Denies Self-Neglect: Denies Values / Beliefs Cultural Requests During Hospitalization: None Spiritual Requests During Hospitalization: None   Advance Directives (For Healthcare) Does patient have an advance directive?: No    Additional Information 1:1 In Past 12 Months?: No CIRT Risk: No Elopement Risk: No Does patient have medical clearance?: Yes     Disposition:  Disposition Initial Assessment Completed for this Encounter: Yes  On Site Evaluation by:   Reviewed with Physician:    Evangeline Gula 03/20/2015 3:58 PM

## 2015-03-20 NOTE — ED Provider Notes (Signed)
CSN: [id number]     Arrival date & time 03/20/15  1358 History   First MD Initiated Contact with Patient 03/20/15 1506     Chief Complaint  Patient presents with  . Abdominal Pain  . Suicidal  . Medical Clearance     (Consider location/radiation/quality/duration/timing/severity/associated sxs/prior Treatment) Patient is a 40 y.o. male presenting with mental health disorder. The history is provided by the patient.  Mental Health Problem Presenting symptoms: suicidal thoughts and suicidal threats   Presenting symptoms: no suicide attempt   Degree of incapacity (severity):  Moderate Onset quality:  Gradual Duration:  6 months Timing:  Constant Progression:  Worsening Chronicity:  Chronic Context: drug abuse and stressful life event (loss of job)   Context: not alcohol use, not noncompliant and not recent medication change   Treatment compliance:  Untreated Time since last psychoactive medication taken:  2 months Relieved by:  Nothing Worsened by:  Nothing tried Ineffective treatments:  None tried Associated symptoms: no abdominal pain, no chest pain and no headaches     40 yo M with a chief complaint of suicidal ideation. Patient states that he's had this going on for about the past 6 months or since he lost his job. Feels like he's having trouble think you're this family member who requires oxygen at home. Patient used to see a psychiatrist but due to loss of insurance is not been able to see them. He was also being seen by pain management for chronic back pain. Feels a having withdrawal symptoms since he ran out of medications a couple days ago. Having some nausea vomiting and diarrhea as well as piloerection. Patient having some burning in his abdomen after vomiting. Has no plan currently for suicide. Was seen here not long ago and found to be safe for discharge. Patient feels that outpatient therapy has not benefited him. Thinks he likely needs inpatient admission.  Past Medical  History  Diagnosis Date  . Degenerative disc disease   . Chronic back pain   . Pneumothorax on left   . Emphysema/COPD [facility])    Past Surgical History  Procedure Laterality Date  . Lung surgery      Dr Arlyce Dice-- left lung  . Lung inflation      left lung  . Nose surgery      broken nose   Family History  Problem Relation Age of Onset  . Alpha-1 antitrypsin deficiency Mother   . Emphysema Mother   . Asthma Mother    Social History  Substance Use Topics  . Smoking status: Current Every Day Smoker -- 1.50 packs/day for 12 years    Types: Cigarettes  . Smokeless tobacco: Never Used  . Alcohol Use: No    Review of Systems  Constitutional: Negative for fever and chills.  HENT: Negative for congestion and facial swelling.   Eyes: Negative for discharge and visual disturbance.  Respiratory: Negative for shortness of breath.   Cardiovascular: Negative for chest pain and palpitations.  Gastrointestinal: Negative for vomiting, abdominal pain and diarrhea.  Musculoskeletal: Negative for myalgias and arthralgias.  Skin: Negative for color change and rash.  Neurological: Negative for tremors, syncope and headaches.  Psychiatric/Behavioral: Positive for suicidal ideas. Negative for confusion and dysphoric mood.      Allergies  Effexor; Hydrocodone-acetaminophen; Ibuprofen; Lyrica; Neurontin; Nsaids; and Tramadol hcl  Home Medications   Prior to Admission medications   Medication Sig Start Date End Date Taking? Authorizing Provider  acetaminophen (TYLENOL) 500 MG tablet Take 1,000 mg  by mouth every 6 (six) hours as needed for moderate pain.   Yes Historical Provider, MD  budesonide-formoterol (SYMBICORT) 80-4.5 MCG/ACT inhaler Inhale 2 puffs into the lungs 2 (two) times daily as needed (shortness of breathe.).   Yes Historical Provider, MD  loperamide (IMODIUM) 2 MG capsule Take 2-4 mg by mouth as needed for diarrhea or loose stools.   Yes Historical Provider, MD  traZODone  (DESYREL) 100 MG tablet Take 1 tablet (100 mg total) by mouth at bedtime as needed for sleep. 01/08/15  Yes Niel Hummer, NP  Multiple Vitamin (MULTIVITAMIN WITH MINERALS) TABS tablet Take 1 tablet by mouth daily. 01/08/15   Niel Hummer, NP   BP 106/75 mmHg  Pulse 79  Temp(Src) 98.4 F (36.9 C) (Oral)  Resp 16  SpO2 99% Physical Exam  Constitutional: He is oriented to person, place, and time. He appears well-developed and well-nourished.  HENT:  Head: Normocephalic and atraumatic.  Eyes: EOM are normal. Pupils are equal, round, and reactive to light.  Neck: Normal range of motion. Neck supple. No JVD present.  Cardiovascular: Normal rate and regular rhythm.  Exam reveals no gallop and no friction rub.   No murmur heard. Pulmonary/Chest: No respiratory distress. He has no wheezes.  Abdominal: He exhibits no distension. There is no tenderness. There is no rebound and no guarding.  Musculoskeletal: Normal range of motion.  Neurological: He is alert and oriented to person, place, and time.  Skin: No rash noted. No pallor.  Psychiatric: He has a normal mood and affect. His behavior is normal.  Nursing note and vitals reviewed.   ED Course  Procedures (including critical care time) Labs Review Labs Reviewed  COMPREHENSIVE METABOLIC PANEL - Abnormal; Notable for the following:    Potassium 3.4 (*)    Glucose, Bld 110 (*)    ALT 14 (*)    All other components within normal limits  CBC - Abnormal; Notable for the following:    WBC 12.1 (*)    All other components within normal limits  URINE RAPID DRUG SCREEN, HOSP PERFORMED - Abnormal; Notable for the following:    Opiates POSITIVE (*)    Benzodiazepines POSITIVE (*)    Tetrahydrocannabinol POSITIVE (*)    All other components within normal limits  LIPASE, BLOOD  URINALYSIS, ROUTINE W REFLEX MICROSCOPIC (NOT AT [facility])  ETHANOL  SALICYLATE LEVEL  ACETAMINOPHEN LEVEL    Imaging Review No results found. I have personally  reviewed and evaluated these images and lab results as part of my medical decision-making.   EKG Interpretation None      MDM   Final diagnoses:  Suicidal ideation    40 yo M with a chief complaint of suicidal ideation. Patient with no active plan currently. Has never attempted previously. Will have TTS evaluate.  Will admit.   The patients results and plan were reviewed and discussed.   Any x-rays performed were independently reviewed by myself.   Differential diagnosis were considered with the presenting HPI.  Medications  gi cocktail (Maalox,Lidocaine,Donnatal) (30 mLs Oral Given 03/20/15 1754)  loperamide (IMODIUM) capsule 2 mg (2 mg Oral Given 03/20/15 1754)    Filed Vitals:   03/20/15 1427 03/20/15 1733  BP: 113/86 106/75  Pulse: 93 79  Temp: 98.2 F (36.8 C) 98.4 F (36.9 C)  TempSrc: Oral Oral  Resp: 18 16  SpO2: 100% 99%    Final diagnoses:  Suicidal ideation    Admission/ observation were discussed with the admitting physician,  patient and/or family and they are comfortable with the plan.    Deno Etienne, DO 03/22/15 0001

## 2015-03-20 NOTE — ED Notes (Signed)
Pt c/o generalized abdominal cramping, vomiting, nausea, and diarrhea on-going x 2 days. Here for suicidal ideation, denies HI. Pt states he would shoot himself with a gun if he had one.

## 2015-03-20 NOTE — ED Notes (Signed)
Report called to RN Richardson Landry, [facility], Obs. Unit.  Pending [facility] transport.

## 2015-03-20 NOTE — Progress Notes (Signed)
Admission Note:  Brett Beck admitted to Obs bed 4 from [facility].  Pt states for last several weeks he has had thoughts of hopelessness, helplessness, inability to cope over loss of job and lack of future opportunities.  Pt has chronic back pain and abuses oxycodone for several years.  Pt states he has been off his meds. for several months and wants to get back on his medications.  Stressors include pressure of taking care of of his mother and legal charges for driving without a license and has court date in 2 days.    Pt arrives disheveled, quiet, cooperative, has good eye contact.  Pt has been in Obs before. Pt currently denies SI, HI, AV/H.  Pt asks for snacks and questions what meds have been ordered for him.  Pt has been given snacks, drinks and medications.  Pt was continuously observed on unit except when in bathroom.

## 2015-03-20 NOTE — ED Notes (Signed)
Pt sleeping at present, no distress noted, calm & cooperative. Remains SI, monitoring for safety, Q 15 min checks in effect.

## 2015-03-20 NOTE — BH Assessment (Signed)
Patient accepted to [facility] OBS Unit #4 by Dr. Darleene Cleaver and Reginold Agent, NP. Patient assigned to OBS #4. Nursing report [phone]. Support paperwork completed. Patient is voluntary and [facility] will provide transportation. The transportation arrangements will need to be made by nursing staff. Patient may come to [facility] after 8:30pm.

## 2015-03-20 NOTE — H&P (Signed)
[facility] OBS UNIT H&P  Patient Identification: Brett Beck MRN:  [id number] Date of Evaluation:  03/20/2015 Chief Complaint:  Passive SI Principal Diagnosis: Polysubstance Induced Mood Disorder, GAD Patient Active Problem List   Diagnosis Date Noted  . Substance induced mood disorder ([employer]) [F19.94] 03/20/2015  . GAD (generalized anxiety disorder) [F41.1]   . Opiate abuse, continuous [F11.10] 01/07/2015  . 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 MDD (major depressive disorder), recurrent episode, severe ([place]) [F33.2] 01/06/2015  . Bullous emphysema ([place]) [J43.9] 11/30/2012  . COPD (chronic obstructive pulmonary disease) with emphysema ([place]) [J43.9] 11/02/2012  . Atypical chest pain [R07.89] 11/02/2012  . ANXIETY [F41.1] 08/01/2009  . DEPRESSION [F32.9] 08/01/2009  . ALLERGIC RHINITIS [J30.9] 08/01/2009  . PAIN IN JOINT, MULTIPLE SITES [M25.50] 08/01/2009  . BACK PAIN, LUMBAR, CHRONIC [M54.5] 08/01/2009  . HEADACHE [R51] 08/01/2009  . DIARRHEA, CHRONIC [R19.7] 08/01/2009   History of Present Illness: Patient is a 40 y/o WM with long standing hx of opoid dependence and subsequent diversion but is denying any use of Heroin, Cocaine  or any other illicit drugs. Patient is endorsing worsening passive SI x two months due to finances, mom failing health and continued discord in the family He is feeling hopeless,helpless,worthless with low self esteem. He is denying any AVH, HI, paranoia or delusional thoughts. He has been having increased anxiety  Symptoms along with frequent panic attacks. Associated Signs/Symptoms: Depression Symptoms:  depressed mood, feelings of worthlessness/guilt, hopelessness, suicidal thoughts without plan, (Hypo) Manic Symptoms:  n/a Anxiety Symptoms:  Excessive Worry, Panic Symptoms, Psychotic Symptoms:  n/a PTSD Symptoms: n/a Total Time spent with patient: 30 minutes  Past Psychiatric History: See HPI  Risk to Self:   Risk to Others:    Prior Inpatient Therapy:   Prior Outpatient Therapy:    Alcohol Screening:   Substance Abuse History in the last 12 months:  Yes.   Consequences of Substance Abuse: Withdrawal Symptoms:   Tremors Previous Psychotropic Medications: Yes  Psychological Evaluations: Yes  Past Medical History: COPD Past Medical History  Diagnosis Date  . Degenerative disc disease   . Chronic back pain   . Pneumothorax on left   . Emphysema/COPD [facility])     Past Surgical History  Procedure Laterality Date  . Lung surgery      Dr Arlyce Dice-- left lung  . Lung inflation      left lung  . Nose surgery      broken nose   Family History:  Family History  Problem Relation Age of Onset  . Alpha-1 antitrypsin deficiency Mother   . Emphysema Mother   . Asthma Mother    Family Psychiatric  History: ( see HPI) Social History:  History  Alcohol Use No     History  Drug Use  . Yes  . Special: Oxycodone    Social History   Social History  . Marital Status: Single    Spouse Name: N/A  . Number of Children: N/A  . Years of Education: N/A   Occupational History  . N/A    Social History Main Topics  . Smoking status: Current Every Day Smoker -- 1.50 packs/day for 12 years    Types: Cigarettes  . Smokeless tobacco: Never Used  . Alcohol Use: No  . Drug Use: Yes    Special: Oxycodone  . Sexual Activity: No   Other Topics Concern  . Not on file   Social History Narrative   Additional Social History:    History  of alcohol / drug use?: Yes (oxycodone) Longest period of sobriety (when/how long): unable to recall Negative Consequences of Use: Financial, Legal, Personal relationships Withdrawal Symptoms: Fever / Chills, Irritability, Nausea / Vomiting, Sweats, Tremors, Weakness, Diarrhea Name of Substance 1: Oxycodone 1 - Age of First Use: 40 yrs old  1 - Amount (size/oz): "Depends on the day 7m's-90mg's" 1 - Frequency: daily  1 - Duration: 6 yrs ("5 of those yrs they were legally  prescribed" 1 - Last Use / Amount: "2 nights ago" Name of Substance 2: Klonopin "I don't abuse..I take for my anxiety and also I take to help me steep" 2 - Age of First Use: 20's 2 - Amount (size/oz): 122m2 - Frequency: daily  2 - Duration: 16 yrs  2 - Last Use / Amount: 03/19/2015                Allergies:   Allergies  Allergen Reactions  . Effexor [Venlafaxine] Diarrhea  . Hydrocodone-Acetaminophen     REACTION: severe headaches  . Ibuprofen Other (See Comments)    Severe stomach issues  . Lyrica [Pregabalin] Diarrhea    Swelling in the feet  . Neurontin [Gabapentin] Other (See Comments)    Severe stomach issues  . Nsaids Other (See Comments)    Severe stomach issues  . Tramadol Hcl     REACTION: diarrhea, abdominal pain   Lab Results:  Results for orders placed or performed during the hospital encounter of 03/20/15 (from the past 48 hour(s))  Urinalysis, Routine w reflex microscopic ([facility]    Status: None   Collection Time: 03/20/15  3:07 PM  Result Value Ref Range   Color, Urine YELLOW YELLOW   APPearance CLEAR CLEAR   Specific Gravity, Urine 1.020 1.005 - 1.030   pH 6.0 5.0 - 8.0   Glucose, UA NEGATIVE NEGATIVE mg/dL   Hgb urine dipstick NEGATIVE NEGATIVE   Bilirubin Urine NEGATIVE NEGATIVE   Ketones, ur NEGATIVE NEGATIVE mg/dL   Protein, ur NEGATIVE NEGATIVE mg/dL   Nitrite NEGATIVE NEGATIVE   Leukocytes, UA NEGATIVE NEGATIVE    Comment: MICROSCOPIC NOT DONE ON URINES WITH NEGATIVE PROTEIN, BLOOD, LEUKOCYTES, NITRITE, OR GLUCOSE <1000 mg/dL.  Urine rapid drug screen (hosp performed) ([facility]    Status: Abnormal   Collection Time: 03/20/15  3:07 PM  Result Value Ref Range   Opiates POSITIVE (A) NONE DETECTED   Cocaine NONE DETECTED NONE DETECTED   Benzodiazepines POSITIVE (A) NONE DETECTED   Amphetamines NONE DETECTED NONE DETECTED   Tetrahydrocannabinol POSITIVE (A) NONE DETECTED   Barbiturates NONE DETECTED NONE DETECTED    Comment:         DRUG SCREEN FOR MEDICAL PURPOSES ONLY.  IF CONFIRMATION IS NEEDED FOR ANY PURPOSE, NOTIFY LAB WITHIN 5 DAYS.        LOWEST DETECTABLE LIMITS FOR URINE DRUG SCREEN Drug Class       Cutoff (ng/mL) Amphetamine      1000 Barbiturate      200 Benzodiazepine   20202ricyclics       30542piates          300 Cocaine          300 THC              50   Lipase, blood     Status: None   Collection Time: 03/20/15  3:19 PM  Result Value Ref Range   Lipase 26 11 - 51 U/L  Comprehensive  metabolic panel     Status: Abnormal   Collection Time: 03/20/15  3:19 PM  Result Value Ref Range   Sodium 136 135 - 145 mmol/L   Potassium 3.4 (L) 3.5 - 5.1 mmol/L   Chloride 101 101 - 111 mmol/L   CO2 26 22 - 32 mmol/L   Glucose, Bld 110 (H) 65 - 99 mg/dL   BUN 13 6 - 20 mg/dL   Creatinine, Ser 1.13 0.61 - 1.24 mg/dL   Calcium 9.3 8.9 - 10.3 mg/dL   Total Protein 7.5 6.5 - 8.1 g/dL   Albumin 4.6 3.5 - 5.0 g/dL   AST 23 15 - 41 U/L   ALT 14 (L) 17 - 63 U/L   Alkaline Phosphatase 89 38 - 126 U/L   Total Bilirubin 0.6 0.3 - 1.2 mg/dL   GFR calc non Af Amer >60 >60 mL/min   GFR calc Af Amer >60 >60 mL/min    Comment: (NOTE) The eGFR has been calculated using the CKD EPI equation. This calculation has not been validated in all clinical situations. eGFR's persistently <60 mL/min signify possible Chronic Kidney Disease.    Anion gap 9 5 - 15  CBC     Status: Abnormal   Collection Time: 03/20/15  3:19 PM  Result Value Ref Range   WBC 12.1 (H) 4.0 - 10.5 K/uL   RBC 5.05 4.22 - 5.81 MIL/uL   Hemoglobin 16.5 13.0 - 17.0 g/dL   HCT 48.2 39.0 - 52.0 %   MCV 95.4 78.0 - 100.0 fL   MCH 32.7 26.0 - 34.0 pg   MCHC 34.2 30.0 - 36.0 g/dL   RDW 12.8 11.5 - 15.5 %   Platelets 375 150 - 400 K/uL  Ethanol (ETOH)     Status: None   Collection Time: 03/20/15  3:23 PM  Result Value Ref Range   Alcohol, Ethyl (B) <5 <5 mg/dL    Comment:        LOWEST DETECTABLE LIMIT FOR SERUM ALCOHOL IS 5 mg/dL FOR MEDICAL  PURPOSES ONLY   Salicylate level     Status: None   Collection Time: 03/20/15  3:23 PM  Result Value Ref Range   Salicylate Lvl <3.7 2.8 - 30.0 mg/dL  Acetaminophen level     Status: None   Collection Time: 03/20/15  3:23 PM  Result Value Ref Range   Acetaminophen (Tylenol), Serum 16 10 - 30 ug/mL    Comment:        THERAPEUTIC CONCENTRATIONS VARY SIGNIFICANTLY. A RANGE OF 10-30 ug/mL MAY BE AN EFFECTIVE CONCENTRATION FOR MANY PATIENTS. HOWEVER, SOME ARE BEST TREATED AT CONCENTRATIONS OUTSIDE THIS RANGE. ACETAMINOPHEN CONCENTRATIONS >150 ug/mL AT 4 HOURS AFTER INGESTION AND >50 ug/mL AT 12 HOURS AFTER INGESTION ARE OFTEN ASSOCIATED WITH TOXIC REACTIONS.     Metabolic Disorder Labs:  No results found for: HGBA1C, MPG No results found for: PROLACTIN No results found for: CHOL, TRIG, HDL, CHOLHDL, VLDL, LDLCALC  Current Medications: Current Facility-Administered Medications  Medication Dose Route Frequency Provider Last Rate Last Dose  . alum & mag hydroxide-simeth (MAALOX/MYLANTA) 200-200-20 MG/5ML suspension 30 mL  30 mL Oral Q4H PRN Laverle Hobby, PA-C      . budesonide-formoterol (SYMBICORT) 80-4.5 MCG/ACT inhaler 2 puff  2 puff Inhalation BID PRN Laverle Hobby, PA-C      . dicyclomine (BENTYL) tablet 20 mg  20 mg Oral Q6H PRN Laverle Hobby, PA-C      . hydrOXYzine (ATARAX/VISTARIL) tablet 25 mg  25 mg Oral Q6H PRN Laverle Hobby, PA-C      . loperamide (IMODIUM) capsule 2-4 mg  2-4 mg Oral PRN Laverle Hobby, PA-C      . magnesium hydroxide (MILK OF MAGNESIA) suspension 30 mL  30 mL Oral Daily PRN Laverle Hobby, PA-C      . methocarbamol (ROBAXIN) tablet 500 mg  500 mg Oral Q8H PRN Laverle Hobby, PA-C      . ondansetron (ZOFRAN-ODT) disintegrating tablet 4 mg  4 mg Oral Q6H PRN Laverle Hobby, PA-C      . QUEtiapine (SEROQUEL) tablet 50 mg  50 mg Oral QHS,MR X 1 Spencer E Simon, PA-C      . traZODone (DESYREL) tablet 100 mg  100 mg Oral QHS PRN Laverle Hobby, PA-C       PTA Medications: Prescriptions prior to admission  Medication Sig Dispense Refill Last Dose  . acetaminophen (TYLENOL) 500 MG tablet Take 1,000 mg by mouth every 6 (six) hours as needed for moderate pain.   03/20/2015 at Unknown time  . budesonide-formoterol (SYMBICORT) 80-4.5 MCG/ACT inhaler Inhale 2 puffs into the lungs 2 (two) times daily as needed (shortness of breathe.).   Past Week at Unknown time  . loperamide (IMODIUM) 2 MG capsule Take 2-4 mg by mouth as needed for diarrhea or loose stools.   Past Week at Unknown time  . Multiple Vitamin (MULTIVITAMIN WITH MINERALS) TABS tablet Take 1 tablet by mouth daily.     . traZODone (DESYREL) 100 MG tablet Take 1 tablet (100 mg total) by mouth at bedtime as needed for sleep. 30 tablet 0 Past Month at Unknown time    Musculoskeletal: Strength & Muscle Tone: within normal limits Gait & Station: normal Patient leans: N/A  Psychiatric Specialty Exam: Physical Exam  Nursing note and vitals reviewed. Constitutional: He is oriented to person, place, and time.  HENT:  Head: Normocephalic.  Eyes: Pupils are equal, round, and reactive to light.  Neurological: He is alert and oriented to person, place, and time. No cranial nerve deficit.  Skin: Skin is warm and dry.  Psychiatric: Thought content normal. His mood appears anxious. He is agitated. Cognition and memory are impaired. He exhibits a depressed mood.    Review of Systems  Respiratory: Positive for cough, sputum production, shortness of breath and wheezing.   Psychiatric/Behavioral: Positive for depression, suicidal ideas and substance abuse.  All other systems reviewed and are negative.   Blood pressure 96/70, pulse 88, temperature 98.6 F (37 C), temperature source Oral, resp. rate 18, height 6' 1"  (1.854 m), weight 63.504 kg (140 lb), SpO2 96 %.Body mass index is 18.47 kg/(m^2).  General Appearance: Casual and Disheveled  Eye Contact::  Good  Speech:  Clear and  Coherent  Volume:  Normal  Mood:  Anxious and Depressed  Affect:  Congruent  Thought Process:  Circumstantial  Orientation:  Full (Time, Place, and Person)  Thought Content:  Negative  Suicidal Thoughts:  Yes.  without intent/plan  Homicidal Thoughts:  No  Memory:  Immediate;   Fair  Judgement:  Good  Insight:  Lacking  Psychomotor Activity:  Negative  Concentration:  Fair  Recall:  [employer] of Knowledge:Poor  Language: Poor  Akathisia:  No  Handed:  Right  AIMS (if indicated):     Assets:  Desire for Improvement  ADL's:  Intact  Cognition: WNL  Sleep:        Treatment Plan Summary: Plan Admit  to Obs for crises intervention, safety and stabilization.  TTS to further make plans for disposition  Observation Level/Precautions:  Continuous Observation  Laboratory:    Psychotherapy:    Medications:    Consultations:    Discharge Concerns:    Estimated LOS:  Other:     I certify that inpatient services furnished can reasonably be expected to improve the patient's condition.   SIMON,SPENCER E 1/24/201710:11 PM     ADDENDUM: Removed the "I certify that inpatient services...Marland KitchenMarland Kitchen" Changed "Psychiatric Admission Assessment Adult" to "[place] H&P". No other changes.   [name], FNP 03/21/2015 8:50 AM

## 2015-03-20 NOTE — ED Notes (Signed)
Pt transferred to room 35 and went straight to sleep.  Unable to assess at this time but no distress noted.  15 minute checks and video monitoring continue.

## 2015-03-21 DIAGNOSIS — R45851 Suicidal ideations: Secondary | ICD-10-CM

## 2015-03-21 DIAGNOSIS — F1123 Opioid dependence with withdrawal: Secondary | ICD-10-CM | POA: Diagnosis present

## 2015-03-21 DIAGNOSIS — F411 Generalized anxiety disorder: Secondary | ICD-10-CM | POA: Diagnosis not present

## 2015-03-21 DIAGNOSIS — F1994 Other psychoactive substance use, unspecified with psychoactive substance-induced mood disorder: Secondary | ICD-10-CM

## 2015-03-21 DIAGNOSIS — F1193 Opioid use, unspecified with withdrawal: Secondary | ICD-10-CM | POA: Diagnosis present

## 2015-03-21 MED ORDER — HYDROXYZINE HCL 25 MG PO TABS: 25.0000 mg | ORAL_TABLET | Freq: Four times a day (QID) | ORAL | Status: DC | PRN

## 2015-03-21 MED ORDER — ACETAMINOPHEN 500 MG PO TABS
500.0000 mg | ORAL_TABLET | Freq: Four times a day (QID) | ORAL | Status: DC | PRN
  Administered 2015-03-21 – 2015-03-22 (×3): 500 mg via ORAL
  Filled 2015-03-21 (×7): qty 1

## 2015-03-21 MED ORDER — CLONIDINE HCL 0.1 MG PO TABS: 0.1000 mg | ORAL_TABLET | ORAL | Status: DC

## 2015-03-21 MED ORDER — LORAZEPAM 1 MG PO TABS
1.0000 mg | ORAL_TABLET | Freq: Three times a day (TID) | ORAL | Status: DC | PRN
  Administered 2015-03-21 – 2015-03-22 (×3): 1 mg via ORAL
  Filled 2015-03-21 (×3): qty 1

## 2015-03-21 MED ORDER — DICYCLOMINE HCL 20 MG PO TABS
20.0000 mg | ORAL_TABLET | Freq: Four times a day (QID) | ORAL | Status: DC | PRN
  Administered 2015-03-21 – 2015-03-22 (×3): 20 mg via ORAL
  Filled 2015-03-21 (×3): qty 1

## 2015-03-21 MED ORDER — LOPERAMIDE HCL 2 MG PO CAPS
2.0000 mg | ORAL_CAPSULE | ORAL | Status: DC | PRN
  Administered 2015-03-21 – 2015-03-22 (×2): 2 mg via ORAL
  Filled 2015-03-21 (×2): qty 1

## 2015-03-21 MED ORDER — CLONIDINE HCL 0.1 MG PO TABS
0.1000 mg | ORAL_TABLET | Freq: Four times a day (QID) | ORAL | Status: DC
  Administered 2015-03-21 (×4): 0.1 mg via ORAL
  Filled 2015-03-21 (×4): qty 1

## 2015-03-21 MED ORDER — METHOCARBAMOL 500 MG PO TABS: 500.0000 mg | ORAL_TABLET | Freq: Three times a day (TID) | ORAL | Status: DC | PRN

## 2015-03-21 MED ORDER — CLONIDINE HCL 0.1 MG PO TABS: 0.1000 mg | ORAL_TABLET | Freq: Every day | ORAL | Status: DC

## 2015-03-21 MED ORDER — QUETIAPINE FUMARATE 50 MG PO TABS
50.0000 mg | ORAL_TABLET | Freq: Every evening | ORAL | Status: DC | PRN
  Administered 2015-03-21: 50 mg via ORAL
  Filled 2015-03-21: qty 1

## 2015-03-21 MED ORDER — ONDANSETRON 4 MG PO TBDP
4.0000 mg | ORAL_TABLET | Freq: Four times a day (QID) | ORAL | Status: DC | PRN
  Administered 2015-03-21: 4 mg via ORAL
  Filled 2015-03-21: qty 1

## 2015-03-21 NOTE — BHH Counselor (Signed)
Pt will be re-evaluated in the morning by the A.M. extender. Pt has a long standing hx of opoid dependence and subsequent diversion but is denying any use of Heroin, Cocaine or any other illicit drugs. Patient is endorsing worsening passive SI x two months due to finances, mom failing health and continued discord in the family He is feeling hopeless,helpless,worthless with low self esteem. He is denying any AVH, HI, paranoia or delusional thoughts. He has been having increased anxiety Symptoms along with frequent panic attacks. Pt would benefit from receiving OPT resources to assist with decreasing his depressive symptoms and medication management.    Redmond Pulling, MA OBS Counselor

## 2015-03-22 DIAGNOSIS — F411 Generalized anxiety disorder: Secondary | ICD-10-CM | POA: Diagnosis not present

## 2015-03-22 DIAGNOSIS — R45851 Suicidal ideations: Secondary | ICD-10-CM | POA: Diagnosis not present

## 2015-03-22 DIAGNOSIS — F1994 Other psychoactive substance use, unspecified with psychoactive substance-induced mood disorder: Secondary | ICD-10-CM | POA: Diagnosis not present

## 2015-03-22 MED ORDER — NICOTINE 21 MG/24HR TD PT24
21.0000 mg | MEDICATED_PATCH | Freq: Every day | TRANSDERMAL | Status: DC
Start: 2015-03-22 — End: 2015-04-16

## 2015-03-22 MED ORDER — QUETIAPINE FUMARATE 50 MG PO TABS: 50.0000 mg | ORAL_TABLET | Freq: Every evening | ORAL | Status: DC | PRN

## 2015-03-22 MED ORDER — HYDROXYZINE HCL 25 MG PO TABS: 25.0000 mg | ORAL_TABLET | Freq: Four times a day (QID) | ORAL | Status: DC | PRN

## 2015-03-22 NOTE — BHH Counselor (Signed)
Pt d/c from OBS unit [name]. Pt has outpatient resources and was recommended to follow up with [facility] and/ or [facility] for continuation of care. Pt signed d/c papers and was escorted to lobby area at [facility]. Nic Lampe K. Glendon Axe, [facility]  Counselor 03/22/2015 1:36 PM

## 2015-03-22 NOTE — Progress Notes (Signed)
[facility] OBS UNIT PROGRESS NOTE  Patient Identification: Brett Beck MRN:  [id number] Date of Evaluation:  03/21/2015 Chief Complaint:  Passive SI Principal Diagnosis: Polysubstance Induced Mood Disorder, GAD Patient Active Problem List   Diagnosis Date Noted  . Substance induced mood disorder ([place]) [F19.94] 03/20/2015  . GAD (generalized anxiety disorder) [F41.1]   . Opiate abuse, continuous [F11.10] 01/07/2015  . 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 MDD (major depressive disorder), recurrent episode, severe ([place]) [F33.2] 01/06/2015  . Bullous emphysema ([place]) [J43.9] 11/30/2012  . COPD (chronic obstructive pulmonary disease) with emphysema ([place]) [J43.9] 11/02/2012  . Atypical chest pain [R07.89] 11/02/2012  . ANXIETY [F41.1] 08/01/2009  . DEPRESSION [F32.9] 08/01/2009  . ALLERGIC RHINITIS [J30.9] 08/01/2009  . PAIN IN JOINT, MULTIPLE SITES [M25.50] 08/01/2009  . BACK PAIN, LUMBAR, CHRONIC [M54.5] 08/01/2009  . HEADACHE [R51] 08/01/2009  . DIARRHEA, CHRONIC [R19.7] 08/01/2009   Subjective: Pt seen and chart reviewed. Pt is alert/oriented x4, calm, cooperative, and appropriate to situation. Pt denies suicidal/homicidal ideation and psychosis and does not appear to be responding to internal stimuli. Pt reports that he has been using 200-362m daily of roxicodone and pays for it by illegal means. Pt reports that he feels overwhelmed and becomes suicidal when thinking about his addiction. Pt continues to sweat, have chills, stomach pain, and loose stools and is on the clonidine protocol (it was just restarted now as it did not carry over from the ED).   History of Present Illness: Patient is a 40y/o WM with long standing hx of opoid dependence and subsequent diversion but is denying any use of Heroin, Cocaine  or any other illicit drugs. Patient is endorsing worsening passive SI x two months due to finances, mom failing health and continued discord in the family He is  feeling hopeless,helpless,worthless with low self esteem. He is denying any AVH, HI, paranoia or delusional thoughts. He has been having increased anxiety  Symptoms along with frequent panic attacks.  Total Time spent with patient: 25 minutes  Past Psychiatric History: See HPI   Risk to Self: Is patient at risk for suicide?: No Risk to Others:   Prior Inpatient Therapy:   Prior Outpatient Therapy:    Alcohol Screening:   Substance Abuse History in the last 12 months:  Yes.   Consequences of Substance Abuse: Withdrawal Symptoms:   Tremors Previous Psychotropic Medications: Yes  Psychological Evaluations: Yes  Past Medical History: COPD Past Medical History  Diagnosis Date  . Degenerative disc disease   . Chronic back pain   . Pneumothorax on left   . Emphysema/COPD ([facility]     Past Surgical History  Procedure Laterality Date  . Lung surgery      Dr BArlyce Dice- left lung  . Lung inflation      left lung  . Nose surgery      broken nose   Family History:  Family History  Problem Relation Age of Onset  . Alpha-1 antitrypsin deficiency Mother   . Emphysema Mother   . Asthma Mother    Family Psychiatric  History: ( see HPI) Social History:  History  Alcohol Use No     History  Drug Use  . Yes  . Special: Oxycodone    Social History   Social History  . Marital Status: Single    Spouse Name: N/A  . Number of Children: N/A  . Years of Education: N/A   Occupational History  . N/A    Social History  Main Topics  . Smoking status: Current Every Day Smoker -- 1.50 packs/day for 12 years    Types: Cigarettes  . Smokeless tobacco: Never Used  . Alcohol Use: No  . Drug Use: Yes    Special: Oxycodone  . Sexual Activity: No   Other Topics Concern  . None   Social History Narrative   Additional Social History:    History of alcohol / drug use?: Yes (oxycodone) Longest period of sobriety (when/how long): unable to recall Negative Consequences of Use: Financial,  Legal, Personal relationships Withdrawal Symptoms: Fever / Chills, Irritability, Nausea / Vomiting, Sweats, Tremors, Weakness, Diarrhea Name of Substance 1: Oxycodone 1 - Age of First Use: 40 yrs old  1 - Amount (size/oz): "Depends on the day 78m's-90mg's" 1 - Frequency: daily  1 - Duration: 6 yrs ("5 of those yrs they were legally prescribed" 1 - Last Use / Amount: "2 nights ago" Name of Substance 2: Klonopin "I don't abuse..I take for my anxiety and also I take to help me steep" 2 - Age of First Use: 20's 2 - Amount (size/oz): 161m2 - Frequency: daily  2 - Duration: 16 yrs  2 - Last Use / Amount: 03/19/2015                Allergies:   Allergies  Allergen Reactions  . Effexor [Venlafaxine] Diarrhea  . Hydrocodone-Acetaminophen     REACTION: severe headaches  . Ibuprofen Other (See Comments)    Severe stomach issues  . Lyrica [Pregabalin] Diarrhea    Swelling in the feet  . Neurontin [Gabapentin] Other (See Comments)    Severe stomach issues  . Nsaids Other (See Comments)    Severe stomach issues  . Tramadol Hcl     REACTION: diarrhea, abdominal pain   Lab Results:  Results for orders placed or performed during the hospital encounter of 03/20/15 (from the past 48 hour(s))  Urinalysis, Routine w reflex microscopic ([facility]    Status: None   Collection Time: 03/20/15  3:07 PM  Result Value Ref Range   Color, Urine YELLOW YELLOW   APPearance CLEAR CLEAR   Specific Gravity, Urine 1.020 1.005 - 1.030   pH 6.0 5.0 - 8.0   Glucose, UA NEGATIVE NEGATIVE mg/dL   Hgb urine dipstick NEGATIVE NEGATIVE   Bilirubin Urine NEGATIVE NEGATIVE   Ketones, ur NEGATIVE NEGATIVE mg/dL   Protein, ur NEGATIVE NEGATIVE mg/dL   Nitrite NEGATIVE NEGATIVE   Leukocytes, UA NEGATIVE NEGATIVE    Comment: MICROSCOPIC NOT DONE ON URINES WITH NEGATIVE PROTEIN, BLOOD, LEUKOCYTES, NITRITE, OR GLUCOSE <1000 mg/dL.  Urine rapid drug screen (hosp performed) ([facility]    Status: Abnormal    Collection Time: 03/20/15  3:07 PM  Result Value Ref Range   Opiates POSITIVE (A) NONE DETECTED   Cocaine NONE DETECTED NONE DETECTED   Benzodiazepines POSITIVE (A) NONE DETECTED   Amphetamines NONE DETECTED NONE DETECTED   Tetrahydrocannabinol POSITIVE (A) NONE DETECTED   Barbiturates NONE DETECTED NONE DETECTED    Comment:        DRUG SCREEN FOR MEDICAL PURPOSES ONLY.  IF CONFIRMATION IS NEEDED FOR ANY PURPOSE, NOTIFY LAB WITHIN 5 DAYS.        LOWEST DETECTABLE LIMITS FOR URINE DRUG SCREEN Drug Class       Cutoff (ng/mL) Amphetamine      1000 Barbiturate      200 Benzodiazepine   20623ricyclics       30762piates  300 Cocaine          300 THC              50   Lipase, blood     Status: None   Collection Time: 03/20/15  3:19 PM  Result Value Ref Range   Lipase 26 11 - 51 U/L  Comprehensive metabolic panel     Status: Abnormal   Collection Time: 03/20/15  3:19 PM  Result Value Ref Range   Sodium 136 135 - 145 mmol/L   Potassium 3.4 (L) 3.5 - 5.1 mmol/L   Chloride 101 101 - 111 mmol/L   CO2 26 22 - 32 mmol/L   Glucose, Bld 110 (H) 65 - 99 mg/dL   BUN 13 6 - 20 mg/dL   Creatinine, Ser 1.13 0.61 - 1.24 mg/dL   Calcium 9.3 8.9 - 10.3 mg/dL   Total Protein 7.5 6.5 - 8.1 g/dL   Albumin 4.6 3.5 - 5.0 g/dL   AST 23 15 - 41 U/L   ALT 14 (L) 17 - 63 U/L   Alkaline Phosphatase 89 38 - 126 U/L   Total Bilirubin 0.6 0.3 - 1.2 mg/dL   GFR calc non Af Amer >60 >60 mL/min   GFR calc Af Amer >60 >60 mL/min    Comment: (NOTE) The eGFR has been calculated using the CKD EPI equation. This calculation has not been validated in all clinical situations. eGFR's persistently <60 mL/min signify possible Chronic Kidney Disease.    Anion gap 9 5 - 15  CBC     Status: Abnormal   Collection Time: 03/20/15  3:19 PM  Result Value Ref Range   WBC 12.1 (H) 4.0 - 10.5 K/uL   RBC 5.05 4.22 - 5.81 MIL/uL   Hemoglobin 16.5 13.0 - 17.0 g/dL   HCT 48.2 39.0 - 52.0 %   MCV 95.4 78.0 -  100.0 fL   MCH 32.7 26.0 - 34.0 pg   MCHC 34.2 30.0 - 36.0 g/dL   RDW 12.8 11.5 - 15.5 %   Platelets 375 150 - 400 K/uL  Ethanol (ETOH)     Status: None   Collection Time: 03/20/15  3:23 PM  Result Value Ref Range   Alcohol, Ethyl (B) <5 <5 mg/dL    Comment:        LOWEST DETECTABLE LIMIT FOR SERUM ALCOHOL IS 5 mg/dL FOR MEDICAL PURPOSES ONLY   Salicylate level     Status: None   Collection Time: 03/20/15  3:23 PM  Result Value Ref Range   Salicylate Lvl <0.2 2.8 - 30.0 mg/dL  Acetaminophen level     Status: None   Collection Time: 03/20/15  3:23 PM  Result Value Ref Range   Acetaminophen (Tylenol), Serum 16 10 - 30 ug/mL    Comment:        THERAPEUTIC CONCENTRATIONS VARY SIGNIFICANTLY. A RANGE OF 10-30 ug/mL MAY BE AN EFFECTIVE CONCENTRATION FOR MANY PATIENTS. HOWEVER, SOME ARE BEST TREATED AT CONCENTRATIONS OUTSIDE THIS RANGE. ACETAMINOPHEN CONCENTRATIONS >150 ug/mL AT 4 HOURS AFTER INGESTION AND >50 ug/mL AT 12 HOURS AFTER INGESTION ARE OFTEN ASSOCIATED WITH TOXIC REACTIONS.     Metabolic Disorder Labs:  No results found for: HGBA1C, MPG No results found for: PROLACTIN No results found for: CHOL, TRIG, HDL, CHOLHDL, VLDL, LDLCALC  Current Medications: Current Facility-Administered Medications  Medication Dose Route Frequency Provider Last Rate Last Dose  . alum & mag hydroxide-simeth (MAALOX/MYLANTA) 200-200-20 MG/5ML suspension 30 mL  30 mL Oral Q4H PRN  Laverle Hobby, PA-C      . budesonide-formoterol (SYMBICORT) 80-4.5 MCG/ACT inhaler 2 puff  2 puff Inhalation BID PRN Laverle Hobby, PA-C      . dicyclomine (BENTYL) tablet 20 mg  20 mg Oral Q6H PRN Laverle Hobby, PA-C   20 mg at 03/21/15 0842  . hydrOXYzine (ATARAX/VISTARIL) tablet 25 mg  25 mg Oral Q6H PRN Laverle Hobby, PA-C   25 mg at 03/21/15 0843  . loperamide (IMODIUM) capsule 2-4 mg  2-4 mg Oral PRN Laverle Hobby, PA-C   2 mg at 03/21/15 4081  . magnesium hydroxide (MILK OF MAGNESIA) suspension  30 mL  30 mL Oral Daily PRN Laverle Hobby, PA-C      . methocarbamol (ROBAXIN) tablet 500 mg  500 mg Oral Q8H PRN Laverle Hobby, PA-C   500 mg at 03/21/15 0843  . nicotine (NICODERM CQ - dosed in mg/24 hours) patch 21 mg  21 mg Transdermal Daily Laverle Hobby, PA-C   21 mg at 03/21/15 0745  . ondansetron (ZOFRAN-ODT) disintegrating tablet 4 mg  4 mg Oral Q6H PRN Laverle Hobby, PA-C   4 mg at 03/20/15 2220  . QUEtiapine (SEROQUEL) tablet 50 mg  50 mg Oral QHS,MR X 1 Spencer E Simon, PA-C   50 mg at 03/20/15 2344  . traZODone (DESYREL) tablet 100 mg  100 mg Oral QHS PRN Laverle Hobby, PA-C       PTA Medications: Prescriptions prior to admission  Medication Sig Dispense Refill Last Dose  . acetaminophen (TYLENOL) 500 MG tablet Take 1,000 mg by mouth every 6 (six) hours as needed for moderate pain.   03/20/2015 at Unknown time  . budesonide-formoterol (SYMBICORT) 80-4.5 MCG/ACT inhaler Inhale 2 puffs into the lungs 2 (two) times daily as needed (shortness of breathe.).   Past Week at Unknown time  . loperamide (IMODIUM) 2 MG capsule Take 2-4 mg by mouth as needed for diarrhea or loose stools.   Past Week at Unknown time  . Multiple Vitamin (MULTIVITAMIN WITH MINERALS) TABS tablet Take 1 tablet by mouth daily.     . traZODone (DESYREL) 100 MG tablet Take 1 tablet (100 mg total) by mouth at bedtime as needed for sleep. 30 tablet 0 Past Month at Unknown time    Musculoskeletal: Strength & Muscle Tone: within normal limits Gait & Station: normal Patient leans: N/A  Psychiatric Specialty Exam: Physical Exam  Nursing note and vitals reviewed. Constitutional: He is oriented to person, place, and time.  HENT:  Head: Normocephalic.  Eyes: Pupils are equal, round, and reactive to light.  Neurological: He is alert and oriented to person, place, and time. No cranial nerve deficit.  Skin: Skin is warm and dry.  Psychiatric: His mood appears anxious. He is agitated. Cognition and memory are  impaired. He exhibits a depressed mood.    Review of Systems  Respiratory: Positive for cough, sputum production, shortness of breath and wheezing.   Psychiatric/Behavioral: Positive for depression, suicidal ideas and substance abuse.  All other systems reviewed and are negative.   Blood pressure 82/54, pulse 116, temperature 97.8 F (36.6 C), temperature source Oral, resp. rate 18, height _0  (1.854 m), weight 63.504 kg (140 lb), SpO2 96 %.Body mass index is 18.47 kg/(m^2).  General Appearance: Casual and Disheveled  Eye Contact::  Good  Speech:  Clear and Coherent  Volume:  Normal  Mood:  Anxious and Depressed  Affect:  Congruent  Thought Process:  Circumstantial  Orientation:  Full (Time, Place, and Person)  Thought Content:  Negative  Suicidal Thoughts:  Yes.  without intent/plan  Homicidal Thoughts:  No  Memory:  Immediate;   Fair  Judgement:  Good  Insight:  Lacking  Psychomotor Activity:  Negative  Concentration:  Fair  Recall:  [employer] of Knowledge:Poor  Language: Poor  Akathisia:  No  Handed:  Right  AIMS (if indicated):     Assets:  Desire for Improvement  ADL's:  Intact  Cognition: WNL  Sleep:       Treatment Plan Summary: Plan Admit to Obs for crises intervention, safety and stabilization.  TTS to further make plans for disposition  *Hold an extra night due to instability/withdrawal  [name], FNP-BC 03/21/2015 10:45 AM

## 2015-03-22 NOTE — Discharge Instructions (Signed)
It is recommended that you follow up with [facility] or [facility] for aftercare and continuation of care for psychiatric care and medication management.

## 2015-03-22 NOTE — Progress Notes (Signed)
Shift Note:  Brett Beck in bed watching TV at shift change.  Alert x 4.  Talkative and friendly.  Pt asking what meds are available to him this shift and what PRN meds he could also have.  Pt [place] and asking for snacks.  Pt states he wants to have his PRN  sleep med switched to Seroquel stating that is all that works.  Sleep med changed.  Snacks given.  Pt denies SI, HI or AV/H.  Pt verbally contracts for safety.  Pt continuously observed on unit except during visits to bathroom.

## 2015-03-22 NOTE — Progress Notes (Signed)
Nursing Discharge Note:  Patient has received all belongings and signed for them as well as signing the Discharge Summary and also given a copy of same, as well as prescriptions. Patient continues to deny any SI/HI/AVH and states understanding of followup appointments and medications prescribed. Patient escorted from Unit by staff member to [place]obby to exit building and walk across road where his car at [employer].

## 2015-03-22 NOTE — [facility] Counselor (Signed)
Pt currently denies SI/HI/AV. Pt would benefit from receiving intensive OPT services and medication management once discharged. Pt will be further evaluated this morning to determine appropriate disposition. Pt was provided support and encouragement while in OBS unit.   Redmond Pulling, MA OBS Counselor

## 2015-03-22 NOTE — Discharge Summary (Signed)
[facility] OBS UNIT DISCHARGE SUMMARY  Patient Identification: Brett Beck MRN:  [id number] Chief Complaint:  Passive SI Principal Diagnosis: Polysubstance Induced Mood Disorder, GAD Patient Active Problem List   Diagnosis Date Noted  . Substance induced mood disorder ([place]) [F19.94] 03/20/2015  . GAD (generalized anxiety disorder) [F41.1]   . Opiate abuse, continuous [F11.10] 01/07/2015  . 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 MDD (major depressive disorder), recurrent episode, severe ([place]) [F33.2] 01/06/2015  . Bullous emphysema ([place]) [J43.9] 11/30/2012  . 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 Atypical chest pain [R07.89] 11/02/2012  . ANXIETY [F41.1] 08/01/2009  . DEPRESSION [F32.9] 08/01/2009  . ALLERGIC RHINITIS [J30.9] 08/01/2009  . PAIN IN JOINT, MULTIPLE SITES [M25.50] 08/01/2009  . BACK PAIN, LUMBAR, CHRONIC [M54.5] 08/01/2009  . HEADACHE [R51] 08/01/2009  . DIARRHEA, CHRONIC [R19.7] 08/01/2009   Discharge update: Pt spent the night again in OBS without incident. His COWS are consistently below 11 and he has follow-up plans for [facility] follow[facility] as coordinated by TTS SW. Pt now able to contract for safety, denies suicidal ideation.   On 03/21/15: Pt seen and chart reviewed. Pt is alert/oriented x4, calm, cooperative, and appropriate to situation. Pt denies suicidal/homicidal ideation and psychosis and does not appear to be responding to internal stimuli. Pt reports that he has been using 200-311m daily of roxicodone and pays for it by illegal means. Pt reports that he feels overwhelmed and becomes suicidal when thinking about his addiction. Pt continues to sweat, have chills, stomach pain, and loose stools and is on the clonidine protocol (it was just restarted now as it did not carry over from the ED).   History of Present Illness: Patient is a 40y/o WM with long standing hx of opoid  dependence and subsequent diversion but is denying any use of Heroin, Cocaine  or any other illicit drugs. Patient is endorsing worsening passive SI x two months due to finances, mom failing health and continued discord in the family He is feeling hopeless,helpless,worthless with low self esteem. He is denying any AVH, HI, paranoia or delusional thoughts. He has been having increased anxiety  Symptoms along with frequent panic attacks.  Total Time spent with patient: 25 minutes  Past Psychiatric History: See HPI   Risk to Self: Is patient at risk for suicide?: No Risk to Others:   Prior Inpatient Therapy:   Prior Outpatient Therapy:    Alcohol Screening:   Substance Abuse History in the last 12 months:  Yes.   Consequences of Substance Abuse: Withdrawal Symptoms:   Tremors Previous Psychotropic Medications: Yes  Psychological Evaluations: Yes  Past Medical History: COPD Past Medical History  Diagnosis Date  . Degenerative disc disease   . Chronic back pain   . Pneumothorax on left   . Emphysema/COPD ([facility]     Past Surgical History  Procedure Laterality Date  . Lung surgery      Dr BArlyce Dice- left lung  . Lung inflation      left lung  . Nose surgery      broken nose   Family History:  Family History  Problem Relation Age of Onset  . Alpha-1 antitrypsin deficiency Mother   . Emphysema Mother   . Asthma Mother    Family Psychiatric  History: ( see HPI) Social History:  History  Alcohol Use No     History  Drug Use  . Yes  . Special: Oxycodone    Social History  Social History  . Marital Status: Single    Spouse Name: N/A  . Number of Children: N/A  . Years of Education: N/A   Occupational History  . N/A    Social History Main Topics  . Smoking status: Current Every Day Smoker -- 1.50 packs/day for 12 years    Types: Cigarettes  . Smokeless tobacco: Never Used  . Alcohol Use: No  . Drug Use: Yes    Special: Oxycodone  . Sexual Activity: No   Other  Topics Concern  . None   Social History Narrative   Additional Social History:    History of alcohol / drug use?: Yes (oxycodone) Longest period of sobriety (when/how long): unable to recall Negative Consequences of Use: Financial, Legal, Personal relationships Withdrawal Symptoms: Fever / Chills, Irritability, Nausea / Vomiting, Sweats, Tremors, Weakness, Diarrhea Name of Substance 1: Oxycodone 1 - Age of First Use: 40 yrs old  1 - Amount (size/oz): "Depends on the day 23m's-90mg's" 1 - Frequency: daily  1 - Duration: 6 yrs ("5 of those yrs they were legally prescribed" 1 - Last Use / Amount: "2 nights ago" Name of Substance 2: Klonopin "I don't abuse..I take for my anxiety and also I take to help me steep" 2 - Age of First Use: 20's 2 - Amount (size/oz): 155m2 - Frequency: daily  2 - Duration: 16 yrs  2 - Last Use / Amount: 03/19/2015                Allergies:   Allergies  Allergen Reactions  . Effexor [Venlafaxine] Diarrhea  . Hydrocodone-Acetaminophen     REACTION: severe headaches  . Ibuprofen Other (See Comments)    Severe stomach issues  . Lyrica [Pregabalin] Diarrhea    Swelling in the feet  . Neurontin [Gabapentin] Other (See Comments)    Severe stomach issues  . Nsaids Other (See Comments)    Severe stomach issues  . Tramadol Hcl     REACTION: diarrhea, abdominal pain   Lab Results:  Results for orders placed or performed during the hospital encounter of 03/20/15 (from the past 48 hour(s))  Urinalysis, Routine w reflex microscopic ([facility]    Status: None   Collection Time: 03/20/15  3:07 PM  Result Value Ref Range   Color, Urine YELLOW YELLOW   APPearance CLEAR CLEAR   Specific Gravity, Urine 1.020 1.005 - 1.030   pH 6.0 5.0 - 8.0   Glucose, UA NEGATIVE NEGATIVE mg/dL   Hgb urine dipstick NEGATIVE NEGATIVE   Bilirubin Urine NEGATIVE NEGATIVE   Ketones, ur NEGATIVE NEGATIVE mg/dL   Protein, ur NEGATIVE NEGATIVE mg/dL   Nitrite NEGATIVE  NEGATIVE   Leukocytes, UA NEGATIVE NEGATIVE    Comment: MICROSCOPIC NOT DONE ON URINES WITH NEGATIVE PROTEIN, BLOOD, LEUKOCYTES, NITRITE, OR GLUCOSE <1000 mg/dL.  Urine rapid drug screen (hosp performed) ([facility]    Status: Abnormal   Collection Time: 03/20/15  3:07 PM  Result Value Ref Range   Opiates POSITIVE (A) NONE DETECTED   Cocaine NONE DETECTED NONE DETECTED   Benzodiazepines POSITIVE (A) NONE DETECTED   Amphetamines NONE DETECTED NONE DETECTED   Tetrahydrocannabinol POSITIVE (A) NONE DETECTED   Barbiturates NONE DETECTED NONE DETECTED    Comment:        DRUG SCREEN FOR MEDICAL PURPOSES ONLY.  IF CONFIRMATION IS NEEDED FOR ANY PURPOSE, NOTIFY LAB WITHIN 5 DAYS.        LOWEST DETECTABLE LIMITS FOR URINE DRUG SCREEN  Drug Class       Cutoff (ng/mL) Amphetamine      1000 Barbiturate      200 Benzodiazepine   419 Tricyclics       379 Opiates          300 Cocaine          300 THC              50   Lipase, blood     Status: None   Collection Time: 03/20/15  3:19 PM  Result Value Ref Range   Lipase 26 11 - 51 U/L  Comprehensive metabolic panel     Status: Abnormal   Collection Time: 03/20/15  3:19 PM  Result Value Ref Range   Sodium 136 135 - 145 mmol/L   Potassium 3.4 (L) 3.5 - 5.1 mmol/L   Chloride 101 101 - 111 mmol/L   CO2 26 22 - 32 mmol/L   Glucose, Bld 110 (H) 65 - 99 mg/dL   BUN 13 6 - 20 mg/dL   Creatinine, Ser 1.13 0.61 - 1.24 mg/dL   Calcium 9.3 8.9 - 10.3 mg/dL   Total Protein 7.5 6.5 - 8.1 g/dL   Albumin 4.6 3.5 - 5.0 g/dL   AST 23 15 - 41 U/L   ALT 14 (L) 17 - 63 U/L   Alkaline Phosphatase 89 38 - 126 U/L   Total Bilirubin 0.6 0.3 - 1.2 mg/dL   GFR calc non Af Amer >60 >60 mL/min   GFR calc Af Amer >60 >60 mL/min    Comment: (NOTE) The eGFR has been calculated using the CKD EPI equation. This calculation has not been validated in all clinical situations. eGFR's persistently <60 mL/min signify possible Chronic Kidney Disease.    Anion gap 9  5 - 15  CBC     Status: Abnormal   Collection Time: 03/20/15  3:19 PM  Result Value Ref Range   WBC 12.1 (H) 4.0 - 10.5 K/uL   RBC 5.05 4.22 - 5.81 MIL/uL   Hemoglobin 16.5 13.0 - 17.0 g/dL   HCT 48.2 39.0 - 52.0 %   MCV 95.4 78.0 - 100.0 fL   MCH 32.7 26.0 - 34.0 pg   MCHC 34.2 30.0 - 36.0 g/dL   RDW 12.8 11.5 - 15.5 %   Platelets 375 150 - 400 K/uL  Ethanol (ETOH)     Status: None   Collection Time: 03/20/15  3:23 PM  Result Value Ref Range   Alcohol, Ethyl (B) <5 <5 mg/dL    Comment:        LOWEST DETECTABLE LIMIT FOR SERUM ALCOHOL IS 5 mg/dL FOR MEDICAL PURPOSES ONLY   Salicylate level     Status: None   Collection Time: 03/20/15  3:23 PM  Result Value Ref Range   Salicylate Lvl <0.2 2.8 - 30.0 mg/dL  Acetaminophen level     Status: None   Collection Time: 03/20/15  3:23 PM  Result Value Ref Range   Acetaminophen (Tylenol), Serum 16 10 - 30 ug/mL    Comment:        THERAPEUTIC CONCENTRATIONS VARY SIGNIFICANTLY. A RANGE OF 10-30 ug/mL MAY BE AN EFFECTIVE CONCENTRATION FOR MANY PATIENTS. HOWEVER, SOME ARE BEST TREATED AT CONCENTRATIONS OUTSIDE THIS RANGE. ACETAMINOPHEN CONCENTRATIONS >150 ug/mL AT 4 HOURS AFTER INGESTION AND >50 ug/mL AT 12 HOURS AFTER INGESTION ARE OFTEN ASSOCIATED WITH TOXIC REACTIONS.     Metabolic Disorder Labs:  No results found for: HGBA1C, MPG No results found  for: PROLACTIN No results found for: CHOL, TRIG, HDL, CHOLHDL, VLDL, LDLCALC  Current Medications: Current Facility-Administered Medications  Medication Dose Route Frequency Provider Last Rate Last Dose  . alum & mag hydroxide-simeth (MAALOX/MYLANTA) 200-200-20 MG/5ML suspension 30 mL  30 mL Oral Q4H PRN Laverle Hobby, PA-C      . budesonide-formoterol (SYMBICORT) 80-4.5 MCG/ACT inhaler 2 puff  2 puff Inhalation BID PRN Laverle Hobby, PA-C      . dicyclomine (BENTYL) tablet 20 mg  20 mg Oral Q6H PRN Laverle Hobby, PA-C   20 mg at 03/21/15 0842  . hydrOXYzine  (ATARAX/VISTARIL) tablet 25 mg  25 mg Oral Q6H PRN Laverle Hobby, PA-C   25 mg at 03/21/15 0843  . loperamide (IMODIUM) capsule 2-4 mg  2-4 mg Oral PRN Laverle Hobby, PA-C   2 mg at 03/21/15 9038  . magnesium hydroxide (MILK OF MAGNESIA) suspension 30 mL  30 mL Oral Daily PRN Laverle Hobby, PA-C      . methocarbamol (ROBAXIN) tablet 500 mg  500 mg Oral Q8H PRN Laverle Hobby, PA-C   500 mg at 03/21/15 0843  . nicotine (NICODERM CQ - dosed in mg/24 hours) patch 21 mg  21 mg Transdermal Daily Laverle Hobby, PA-C   21 mg at 03/21/15 0745  . ondansetron (ZOFRAN-ODT) disintegrating tablet 4 mg  4 mg Oral Q6H PRN Laverle Hobby, PA-C   4 mg at 03/20/15 2220  . QUEtiapine (SEROQUEL) tablet 50 mg  50 mg Oral QHS,MR X 1 Spencer E Simon, PA-C   50 mg at 03/20/15 2344  . traZODone (DESYREL) tablet 100 mg  100 mg Oral QHS PRN Laverle Hobby, PA-C       PTA Medications: Prescriptions prior to admission  Medication Sig Dispense Refill Last Dose  . acetaminophen (TYLENOL) 500 MG tablet Take 1,000 mg by mouth every 6 (six) hours as needed for moderate pain.   03/20/2015 at Unknown time  . budesonide-formoterol (SYMBICORT) 80-4.5 MCG/ACT inhaler Inhale 2 puffs into the lungs 2 (two) times daily as needed (shortness of breathe.).   Past Week at Unknown time  . loperamide (IMODIUM) 2 MG capsule Take 2-4 mg by mouth as needed for diarrhea or loose stools.   Past Week at Unknown time  . Multiple Vitamin (MULTIVITAMIN WITH MINERALS) TABS tablet Take 1 tablet by mouth daily.     . traZODone (DESYREL) 100 MG tablet Take 1 tablet (100 mg total) by mouth at bedtime as needed for sleep. 30 tablet 0 Past Month at Unknown time    Musculoskeletal: Strength & Muscle Tone: within normal limits Gait & Station: normal Patient leans: N/A  Psychiatric Specialty Exam: Physical Exam  Nursing note and vitals reviewed. Constitutional: He is oriented to person, place, and time.  HENT:  Head: Normocephalic.  Eyes:  Pupils are equal, round, and reactive to light.  Neurological: He is alert and oriented to person, place, and time. No cranial nerve deficit.  Skin: Skin is warm and dry.  Psychiatric: His mood appears anxious. He is agitated. Cognition and memory are impaired. He exhibits a depressed mood.    Review of Systems  Respiratory: Positive for cough, sputum production, shortness of breath and wheezing.   Psychiatric/Behavioral: Positive for depression, suicidal ideas and substance abuse.  All other systems reviewed and are negative.   Blood pressure 82/54, pulse 116, temperature 97.8 F (36.6 C), temperature source Oral, resp. rate 18, height 6' 1"  (1.854 m), weight 63.504  kg (140 lb), SpO2 96 %.Body mass index is 18.47 kg/(m^2).  General Appearance: Casual and Disheveled  Eye Contact::  Good  Speech:  Clear and Coherent  Volume:  Normal  Mood:  Anxious and Depressed  Affect:  Congruent  Thought Process:  Circumstantial  Orientation:  Full (Time, Place, and Person)  Thought Content:  Negative  Suicidal Thoughts:  No  Homicidal Thoughts:  No  Memory:  Immediate;   Fair  Judgement:  Good  Insight:  Lacking  Psychomotor Activity:  Negative  Concentration:  Fair  Recall:  [place] of Knowledge:Poor  Language: Poor  Akathisia:  No  Handed:  Right  AIMS (if indicated):     Assets:  Desire for Improvement  ADL's:  Intact  Cognition: WNL  Sleep:       Treatment Plan Summary: -Vistaril 69m q6h prn anxiety #21 (script) -Discontinue clonidine due to acute hypotension (nurses held the doses last night) -Seroquel 532mqhs prn insomnia #14 (script)  Disposition: -Discharge home with outpatient sponsored resources as mentioned above.   WiBenjamine MolaFNP-BC 03/22/2015 12:23PM

## 2015-04-16 ENCOUNTER — Emergency Department (HOSPITAL_BASED_OUTPATIENT_CLINIC_OR_DEPARTMENT_OTHER)
Admission: EM | Admit: 2015-04-16 | Discharge: 2015-04-16 | Disposition: A | Discharge status: Home / Self Care | Payer: 59 | Attending: Emergency Medicine | Admitting: Emergency Medicine

## 2015-04-16 ENCOUNTER — Encounter (HOSPITAL_BASED_OUTPATIENT_CLINIC_OR_DEPARTMENT_OTHER): Payer: Self-pay

## 2015-04-16 DIAGNOSIS — R6883 Chills (without fever): Secondary | ICD-10-CM | POA: Insufficient documentation

## 2015-04-16 DIAGNOSIS — Z8739 Personal history of other diseases of the musculoskeletal system and connective tissue: Secondary | ICD-10-CM | POA: Insufficient documentation

## 2015-04-16 DIAGNOSIS — F1193 Opioid use, unspecified with withdrawal: Secondary | ICD-10-CM

## 2015-04-16 DIAGNOSIS — R61 Generalized hyperhidrosis: Secondary | ICD-10-CM | POA: Insufficient documentation

## 2015-04-16 DIAGNOSIS — Z79899 Other long term (current) drug therapy: Secondary | ICD-10-CM | POA: Insufficient documentation

## 2015-04-16 DIAGNOSIS — R197 Diarrhea, unspecified: Secondary | ICD-10-CM | POA: Insufficient documentation

## 2015-04-16 DIAGNOSIS — R110 Nausea: Secondary | ICD-10-CM | POA: Insufficient documentation

## 2015-04-16 DIAGNOSIS — G8929 Other chronic pain: Secondary | ICD-10-CM | POA: Insufficient documentation

## 2015-04-16 DIAGNOSIS — J439 Emphysema, unspecified: Secondary | ICD-10-CM | POA: Insufficient documentation

## 2015-04-16 DIAGNOSIS — F1123 Opioid dependence with withdrawal: Secondary | ICD-10-CM | POA: Insufficient documentation

## 2015-04-16 DIAGNOSIS — F17210 Nicotine dependence, cigarettes, uncomplicated: Secondary | ICD-10-CM | POA: Insufficient documentation

## 2015-04-16 HISTORY — DX: Opioid abuse, uncomplicated: F11.10

## 2015-04-16 LAB — CBC WITH DIFFERENTIAL/PLATELET
BASOS ABS: 0 10*3/uL (ref 0.0–0.1)
BASOS PCT: 1 %
EOS ABS: 0.1 10*3/uL (ref 0.0–0.7)
EOS PCT: 1 %
HCT: 45.2 % (ref 39.0–52.0)
Hemoglobin: 15.1 g/dL (ref 13.0–17.0)
Lymphocytes Relative: 17 %
Lymphs Abs: 1.2 10*3/uL (ref 0.7–4.0)
MCH: 31.1 pg (ref 26.0–34.0)
MCHC: 33.4 g/dL (ref 30.0–36.0)
MCV: 93 fL (ref 78.0–100.0)
MONO ABS: 0.5 10*3/uL (ref 0.1–1.0)
Monocytes Relative: 8 %
Neutro Abs: 5.3 10*3/uL (ref 1.7–7.7)
Neutrophils Relative %: 73 %
PLATELETS: 413 10*3/uL — AB (ref 150–400)
RBC: 4.86 MIL/uL (ref 4.22–5.81)
RDW: 12.7 % (ref 11.5–15.5)
WBC: 7.1 10*3/uL (ref 4.0–10.5)

## 2015-04-16 LAB — BASIC METABOLIC PANEL
ANION GAP: 5 (ref 5–15)
BUN: 9 mg/dL (ref 6–20)
CO2: 29 mmol/L (ref 22–32)
Calcium: 9.2 mg/dL (ref 8.9–10.3)
Chloride: 104 mmol/L (ref 101–111)
Creatinine, Ser: 0.93 mg/dL (ref 0.61–1.24)
GFR calc Af Amer: >60 mL/min (ref >60)
GFR calc non Af Amer: >60 mL/min (ref >60)
GLUCOSE: 87 mg/dL (ref 65–99)
POTASSIUM: 4.1 mmol/L (ref 3.5–5.1)
SODIUM: 138 mmol/L (ref 135–145)

## 2015-04-16 LAB — MAGNESIUM: MAGNESIUM: 2 mg/dL (ref 1.7–2.4)

## 2015-04-16 MED ORDER — ONDANSETRON HCL 4 MG/2ML IJ SOLN
4.0000 mg | Freq: Once | INTRAMUSCULAR | Status: AC
  Administered 2015-04-16: 4 mg via INTRAVENOUS
  Filled 2015-04-16: qty 2

## 2015-04-16 MED ORDER — SODIUM CHLORIDE 0.9 % IV BOLUS (SEPSIS)
1000.0000 mL | Freq: Once | INTRAVENOUS | Status: AC
  Administered 2015-04-16: 1000 mL via INTRAVENOUS

## 2015-04-16 MED ORDER — LOPERAMIDE HCL 2 MG PO CAPS: 2.0000 mg | ORAL_CAPSULE | Freq: Four times a day (QID) | ORAL | Status: DC | PRN

## 2015-04-16 MED ORDER — LOPERAMIDE HCL 2 MG PO CAPS
2.0000 mg | ORAL_CAPSULE | Freq: Once | ORAL | Status: AC
  Administered 2015-04-16: 2 mg via ORAL
  Filled 2015-04-16: qty 1

## 2015-04-16 MED ORDER — CLONIDINE HCL 0.2 MG PO TABS: 0.1000 mg | ORAL_TABLET | Freq: Two times a day (BID) | ORAL | Status: DC

## 2015-04-16 MED ORDER — ONDANSETRON HCL 4 MG PO TABS
4.0000 mg | ORAL_TABLET | Freq: Three times a day (TID) | ORAL | Status: DC | PRN
Start: 2015-04-16 — End: 2015-07-17

## 2015-04-16 NOTE — ED Notes (Addendum)
C/o "opiate withdrawal"-last used Saturday-c/o diarrhea, chills, cramps-NAD-steady gait-denies SI/HI

## 2015-04-16 NOTE — ED Provider Notes (Signed)
CSN: [id number]     Arrival date & time 04/16/15  1506 History   By signing my name below, I, Eustaquio Maize, attest that this documentation has been prepared under the direction and in the presence of [name], MD. Electronically Signed: Eustaquio Maize, ED Scribe. 04/16/2015. 6:01 PM.   Chief Complaint  Patient presents with  . Withdrawal   The history is provided by the patient. No language interpreter was used.     HPI Comments: Brett Beck is a 40 y.o. male who presents to the Emergency Department complaining of opiate withdrawal that began 3 days ago. History of opiate dependence and reports that he relapsed a few weeks ago. He mentions that he has been taking Roxicodone for the past couple of weeks and his last usage was 3 days ago, prior to his symptoms beginning. Pt reports chills, sweats, bilateral leg cramping, abdominal pain, diarrhea, and nausea. Pt has been trying to eat and drink as normally as he can. He has had similar opiate withdrawals in the past and reports that Clonidine works well for him. Pt is in the process of getting placement in a rehabilitation program but was told he needs to detox before he can be admitted. Pt denies SI or HI. Denies vomiting or any other associated symptoms.  Past Medical History  Diagnosis Date  . Degenerative disc disease   . Chronic back pain   . Pneumothorax on left   . Emphysema/COPD ([place])   . Opiate abuse, continuous    Past Surgical History  Procedure Laterality Date  . Lung surgery      Dr Arlyce Dice-- left lung  . Lung inflation      left lung  . Nose surgery      broken nose   Family History  Problem Relation Age of Onset  . Alpha-1 antitrypsin deficiency Mother   . Emphysema Mother   . Asthma Mother    Social History  Substance Use Topics  . Smoking status: Current Every Day Smoker -- 1.50 packs/day for 12 years    Types: Cigarettes  . Smokeless tobacco: Never Used  . Alcohol Use: No    Review of Systems   Constitutional: Positive for chills and diaphoresis.  Gastrointestinal: Positive for nausea, abdominal pain and diarrhea. Negative for vomiting.  Musculoskeletal:       + Bilateral leg cramping  All other systems reviewed and are negative.  Allergies  Effexor; Hydrocodone-acetaminophen; Ibuprofen; Lyrica; Neurontin; Nsaids; and Tramadol hcl  Home Medications   Prior to Admission medications   Medication Sig Start Date End Date Taking? Authorizing Provider  budesonide-formoterol (SYMBICORT) 80-4.5 MCG/ACT inhaler Inhale 2 puffs into the lungs 2 (two) times daily as needed (shortness of breathe.).    Historical Provider, MD  cloNIDine (CATAPRES) 0.2 MG tablet Take 0.5 tablets (0.1 mg total) by mouth 2 (two) times daily. Do not take if systolic blood pressure < 100 04/16/15   [name], MD  hydrOXYzine (ATARAX/VISTARIL) 25 MG tablet Take 1 tablet (25 mg total) by mouth every 6 (six) hours as needed for anxiety. 03/22/15   [name], FNP  loperamide (IMODIUM) 2 MG capsule Take 1 capsule (2 mg total) by mouth 4 (four) times daily as needed for diarrhea or loose stools. 04/16/15   [name], MD  ondansetron (ZOFRAN) 4 MG tablet Take 1 tablet (4 mg total) by mouth every 8 (eight) hours as needed for nausea or vomiting. 04/16/15   [name], MD  QUEtiapine (SEROQUEL) 50 MG tablet Take 1 tablet (50 mg total) by mouth at bedtime as needed. 03/22/15   Elyse Jarvis Withrow, FNP   BP 106/84 mmHg  Pulse 94  Temp(Src) 98.9 F (37.2 C) (Oral)  Resp 16  Ht 6\' 1"  (1.854 m)  Wt 145 lb (65.772 kg)  BMI 19.13 kg/m2  SpO2 97%   Physical Exam  Physical Exam  Nursing note and vitals reviewed. Constitutional: Well developed, well nourished, non-toxic, and in no acute distress Head: Normocephalic and atraumatic.  Mouth/Throat: Oropharynx is clear. Dry mucous membranes.  Neck: Normal range of motion. Neck supple.  Cardiovascular: Normal rate and regular rhythm.   Pulmonary/Chest: Effort normal and  breath sounds normal.  Abdominal: Soft. There is no tenderness. There is no rebound and no guarding.  Musculoskeletal: Normal range of motion.  Neurological: Alert, no facial droop, fluent speech, moves all extremities symmetrically Skin: Skin is warm and dry.  Psychiatric: Cooperative  ED Course  Procedures (including critical care time)  DIAGNOSTIC STUDIES: Oxygen Saturation is 99% on RA, normal by my interpretation.    COORDINATION OF CARE: 6:00 PM-Discussed treatment plan with pt at bedside and pt agreed to plan.   Labs Review Labs Reviewed  CBC WITH DIFFERENTIAL/PLATELET - Abnormal; Notable for the following:    Platelets 413 (*)    All other components within normal limits  BASIC METABOLIC PANEL  MAGNESIUM    Imaging Review No results found.    EKG Interpretation None      MDM   Final diagnoses:  Opiate withdrawal ([place])    I personally performed the services described in this documentation, which was scribed in my presence. The recorded information has been reviewed and is accurate.   40 year old male with previous history of chronic back pain with opioid dependence who presents with opiate withdrawal. Presentation is nontoxic and in no acute distress with stable vital signs. Appears mildly dry on exam. Remainder of exam unremarkable. No major electrolyte or metabolic derangements on blood work. Provided supportive care with IV fluids, antidiarrheal anti-emetics. No acute psychiatric concerns for safety. Appropriate for discharge home with supportive care measures. Discharged with loperamide, zofran, and clonidine. Strict return and follow-up instructions reviewed. He expressed understanding of all discharge instructions and felt comfortable with the plan of care.    [name], MD 04/16/15 [phone]

## 2015-04-16 NOTE — Discharge Instructions (Signed)
Return without fail for worsening symptoms, including passing out, vomiting unable fluids, thoughts of wanting to hurt yourself or others, or any other symptoms concerning to you.   Opioid Withdrawal Opioids are a group of narcotic drugs. They include the street drug heroin. They also include pain medicines, such as morphine, hydrocodone, oxycodone, and fentanyl. Opioid withdrawal is a group of characteristic physical and mental signs and symptoms. It typically occurs if you have been using opioids daily for several weeks or longer and stop using or rapidly decrease use. Opioid withdrawal can also occur if you have used opioids daily for a long time and are given a medicine to block the effect.  SIGNS AND SYMPTOMS Opioid withdrawal includes three or more of the following symptoms:   Depressed, anxious, or irritable mood.  Nausea or vomiting.  Muscle aches or spasms.   Watery eyes.   Runny nose.  Dilated pupils, sweating, or hairs standing on end.  Diarrhea or intestinal cramping.  Yawning.   Fever.  Increased blood pressure.  Fast pulse.  Restlessness or trouble sleeping. These signs and symptoms occur within several hours of stopping or reducing short-acting opioids, such as heroin. They can occur within 3 days of stopping or reducing long-acting opioids, such as methadone. Withdrawal begins within minutes of receiving a drug that blocks the effects of opioids, such as naltrexone or naloxone. DIAGNOSIS  Opioid use disorder is diagnosed by your health care provider. You will be asked about your symptoms, drug and alcohol use, medical history, and use of medicines. A physical exam may be done. Lab tests may be ordered. Your health care provider may have you see a mental health professional.  TREATMENT  The treatment for opioid withdrawal is usually provided by medical doctors with special training in substance use disorders (addiction specialists). The following medicines may be  included in treatment:  Opioids given in place of the abused opioid. They turn on opioid receptors in the brain and lessen or prevent withdrawal symptoms. They are gradually decreased (opioid substitution and taper).  Non-opioids that can lessen certain opioid withdrawal symptoms. They may be used alone or with opioid substitution and taper. Successful long-term recovery usually requires medicine, counseling, and group support. HOME CARE INSTRUCTIONS   Take medicines only as directed by your health care provider.  Check with your health care provider before starting new medicines.  Keep all follow-up visits as directed by your health care provider. SEEK MEDICAL CARE IF:  You are not able to take your medicines as directed.  Your symptoms get worse.  You relapse. SEEK IMMEDIATE MEDICAL CARE IF:  You have serious thoughts about hurting yourself or others.  You have a seizure.  You lose consciousness.   This information is not intended to replace advice given to you by your health care provider. Make sure you discuss any questions you have with your health care provider.   Document Released: 02/13/2003 Document Revised: 03/03/2014 Document Reviewed: 02/23/2013 Elsevier Interactive Patient Education [employer].

## 2015-04-16 NOTE — ED Notes (Signed)
Pt verbalizes understanding of d/c instructions and denies any further needs at this time. 

## 2015-06-03 ENCOUNTER — Encounter (HOSPITAL_BASED_OUTPATIENT_CLINIC_OR_DEPARTMENT_OTHER): Payer: Self-pay | Admitting: *Deleted

## 2015-06-03 ENCOUNTER — Emergency Department (HOSPITAL_BASED_OUTPATIENT_CLINIC_OR_DEPARTMENT_OTHER)
Admission: EM | Admit: 2015-06-03 | Discharge: 2015-06-03 | Disposition: A | Discharge status: Home / Self Care | Payer: 59 | Attending: Emergency Medicine | Admitting: Emergency Medicine

## 2015-06-03 DIAGNOSIS — F17210 Nicotine dependence, cigarettes, uncomplicated: Secondary | ICD-10-CM | POA: Insufficient documentation

## 2015-06-03 DIAGNOSIS — Z760 Encounter for issue of repeat prescription: Secondary | ICD-10-CM | POA: Insufficient documentation

## 2015-06-03 DIAGNOSIS — Z79899 Other long term (current) drug therapy: Secondary | ICD-10-CM | POA: Insufficient documentation

## 2015-06-03 DIAGNOSIS — F1123 Opioid dependence with withdrawal: Secondary | ICD-10-CM

## 2015-06-03 DIAGNOSIS — G8929 Other chronic pain: Secondary | ICD-10-CM | POA: Insufficient documentation

## 2015-06-03 DIAGNOSIS — R1030 Lower abdominal pain, unspecified: Secondary | ICD-10-CM | POA: Insufficient documentation

## 2015-06-03 DIAGNOSIS — F1193 Opioid use, unspecified with withdrawal: Secondary | ICD-10-CM

## 2015-06-03 DIAGNOSIS — Z8739 Personal history of other diseases of the musculoskeletal system and connective tissue: Secondary | ICD-10-CM | POA: Insufficient documentation

## 2015-06-03 DIAGNOSIS — J439 Emphysema, unspecified: Secondary | ICD-10-CM | POA: Insufficient documentation

## 2015-06-03 MED ORDER — CLONIDINE HCL 0.1 MG PO TABS: 0.1000 mg | ORAL_TABLET | Freq: Two times a day (BID) | ORAL | Status: DC

## 2015-06-03 MED ORDER — HYDROXYZINE HCL 25 MG PO TABS: 25.0000 mg | ORAL_TABLET | Freq: Four times a day (QID) | ORAL | Status: DC

## 2015-06-03 MED ORDER — QUETIAPINE FUMARATE 50 MG PO TABS: 50.0000 mg | ORAL_TABLET | Freq: Every day | ORAL | Status: DC

## 2015-06-03 NOTE — Discharge Instructions (Signed)
Chemical Dependency Chemical dependency is an addiction to drugs or alcohol. It is characterized by the repeated behavior of seeking out and using drugs and alcohol despite harmful consequences to the health and safety of ones self and others.  RISK FACTORS There are certain situations or behaviors that increase a person's risk for chemical dependency. These include:  A family history of chemical dependency.  A history of mental health issues, including depression and anxiety.  A home environment where drugs and alcohol are easily available to you.  Drug or alcohol use at a young age. SYMPTOMS  The following symptoms can indicate chemical dependency:  Inability to limit the use of drugs or alcohol.  Nausea, sweating, shakiness, and anxiety that occurs when alcohol or drugs are not being used.  An increase in amount of drugs or alcohol that is necessary to get drunk or high. People who experience these symptoms can assess their use of drugs and alcohol by asking themselves the following questions:  Have you been told by friends or family that they are worried about your use of alcohol or drugs?  Do friends and family ever tell you about things you did while drinking alcohol or using drugs that you do not remember?  Do you lie about using alcohol or drugs or about the amounts you use?  Do you have difficulty completing daily tasks unless you use alcohol or drugs?  Is the level of your work or school performance lower because of your drug or alcohol use?  Do you get sick from using drugs or alcohol but keep using anyway?  Do you feel uncomfortable in social situations unless you use alcohol or drugs?  Do you use drugs or alcohol to help forget problems? An answer of yes to any of these questions may indicate chemical dependency. Professional evaluation is suggested.   This information is not intended to replace advice given to you by your health care provider. Make sure you  discuss any questions you have with your health care provider.   Document Released: 02/04/2001 Document Revised: 05/05/2011 Document Reviewed: 04/18/2010 Elsevier Interactive Patient Education 2016 [employer].  Opioid Use Disorder Opioid use disorder is a mental disorder. It is the continued nonmedical use of opioids in spite of risks to health and well-being. Misused opioids include the street drug heroin. They also include pain medicines such as morphine, hydrocodone, oxycodone, and fentanyl. Opioids are very addictive. People who misuse opioids get an exaggerated feeling of well-being. Opioid use disorder often disrupts activities at home, work, or school. It may cause mental or physical problems.  A family history of opioid use disorder puts you at higher risk of it. People with opioid use disorder often misuse other drugs or have mental illness such as depression, posttraumatic stress disorder, or antisocial personality disorder. They also are at risk of suicide and death from overdose. SIGNS AND SYMPTOMS  Signs and symptoms of opioid use disorder include:  Use of opioids in larger amounts or over a longer period than intended.  Unsuccessful attempts to cut down or control opioid use.  A lot of time spent obtaining, using, or recovering from the effects of opioids.  A strong desire or urge to use opioids (craving).  Continued use of opioids in spite of major problems at work, school, or home because of use.  Continued use of opioids in spite of relationship problems because of use.  Giving up or cutting down on important life activities because of opioid use.  Use  of opioids over and over in situations when it is physically hazardous, such as driving a car.  Continued use of opioids in spite of a physical problem that is likely related to use. Physical problems can include:  Severe constipation.  Poor nutrition.  Infertility.  Tuberculosis.  Aspiration  pneumonia.  Infections such as human immunodeficiency virus (HIV) and hepatitis (from injecting opioids).  Continued use of opioids in spite of a mental problem that is likely related to use. Mental problems can include:  Depression.  Anxiety.  Hallucinations.  Sleep problems.  Loss of sexual function.  Need to use more and more opioids to get the same effect, or lessened effect over time with use of the same amount (tolerance).  Having withdrawal symptoms when opioid use is stopped, or using opioids to reduce or avoid withdrawal symptoms. Withdrawal symptoms include:  Depressed, anxious, or irritable mood.  Nausea, vomiting, diarrhea, or intestinal cramping.  Muscle aches or spasms.  Excessive tearing or runny nose.  Dilated pupils, sweating, or hairs standing on end.  Yawning.  Fever, raised blood pressure, or fast pulse.  Restlessness or trouble sleeping. This does not apply to people taking opioids for medical reasons only. DIAGNOSIS Opioid use disorder is diagnosed by your health care provider. You may be asked questions about your opioid use and and how it affects your life. A physical exam may be done. A drug screen may be ordered. You may be referred to a mental health professional. The diagnosis of opioid use disorder requires at least two symptoms within 12 months. The type of opioid use disorder you have depends on the number of signs and symptoms you have. The type may be:  Mild. Two or three signs and symptoms.   Moderate. Four or five signs and symptoms.   Severe. Six or more signs and symptoms. TREATMENT  Treatment is usually provided by mental health professionals with training in substance use disorders.The following options are available:  Detoxification.This is the first step in treatment for withdrawal. It is medically supervised withdrawal with the use of medicines. These medicines lessen withdrawal symptoms. They also raise the chance of  becoming opioid free.  Counseling, also known as talk therapy. Talk therapy addresses the reasons you use opioids. It also addresses ways to keep you from using again (relapse). The goals of talk therapy are to avoid relapse by:  Identifying and avoiding triggers for use.  Finding healthy ways to cope with stress.  Learning how to handle cravings.  Support groups. Support groups provide emotional support, advice, and guidance.  A medicine that blocks opioid receptors in your brain. This medicine can reduce opioid cravings that lead to relapse. This medicine also blocks the desired opioid effect when relapse occurs.  Opioids that are taken by mouth in place of the misused opioid (opioid maintenance treatment). These medicines satisfy cravings but are safer than commonly misused opioids. This often is the best option for people who continue to relapse with other treatments. HOME CARE INSTRUCTIONS   Take medicines only as directed by your health care provider.  Check with your health care provider before starting new medicines.  Keep all follow-up visits as directed by your health care provider. SEEK MEDICAL CARE IF:  You are not able to take your medicines as directed.  Your symptoms get worse. SEEK IMMEDIATE MEDICAL CARE IF:  You have serious thoughts about hurting yourself or others.  You may have taken an overdose of opioids. [place]  on Drug Abuse: motorcyclefax.com  Substance Abuse and Mental Health Services Administration: ktimeonline.com   This information is not intended to replace advice given to you by your health care provider. Make sure you discuss any questions you have with your health care provider.   Document Released: 12/08/2006 Document Revised: 03/03/2014 Document Reviewed: 02/23/2013 Elsevier Interactive Patient Education [employer].

## 2015-06-03 NOTE — ED Provider Notes (Signed)
CSN: [id number]     Arrival date & time 06/03/15  1934 History  By signing my name below, I, Brett Beck, attest that this documentation has been prepared under the direction and in the presence of [name], MD. Electronically Signed: [name], ED Scribe. 06/03/2015. 9:19 PM.   Chief Complaint  Patient presents with  . Withdrawal   The history is provided by the patient. No language interpreter was used.  HPI Comments: Brett Beck is a 40 y.o. male with a PMHx of opiate abuse who presents to the Emergency Department for refills of Clonidine, Vistaril, and Seroquel for treatment of withdrawal symptoms. He states that he last used oxycodone 2 days ago and that he has become diaphoretic in the following days. Patient reports diarrhea, tremors, intermittent chills and hot flashes, and restless legs. He has been in contact with [name] and has sponsor in [name]. He does not have a PCP currently. Denies nausea, vomiting, and fevers.  Past Medical History  Diagnosis Date  . Degenerative disc disease   . Chronic back pain   . Pneumothorax on left   . Emphysema/COPD ([place])   . Opiate abuse, continuous    Past Surgical History  Procedure Laterality Date  . Lung surgery      Dr Arlyce Dice-- left lung  . Lung inflation      left lung  . Nose surgery      broken nose   Family History  Problem Relation Age of Onset  . Alpha-1 antitrypsin deficiency Mother   . Emphysema Mother   . Asthma Mother    Social History  Substance Use Topics  . Smoking status: Current Every Day Smoker -- 1.50 packs/day for 12 years    Types: Cigarettes  . Smokeless tobacco: Never Used  . Alcohol Use: No    Review of Systems A complete 10 system review of systems was obtained and all systems are negative except as noted in the HPI and PMH.   Allergies  Effexor; Hydrocodone-acetaminophen; Ibuprofen; Lyrica; Neurontin; Nsaids; and Tramadol hcl  Home Medications    Prior to Admission medications   Medication Sig Start Date End Date Taking? Authorizing Provider  loperamide (IMODIUM) 2 MG capsule Take 1 capsule (2 mg total) by mouth 4 (four) times daily as needed for diarrhea or loose stools. 04/16/15  Yes [name], MD  budesonide-formoterol [facility]) 80-4.5 MCG/ACT inhaler Inhale 2 puffs into the lungs 2 (two) times daily as needed (shortness of breathe.).    Historical Provider, MD  cloNIDine (CATAPRES) 0.1 MG tablet Take 1 tablet (0.1 mg total) by mouth 2 (two) times daily. Do not take if your blood pressure is less than 110/60 or a heart rate is less than 60. 06/03/15   [name], MD  cloNIDine (CATAPRES) 0.2 MG tablet Take 0.5 tablets (0.1 mg total) by mouth 2 (two) times daily. Do not take if systolic blood pressure < 100 04/16/15   [name], MD  hydrOXYzine (ATARAX/VISTARIL) 25 MG tablet Take 1 tablet (25 mg total) by mouth every 6 (six) hours as needed for anxiety. 03/22/15   [name], FNP  hydrOXYzine (ATARAX/VISTARIL) 25 MG tablet Take 1 tablet (25 mg total) by mouth every 6 (six) hours. 06/03/15   [name], MD  ondansetron (ZOFRAN) 4 MG tablet Take 1 tablet (4 mg total) by mouth every 8 (eight) hours as needed for nausea or vomiting. 04/16/15   [name], MD  QUEtiapine (  SEROQUEL) 50 MG tablet Take 1 tablet (50 mg total) by mouth at bedtime as needed. 03/22/15   [name], FNP  QUEtiapine (SEROQUEL) 50 MG tablet Take 1 tablet (50 mg total) by mouth at bedtime. 06/03/15   [name], MD   BP 127/88 mmHg  Pulse 85  Temp(Src) 97.9 F (36.6 C) (Oral)  Resp 20  Ht 6\' 1"  (1.854 m)  Wt 150 lb (68.04 kg)  BMI 19.79 kg/m2  SpO2 99% Physical Exam  Constitutional: He is oriented to person, place, and time. He appears well-developed and well-nourished. No distress.  HENT:  Head: Normocephalic and atraumatic.  Eyes: Conjunctivae are normal.  Neck: Normal range of motion.  Cardiovascular: Normal rate, regular rhythm and  normal heart sounds.  Exam reveals no gallop and no friction rub.   No murmur heard. Pulmonary/Chest: Effort normal and breath sounds normal. No respiratory distress. He has no wheezes. He has no rhonchi. He has no rales.  Abdominal: Soft. There is no guarding.  Mild lower abdominal tenderness without guarding.  Musculoskeletal: Normal range of motion.   No peripheral edema. No Areas of abscess or track marks. No signs of IV drug abuse identified.  Neurological: He is alert and oriented to person, place, and time.  Skin: Skin is warm and dry.  Psychiatric: He has a normal mood and affect. His behavior is normal.    ED Course  Procedures   DIAGNOSTIC STUDIES: Oxygen Saturation is 99% on RA, normal by my interpretation.    COORDINATION OF CARE: 9:07 PM Will refill Seroquel, Vistaril, and Clonidine. Discussed treatment plan with pt at bedside and pt agreed to plan.   MDM   Final diagnoses:  Opioid withdrawal ([place])  Medication refill   Patient is nontoxic and alert. Vital signs are stable. He does not show signs of acute illness. Patient reports history of opioid withdrawal due to oxycodone use. Patient will be provided with clonidine as requested. He also reports typically taking Vistaril and Seroquel at bedtime. These 2 medications are refilled. Patient reports he knows resources and is using those 2 obtained drug rehabilitation treatment. He also reports he has a sponsor. Patient reports he will be finding a new family doctor as his is left the practice.   [name], MD 06/03/15 2122

## 2015-06-03 NOTE — ED Notes (Addendum)
Coming off of oxycodone and having withdrawal sx. Lists: sweats, diarrhea, chills, cramping stomach, achy legs. (Denies: nv, hallucinations, fever, recent illness). Last use Friday night. States, "I need clonidine". Has a sponsor and Tourist information centre manager. Mentions, [facility]. Denies ETOH or other substances. Smokes cigarettes. Alert, NAD, calm, cooperative, pleasant. Took last vistaril ~ 1 hr ago.

## 2015-06-03 NOTE — ED Notes (Signed)
Pt is requesting refills for Seroquel, Vistaril and Clonidine.

## 2015-07-07 ENCOUNTER — Emergency Department (HOSPITAL_BASED_OUTPATIENT_CLINIC_OR_DEPARTMENT_OTHER)
Admission: EM | Admit: 2015-07-07 | Discharge: 2015-07-07 | Disposition: A | Discharge status: Home / Self Care | Payer: Self-pay | Attending: Emergency Medicine | Admitting: Emergency Medicine

## 2015-07-07 ENCOUNTER — Encounter (HOSPITAL_BASED_OUTPATIENT_CLINIC_OR_DEPARTMENT_OTHER): Payer: Self-pay | Admitting: *Deleted

## 2015-07-07 DIAGNOSIS — F1193 Opioid use, unspecified with withdrawal: Secondary | ICD-10-CM | POA: Insufficient documentation

## 2015-07-07 DIAGNOSIS — Z7951 Long term (current) use of inhaled steroids: Secondary | ICD-10-CM | POA: Insufficient documentation

## 2015-07-07 DIAGNOSIS — F17210 Nicotine dependence, cigarettes, uncomplicated: Secondary | ICD-10-CM | POA: Insufficient documentation

## 2015-07-07 MED ORDER — HYDROXYZINE HCL 25 MG PO TABS: 25.0000 mg | ORAL_TABLET | Freq: Four times a day (QID) | ORAL | Status: DC | PRN

## 2015-07-07 MED ORDER — CLONIDINE HCL 0.1 MG PO TABS: 0.1000 mg | ORAL_TABLET | Freq: Two times a day (BID) | ORAL | Status: DC

## 2015-07-07 NOTE — ED Notes (Signed)
Appetite has been good per pt statement as well

## 2015-07-07 NOTE — ED Notes (Signed)
Pt reports withdrawal from oxycodone (was using 40mg  to 120mg  daily) last used 2 days ago. Withdrawal sx since last night: diarrhea, stomach cramps, chills, sweats, restless legs

## 2015-07-07 NOTE — ED Provider Notes (Signed)
CSN: [id number]     Arrival date & time 07/07/15  1231 History   First MD Initiated Contact with Patient 07/07/15 1302     Chief Complaint  Patient presents with  . Withdrawal     (Consider location/radiation/quality/duration/timing/severity/associated sxs/prior Treatment) HPI Comments: Patient presents with opioid withdrawal symptoms. He has a history of recurrent oxycodone abuse. He states he never does IV use or heroin. He only abuses oxycodone. He states that he started using again about 2 weeks ago after his mother died. He hasn't used in 2 days. He complains of generalized fatigue, nausea, leg cramps and sweats. The symptoms are consistent with his past opioid withdrawal. He has nausea but no vomiting. No fevers. No coughing or congestion. He's requesting medications for withdrawal symptoms. He does not have any suicidal ideations. He does not want inpatient treatment. He states that he is currently receiving outpatient substance abuse treatment and has a Social worker.   Past Medical History  Diagnosis Date  . Degenerative disc disease   . Chronic back pain   . Pneumothorax on left   . Emphysema/COPD ([place])   . Opiate abuse, continuous    Past Surgical History  Procedure Laterality Date  . Lung surgery      Dr Arlyce Dice-- left lung  . Lung inflation      left lung  . Nose surgery      broken nose   Family History  Problem Relation Age of Onset  . Alpha-1 antitrypsin deficiency Mother   . Emphysema Mother   . Asthma Mother    Social History  Substance Use Topics  . Smoking status: Current Every Day Smoker -- 1.50 packs/day for 12 years    Types: Cigarettes  . Smokeless tobacco: Never Used  . Alcohol Use: No    Review of Systems  Constitutional: Positive for chills, appetite change and fatigue. Negative for fever and diaphoresis.  HENT: Negative for congestion, rhinorrhea and sneezing.   Eyes: Negative.   Respiratory: Negative for cough, chest tightness and shortness of  breath.   Cardiovascular: Negative for chest pain and leg swelling.  Gastrointestinal: Positive for nausea. Negative for vomiting, abdominal pain, diarrhea and blood in stool.  Genitourinary: Negative for frequency, hematuria, flank pain and difficulty urinating.  Musculoskeletal: Positive for myalgias. Negative for back pain and arthralgias.  Skin: Negative for rash.  Neurological: Positive for light-headedness. Negative for dizziness, speech difficulty, weakness, numbness and headaches.      Allergies  Effexor; Hydrocodone-acetaminophen; Ibuprofen; Lyrica; Neurontin; Nsaids; and Tramadol hcl  Home Medications   Prior to Admission medications   Medication Sig Start Date End Date Taking? Authorizing Provider  budesonide-formoterol (SYMBICORT) 80-4.5 MCG/ACT inhaler Inhale 2 puffs into the lungs 2 (two) times daily as needed (shortness of breathe.).   Yes Historical Provider, MD  cloNIDine (CATAPRES) 0.1 MG tablet Take 1 tablet (0.1 mg total) by mouth 2 (two) times daily. Do not take if your blood pressure is less than 110/60 or a heart rate is less than 60. 07/07/15   Malvin Johns, MD  hydrOXYzine (ATARAX/VISTARIL) 25 MG tablet Take 1 tablet (25 mg total) by mouth every 6 (six) hours as needed for anxiety. 07/07/15   Malvin Johns, MD  loperamide (IMODIUM) 2 MG capsule Take 1 capsule (2 mg total) by mouth 4 (four) times daily as needed for diarrhea or loose stools. 04/16/15   [name], MD  ondansetron (ZOFRAN) 4 MG tablet Take 1 tablet (4 mg total) by mouth every 8 (eight)  hours as needed for nausea or vomiting. 04/16/15   [name], MD  QUEtiapine (SEROQUEL) 50 MG tablet Take 1 tablet (50 mg total) by mouth at bedtime as needed. 03/22/15   [name], FNP  QUEtiapine (SEROQUEL) 50 MG tablet Take 1 tablet (50 mg total) by mouth at bedtime. 06/03/15   [name], MD   BP 124/85 mmHg  Pulse 102  Temp(Src) 98.2 F (36.8 C) (Oral)  Resp 20  Ht 6\' 1"  (1.854 m)  Wt 140 lb (63.504  kg)  BMI 18.47 kg/m2  SpO2 99% Physical Exam  Constitutional: He is oriented to person, place, and time. He appears well-developed and well-nourished.  HENT:  Head: Normocephalic and atraumatic.  Eyes: Pupils are equal, round, and reactive to light.  Neck: Normal range of motion. Neck supple.  Cardiovascular: Normal rate, regular rhythm and normal heart sounds.   Pulmonary/Chest: Effort normal and breath sounds normal. No respiratory distress. He has no wheezes. He has no rales. He exhibits no tenderness.  Abdominal: Soft. Bowel sounds are normal. There is no tenderness. There is no rebound and no guarding.  Musculoskeletal: Normal range of motion. He exhibits no edema.  Lymphadenopathy:    He has no cervical adenopathy.  Neurological: He is alert and oriented to person, place, and time.  Skin: Skin is warm and dry. No rash noted.  Psychiatric: He has a normal mood and affect.    ED Course  Procedures (including critical care time) Labs Review Labs Reviewed - No data to display  Imaging Review No results found. I have personally reviewed and evaluated these images and lab results as part of my medical decision-making.   EKG Interpretation None      MDM   Final diagnoses:  Opioid use with withdrawal [facility])    Patient is given prescription for clonidine and Atarax for his anxiety/sleep. He was discharged home in good condition. He will follow-up with his counselor.    Malvin Johns, MD 07/07/15 [phone]

## 2015-07-07 NOTE — ED Notes (Signed)
Pt states recently his mother passed away. Has had problems with opioid abuse in the past, states originated with back pain. Abuses oxycodone orally. Denies snorting or IV use. States would take from 40-120mg  daily. Denies mixing any alcohol with drug use. Last dose was this past Thursday, mid day.

## 2015-07-07 NOTE — ED Notes (Signed)
DC instructions reviewed with pt, also discussed each of the two medications prescribed by EDP, discussed and ensured pt able to assess own pulse rate and NBP at home prior to taking antihypertensive, pt able to return demonstration. Discussed safety while taking other medication prescribed. Opportunity for questions provided

## 2015-07-07 NOTE — ED Notes (Signed)
Having stomach cramps, diarrhea, sweats, agitated at times. Not sleeping well, having restless leg syndrome. Denies any hallucinations, some confusion with time of day.

## 2015-07-07 NOTE — Discharge Instructions (Signed)
Opioid Use Disorder °Opioid use disorder is a mental disorder. It is the continued nonmedical use of opioids in spite of risks to health and well-being. Misused opioids include the street drug heroin. They also include pain medicines such as morphine, hydrocodone, oxycodone, and fentanyl. Opioids are very addictive. People who misuse opioids get an exaggerated feeling of well-being. Opioid use disorder often disrupts activities at home, work, or school. It may cause mental or physical problems.  °A family history of opioid use disorder puts you at higher risk of it. People with opioid use disorder often misuse other drugs or have mental illness such as depression, posttraumatic stress disorder, or antisocial personality disorder. They also are at risk of suicide and death from overdose. °SIGNS AND SYMPTOMS  °Signs and symptoms of opioid use disorder include: °· Use of opioids in larger amounts or over a longer period than intended. °· Unsuccessful attempts to cut down or control opioid use. °· A lot of time spent obtaining, using, or recovering from the effects of opioids. °· A strong desire or urge to use opioids (craving). °· Continued use of opioids in spite of major problems at work, school, or home because of use. °· Continued use of opioids in spite of relationship problems because of use. °· Giving up or cutting down on important life activities because of opioid use. °· Use of opioids over and over in situations when it is physically hazardous, such as driving a car. °· Continued use of opioids in spite of a physical problem that is likely related to use. Physical problems can include: °¨ Severe constipation. °¨ Poor nutrition. °¨ Infertility. °¨ Tuberculosis. °¨ Aspiration pneumonia. °¨ Infections such as human immunodeficiency virus (HIV) and hepatitis (from injecting opioids). °· Continued use of opioids in spite of a mental problem that is likely related to use. Mental problems can  include: °¨ Depression. °¨ Anxiety. °¨ Hallucinations. °¨ Sleep problems. °¨ Loss of sexual function. °· Need to use more and more opioids to get the same effect, or lessened effect over time with use of the same amount (tolerance). °· Having withdrawal symptoms when opioid use is stopped, or using opioids to reduce or avoid withdrawal symptoms. Withdrawal symptoms include: °¨ Depressed, anxious, or irritable mood. °¨ Nausea, vomiting, diarrhea, or intestinal cramping. °¨ Muscle aches or spasms. °¨ Excessive tearing or runny nose. °¨ Dilated pupils, sweating, or hairs standing on end. °¨ Yawning. °¨ Fever, raised blood pressure, or fast pulse. °¨ Restlessness or trouble sleeping. This does not apply to people taking opioids for medical reasons only. °DIAGNOSIS °Opioid use disorder is diagnosed by your health care provider. You may be asked questions about your opioid use and and how it affects your life. A physical exam may be done. A drug screen may be ordered. You may be referred to a mental health professional. The diagnosis of opioid use disorder requires at least two symptoms within 12 months. The type of opioid use disorder you have depends on the number of signs and symptoms you have. The type may be: °· Mild. Two or three signs and symptoms.    °· Moderate. Four or five signs and symptoms.   °· Severe. Six or more signs and symptoms. °TREATMENT  °Treatment is usually provided by mental health professionals with training in substance use disorders. The following options are available: °· Detoxification. This is the first step in treatment for withdrawal. It is medically supervised withdrawal with the use of medicines. These medicines lessen withdrawal symptoms. They also raise the chance   of becoming opioid free.  Counseling, also known as talk therapy. Talk therapy addresses the reasons you use opioids. It also addresses ways to keep you from using again (relapse). The goals of talk therapy are to avoid  relapse by:  Identifying and avoiding triggers for use.  Finding healthy ways to cope with stress.  Learning how to handle cravings.  Support groups. Support groups provide emotional support, advice, and guidance.  A medicine that blocks opioid receptors in your brain. This medicine can reduce opioid cravings that lead to relapse. This medicine also blocks the desired opioid effect when relapse occurs.  Opioids that are taken by mouth in place of the misused opioid (opioid maintenance treatment). These medicines satisfy cravings but are safer than commonly misused opioids. This often is the best option for people who continue to relapse with other treatments. HOME CARE INSTRUCTIONS   Take medicines only as directed by your health care provider.  Check with your health care provider before starting new medicines.  Keep all follow-up visits as directed by your health care provider. SEEK MEDICAL CARE IF:  You are not able to take your medicines as directed.  Your symptoms get worse. SEEK IMMEDIATE MEDICAL CARE IF:  You have serious thoughts about hurting yourself or others.  You may have taken an overdose of opioids. [place] on Drug Abuse: motorcyclefax.com  Substance Abuse and Mental Health Services Administration: ktimeonline.com   This information is not intended to replace advice given to you by your health care provider. Make sure you discuss any questions you have with your health care provider.   Document Released: 12/08/2006 Document Revised: 03/03/2014 Document Reviewed: 02/23/2013 Elsevier Interactive Patient Education [employer].

## 2015-07-16 ENCOUNTER — Encounter (HOSPITAL_COMMUNITY): Payer: Self-pay | Admitting: Emergency Medicine

## 2015-07-16 ENCOUNTER — Emergency Department (HOSPITAL_COMMUNITY)
Admission: EM | Admit: 2015-07-16 | Discharge: 2015-07-17 | Disposition: A | Discharge status: Home / Self Care | Payer: Self-pay | Attending: Emergency Medicine | Admitting: Emergency Medicine

## 2015-07-16 DIAGNOSIS — F419 Anxiety disorder, unspecified: Secondary | ICD-10-CM

## 2015-07-16 DIAGNOSIS — F411 Generalized anxiety disorder: Secondary | ICD-10-CM | POA: Diagnosis present

## 2015-07-16 DIAGNOSIS — F1994 Other psychoactive substance use, unspecified with psychoactive substance-induced mood disorder: Secondary | ICD-10-CM | POA: Insufficient documentation

## 2015-07-16 DIAGNOSIS — F418 Other specified anxiety disorders: Secondary | ICD-10-CM | POA: Insufficient documentation

## 2015-07-16 DIAGNOSIS — Z79899 Other long term (current) drug therapy: Secondary | ICD-10-CM | POA: Insufficient documentation

## 2015-07-16 DIAGNOSIS — F17210 Nicotine dependence, cigarettes, uncomplicated: Secondary | ICD-10-CM | POA: Insufficient documentation

## 2015-07-16 DIAGNOSIS — R000 Tachycardia, unspecified: Secondary | ICD-10-CM | POA: Insufficient documentation

## 2015-07-16 LAB — CBC
HCT: 45.5 % (ref 39.0–52.0)
Hemoglobin: 15.9 g/dL (ref 13.0–17.0)
MCH: 32.3 pg (ref 26.0–34.0)
MCHC: 34.9 g/dL (ref 30.0–36.0)
MCV: 92.3 fL (ref 78.0–100.0)
PLATELETS: 407 10*3/uL — AB (ref 150–400)
RBC: 4.93 MIL/uL (ref 4.22–5.81)
RDW: 12.9 % (ref 11.5–15.5)
WBC: 9.5 10*3/uL (ref 4.0–10.5)

## 2015-07-16 LAB — COMPREHENSIVE METABOLIC PANEL
ALK PHOS: 82 U/L (ref 38–126)
ALT: 12 U/L — AB (ref 17–63)
AST: 15 U/L (ref 15–41)
Albumin: 4.5 g/dL (ref 3.5–5.0)
Anion gap: 7 (ref 5–15)
BILIRUBIN TOTAL: 0.9 mg/dL (ref 0.3–1.2)
BUN: 8 mg/dL (ref 6–20)
CALCIUM: 9.2 mg/dL (ref 8.9–10.3)
CHLORIDE: 107 mmol/L (ref 101–111)
CO2: 27 mmol/L (ref 22–32)
CREATININE: 0.99 mg/dL (ref 0.61–1.24)
Glucose, Bld: 112 mg/dL — ABNORMAL HIGH (ref 65–99)
Potassium: 3.7 mmol/L (ref 3.5–5.1)
Sodium: 141 mmol/L (ref 135–145)
TOTAL PROTEIN: 7.2 g/dL (ref 6.5–8.1)

## 2015-07-16 LAB — RAPID URINE DRUG SCREEN, HOSP PERFORMED
AMPHETAMINES: NOT DETECTED
Barbiturates: NOT DETECTED
Benzodiazepines: NOT DETECTED
Cocaine: NOT DETECTED
Opiates: NOT DETECTED
TETRAHYDROCANNABINOL: NOT DETECTED

## 2015-07-16 LAB — ETHANOL

## 2015-07-16 LAB — SALICYLATE LEVEL

## 2015-07-16 LAB — ACETAMINOPHEN LEVEL: Acetaminophen (Tylenol), Serum: <10 ug/mL — ABNORMAL LOW (ref 10–30)

## 2015-07-16 MED ORDER — LORAZEPAM 1 MG PO TABS
1.0000 mg | ORAL_TABLET | Freq: Three times a day (TID) | ORAL | Status: DC | PRN
  Administered 2015-07-16: 1 mg via ORAL
  Filled 2015-07-16: qty 1

## 2015-07-16 MED ORDER — LORAZEPAM 1 MG PO TABS
1.0000 mg | ORAL_TABLET | Freq: Once | ORAL | Status: AC
  Administered 2015-07-16: 1 mg via ORAL
  Filled 2015-07-16: qty 1

## 2015-07-16 MED ORDER — NICOTINE 21 MG/24HR TD PT24
21.0000 mg | MEDICATED_PATCH | Freq: Once | TRANSDERMAL | Status: DC
  Administered 2015-07-16: 21 mg via TRANSDERMAL
  Filled 2015-07-16: qty 1

## 2015-07-16 NOTE — ED Notes (Signed)
Pt received calm and resting in bed. Patient denies pain, SI, A/ V H, and is asking for his night time medications early. Patient informed of q 15 min rounds and camera use. Patient contracted for safety and has no complaints at this time.

## 2015-07-16 NOTE — ED Provider Notes (Addendum)
CSN: [id number]     Arrival date & time 07/16/15  1524 History   First MD Initiated Contact with Patient 07/16/15 1645     Chief Complaint  Patient presents with  . Suicidal      HPI Patient presents with anxiety and his depression/suicidal thoughts. States he has been having issues with it since his mother died 2 weeks ago. Reviewing records it looks like he was in the ER around 1 week ago or he also said his mother died 2 weeks ago. He has had suicidal thoughts so he will not elaborate on what they were. Denies other substance abuse. States he has had Klonopin for years it has helped. States he has been worse recently since his mother died.   Past Medical History  Diagnosis Date  . Degenerative disc disease   . Chronic back pain   . Pneumothorax on left   . Emphysema/COPD ([place])   . Opiate abuse, continuous    Past Surgical History  Procedure Laterality Date  . Lung surgery      Dr Arlyce Dice-- left lung  . Lung inflation      left lung  . Nose surgery      broken nose   Family History  Problem Relation Age of Onset  . Alpha-1 antitrypsin deficiency Mother   . Emphysema Mother   . Asthma Mother    Social History  Substance Use Topics  . Smoking status: Current Every Day Smoker -- 1.50 packs/day for 12 years    Types: Cigarettes  . Smokeless tobacco: Never Used  . Alcohol Use: No    Review of Systems  Constitutional: Negative for activity change and appetite change.  Eyes: Negative for pain.  Respiratory: Negative for chest tightness and shortness of breath.   Cardiovascular: Negative for chest pain and leg swelling.  Gastrointestinal: Negative for nausea, abdominal pain and diarrhea.  Genitourinary: Negative for flank pain.  Musculoskeletal: Negative for back pain and neck stiffness.  Skin: Negative for rash.  Neurological: Negative for weakness, numbness and headaches.  Psychiatric/Behavioral: Positive for suicidal ideas. Negative for behavioral problems and  confusion. The patient is nervous/anxious.       Allergies  Effexor; Hydrocodone-acetaminophen; Ibuprofen; Lyrica; Neurontin; Nsaids; and Tramadol hcl  Home Medications   Prior to Admission medications   Medication Sig Start Date End Date Taking? Authorizing Provider  acetaminophen (TYLENOL) 500 MG tablet Take 1,000 mg by mouth every 6 (six) hours as needed for mild pain or moderate pain.   Yes Historical Provider, MD  budesonide-formoterol (SYMBICORT) 80-4.5 MCG/ACT inhaler Inhale 2 puffs into the lungs 2 (two) times daily as needed (shortness of breathe.).   Yes Historical Provider, MD  hydrOXYzine (ATARAX/VISTARIL) 25 MG tablet Take 1 tablet (25 mg total) by mouth every 6 (six) hours as needed for anxiety. 07/07/15  Yes Malvin Johns, MD  loperamide (IMODIUM) 2 MG capsule Take 1 capsule (2 mg total) by mouth 4 (four) times daily as needed for diarrhea or loose stools. Patient taking differently: Take 4 mg by mouth 4 (four) times daily as needed for diarrhea or loose stools.  04/16/15  Yes [name], MD  cloNIDine (CATAPRES) 0.1 MG tablet Take 1 tablet (0.1 mg total) by mouth 2 (two) times daily. Do not take if your blood pressure is less than 110/60 or a heart rate is less than 60. Patient not taking: Reported on 07/16/2015 07/07/15   Malvin Johns, MD  ondansetron (ZOFRAN) 4 MG tablet Take 1 tablet (4  mg total) by mouth every 8 (eight) hours as needed for nausea or vomiting. Patient not taking: Reported on 07/16/2015 04/16/15   [name], MD  QUEtiapine (SEROQUEL) 50 MG tablet Take 1 tablet (50 mg total) by mouth at bedtime as needed. Patient not taking: Reported on 07/16/2015 03/22/15   [name], FNP  QUEtiapine (SEROQUEL) 50 MG tablet Take 1 tablet (50 mg total) by mouth at bedtime. Patient not taking: Reported on 07/16/2015 06/03/15   [name], MD   BP 108/96 mmHg  Pulse 106  Temp(Src) 98.3 F (36.8 C) (Oral)  Resp 19  SpO2 100% Physical Exam  Constitutional: He appears  well-developed.  HENT:  Head: Atraumatic.  Eyes: EOM are normal.  Neck: Neck supple.  Cardiovascular:  Mild tachycardia  Pulmonary/Chest: Effort normal.  Abdominal: Soft. There is no tenderness.  Musculoskeletal: Normal range of motion. He exhibits no tenderness.  Neurological: He is alert.  Psychiatric:  Patient is nervous and somewhat tremulous.    ED Course  Procedures (including critical care time) Labs Review Labs Reviewed  COMPREHENSIVE METABOLIC PANEL - Abnormal; Notable for the following:    Glucose, Bld 112 (*)    ALT 12 (*)    All other components within normal limits  ACETAMINOPHEN LEVEL - Abnormal; Notable for the following:    Acetaminophen (Tylenol), Serum <10 (*)    All other components within normal limits  CBC - Abnormal; Notable for the following:    Platelets 407 (*)    All other components within normal limits  ETHANOL  SALICYLATE LEVEL  URINE RAPID DRUG SCREEN, HOSP PERFORMED    Imaging Review No results found. I have personally reviewed and evaluated these images and lab results as part of my medical decision-making.   EKG Interpretation None      MDM   Final diagnoses:  Substance induced mood disorder ([place])  Anxiety    Patient with anxiety and suicidal thoughts. Patient is not very forthcoming about his suicidal thoughts or plan. At this point is voluntary but I would not allow him to leave. He is willing to stay after getting a little Ativan. Appears medically cleared and will be seen by TTS.    Davonna Belling, MD 07/16/15 1737  Inpatient treatment recommended.  Davonna Belling, MD 07/16/15 2237

## 2015-07-16 NOTE — ED Notes (Signed)
Pt signed consent for his father to receive information. His father plans to be here tomorrow when the doctors round. Pt said that what he came for was to get something for anxiety. His mother died last week and he is having a hard time dealing with her death.

## 2015-07-16 NOTE — ED Notes (Signed)
Patient c/o severe anxiety and Si with plan but won't share with scriber.  Patient states that he doesn't own firearms.  Patient states symptoms have gotten worse over past 48 hours.  Patient states mother recently died.  Pt denies any illegal drug use.

## 2015-07-16 NOTE — ED Notes (Signed)
Pt admitted to the [facility] for safety because he has thoughts of killing himself, but is not sharing a plan. His affect is blunted and he is depressed appearing and guarded. Pt given a dinner tray.

## 2015-07-17 DIAGNOSIS — F419 Anxiety disorder, unspecified: Secondary | ICD-10-CM | POA: Insufficient documentation

## 2015-07-17 DIAGNOSIS — F411 Generalized anxiety disorder: Secondary | ICD-10-CM

## 2015-07-17 MED ORDER — TRAZODONE HCL 50 MG PO TABS
50.0000 mg | ORAL_TABLET | Freq: Every evening | ORAL | Status: DC | PRN
  Administered 2015-07-17: 50 mg via ORAL
  Filled 2015-07-17: qty 1

## 2015-07-17 MED ORDER — LOPERAMIDE HCL 2 MG PO CAPS
4.0000 mg | ORAL_CAPSULE | Freq: Once | ORAL | Status: AC
  Administered 2015-07-17: 4 mg via ORAL
  Filled 2015-07-17: qty 2

## 2015-07-17 NOTE — [facility] Suicide Risk Assessment (Cosign Needed)
Suicide Risk Assessment  Discharge Assessment   [facility] Discharge Suicide Risk Assessment   Principal Problem: GAD (generalized anxiety disorder) Discharge Diagnoses:  Patient Active Problem List   Diagnosis Date Noted  . GAD (generalized anxiety disorder) [F41.1]     Priority: High  . Opiate withdrawal ([place]) [F11.23] 03/21/2015  . Substance induced mood disorder ([place]) [F19.94] 03/20/2015  . Opiate abuse, continuous [F11.10] 01/07/2015  . 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 MDD (major depressive disorder), recurrent episode, severe ([place]) [F33.2] 01/06/2015  . 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 Atypical chest pain [R07.89] 11/02/2012  . ANXIETY [F41.1] 08/01/2009  . DEPRESSION [F32.9] 08/01/2009  . ALLERGIC RHINITIS [J30.9] 08/01/2009  . PAIN IN JOINT, MULTIPLE SITES [M25.50] 08/01/2009  . BACK PAIN, LUMBAR, CHRONIC [M54.5] 08/01/2009  . HEADACHE [R51] 08/01/2009  . DIARRHEA, CHRONIC [R19.7] 08/01/2009    Total Time spent with patient: 20 minutes  Musculoskeletal: Strength & Muscle Tone: within normal limits Gait & Station: normal Patient leans: N/A  Psychiatric Specialty Exam:   Blood pressure 119/86, pulse 97, temperature 98.5 F (36.9 C), temperature source Oral, resp. rate 20, SpO2 98 %.There is no weight on file to calculate BMI.  General Appearance: Casual  Eye Contact: Good  Speech: Clear and Coherent and Normal Rate  Volume: Normal  Mood: Anxious  Affect: Congruent  Thought Process: Coherent and Goal Directed  Orientation: Full (Time, Place, and Person)  Thought Content: Logical  Suicidal Thoughts: No  Homicidal Thoughts: No  Memory: Immediate; Good Recent; Good Remote; Good  Judgement: Good  Insight: Good  Psychomotor Activity: Normal  Concentration: Concentration: Good  Recall: Good  Fund of  Knowledge: Good  Language: Good  Akathisia: No  Handed: Right  AIMS (if indicated):    Assets: Desire for Improvement  ADL's: Intact  Cognition: WNL         Mental Status Per Nursing Assessment::   On Admission:     Demographic Factors:  Male, Caucasian, Low socioeconomic status and Unemployed  Loss Factors: NA  Historical Factors: NA  Risk Reduction Factors:   Sense of responsibility to family and Living with another person, especially a relative  Continued Clinical Symptoms:  Severe Anxiety and/or Agitation  Cognitive Features That Contribute To Risk:  Polarized thinking    Suicide Risk:  Minimal: No identifiable suicidal ideation.  Patients presenting with no risk factors but with morbid ruminations; may be classified as minimal risk based on the severity of the depressive symptoms    Plan Of Care/Follow-up recommendations:  Activity:  As tolerated Diet:  Regular  Delfin Gant, NP   PMHNP-BC 07/17/2015, 11:16 AM

## 2015-07-17 NOTE — BH Assessment (Signed)
Tele Assessment Note   Brett Beck is an 40 y.o. male presenting to [facility] reporting increasing anxiety. Pt stated "I had a bout of anxiety". PT denies SI at this time but when he initially presented to the ED pt reported having suicidal ideations.  Pt reported that he was joking when he informed the doctor that if he had to wait any longer the would hang himself with the wires in the room. Pt did not report any previous suicide attempts or self-injurious behaviors. Pt shared a family history of suicide and reported that his uncle committed suicide. Pt denies HI and AVH at this time. PT reported that he is dealing with multiple stressors and stated "just life in general and my mother's death". Pt denied any drug or alcohol use. Pt did not report any current mental health treatment or psychiatric hospitalizations. Pt did not report any physical, sexual or emotional abuse at this time. Pt meets inpatient criteria.   Diagnosis: Major Depressive Disorder, Single episode, Moderate; Generalized anxiety disorder   Past Medical History:  Past Medical History  Diagnosis Date  . Degenerative disc disease   . Chronic back pain   . Pneumothorax on left   . Emphysema/COPD ([place])   . Opiate abuse, continuous     Past Surgical History  Procedure Laterality Date  . Lung surgery      Dr Arlyce Dice-- left lung  . Lung inflation      left lung  . Nose surgery      broken nose    Family History:  Family History  Problem Relation Age of Onset  . Alpha-1 antitrypsin deficiency Mother   . Emphysema Mother   . Asthma Mother     Social History:  reports that he has been smoking Cigarettes.  He has a 18 pack-year smoking history. He has never used smokeless tobacco. He reports that he uses illicit drugs (Oxycodone). He reports that he does not drink alcohol.  Additional Social History:     CIWA: CIWA-Ar BP: 97/74 mmHg Pulse Rate: 92 COWS:    PATIENT STRENGTHS: (choose at least two) Average or above  average intelligence Communication skills  Allergies:  Allergies  Allergen Reactions  . Effexor [Venlafaxine] Diarrhea  . Hydrocodone-Acetaminophen     REACTION: severe headaches  . Ibuprofen Other (See Comments)    Severe stomach issues  . Lyrica [Pregabalin] Diarrhea    Swelling in the feet  . Neurontin [Gabapentin] Other (See Comments)    Severe stomach issues  . Nsaids Other (See Comments)    Severe stomach issues  . Tramadol Hcl     REACTION: diarrhea, abdominal pain    Home Medications:  (Not in a hospital admission)  OB/GYN Status:  No LMP for male patient.  General Assessment Data Location of Assessment: [facility] ED TTS Assessment: In system Is this a Tele or Face-to-Face Assessment?: Face-to-Face Is this an Initial Assessment or a Re-assessment for this encounter?: Initial Assessment Marital status: Single Living Arrangements: Parent Can pt return to current living arrangement?: Yes Admission Status: Voluntary Is patient capable of signing voluntary admission?: Yes Referral Source: Self/Family/Friend Insurance type: None      Crisis Care Plan Living Arrangements: Parent Name of Psychiatrist: None  Name of Therapist: None   Education Status Is patient currently in school?: No  Risk to self with the past 6 months Suicidal Ideation: Yes-Currently Present Has patient been a risk to self within the past 6 months prior to admission? : No Suicidal Intent:  No-Not Currently/Within Last 6 Months Has patient had any suicidal intent within the past 6 months prior to admission? : No Is patient at risk for suicide?: Yes Suicidal Plan?: No-Not Currently/Within Last 6 Months Has patient had any suicidal plan within the past 6 months prior to admission? : No Access to Means: No What has been your use of drugs/alcohol within the last 12 months?: Pt denies  Previous Attempts/Gestures: No How many times?: 0 Other Self Harm Risks: Pt denies  Triggers for Past Attempts:  None known (No previous attempts reported.) Intentional Self Injurious Behavior: None Family Suicide History: Yes (Uncle ) Recent stressful life event(s): Loss (Comment) (Death of mother, "life") Persecutory voices/beliefs?: No Depression: Yes Depression Symptoms: Despondent, Insomnia, Fatigue, Tearfulness, Feeling angry/irritable, Feeling worthless/self pity Substance abuse history and/or treatment for substance abuse?: Yes Suicide prevention information given to non-admitted patients: Not applicable  Risk to Others within the past 6 months Homicidal Ideation: No Does patient have any lifetime risk of violence toward others beyond the six months prior to admission? : No Thoughts of Harm to Others: No Current Homicidal Intent: No Current Homicidal Plan: No Access to Homicidal Means: No Identified Victim: N/A History of harm to others?: No Assessment of Violence: None Noted Violent Behavior Description: No violent behaviors observed. Pt is calm and cooperative at this time.  Does patient have access to weapons?: No Criminal Charges Pending?: No Does patient have a court date: No Is patient on probation?: No  Psychosis Hallucinations: None noted Delusions: None noted  Mental Status Report Appearance/Hygiene: In scrubs Eye Contact: Good Motor Activity: Freedom of movement Speech: Logical/coherent Level of Consciousness: Quiet/awake Mood: Euthymic Affect: Appropriate to circumstance Anxiety Level: Minimal Thought Processes: Coherent, Relevant Judgement: Partial Orientation: Person, Situation, Place, Time, Appropriate for developmental age Obsessive Compulsive Thoughts/Behaviors: None  Cognitive Functioning Concentration: Normal Memory: Recent Intact, Remote Intact IQ: Average Insight: Fair Impulse Control: Fair Appetite: Poor Weight Loss: 5 Weight Gain: 0 Sleep: Decreased Total Hours of Sleep: 4 Vegetative Symptoms: None  ADLScreening [facility] Assessment  Services) Patient's cognitive ability adequate to safely complete daily activities?: Yes Patient able to express need for assistance with ADLs?: Yes Independently performs ADLs?: Yes (appropriate for developmental age)  Prior Inpatient Therapy Prior Inpatient Therapy: No  Prior Outpatient Therapy Prior Outpatient Therapy: Yes Prior Therapy Dates: Pt unable to recall dates Prior Therapy Facilty/Provider(s): [facility]  Reason for Treatment: anxiety  Does patient have an ACCT team?: No Does patient have Intensive In-House Services?  : No Does patient have [facility] services? : No Does patient have P4CC services?: No  ADL Screening (condition at time of admission) Patient's cognitive ability adequate to safely complete daily activities?: Yes Patient able to express need for assistance with ADLs?: Yes Independently performs ADLs?: Yes (appropriate for developmental age)             Regulatory affairs officer (For Healthcare) Does patient have an advance directive?: No Would patient like information on creating an advanced directive?: No - patient declined information    Additional Information 1:1 In Past 12 Months?: No CIRT Risk: No Elopement Risk: No Does patient have medical clearance?: Yes     Disposition:  Disposition Initial Assessment Completed for this Encounter: Yes Disposition of Patient: Inpatient treatment program Type of inpatient treatment program: Adult  Renesmee Raine S 07/17/2015 12:55 AM

## 2015-07-17 NOTE — BH Assessment (Signed)
[place] Assessment Progress Note  Per Donnelly Angelica, MD, this pt does not require psychiatric hospitalization at this time.  Pt is to be discharged from [facility] with recommendation to follow up with [facility] in [facility].  This recommendation has been included in pt's discharge instructions.  Pt's nurse, Diane, has been notified.  Jalene Mullet, [place] Triage Specialist [phone]

## 2015-07-17 NOTE — Discharge Instructions (Signed)
For your ongoing behavioral health needs, you are advised to follow up with [facility].  Contact them at your earliest opportunity to ask about scheduling an intake appointment:       [address]      [place], [place] [id number]       [phone]

## 2015-07-17 NOTE — Consult Note (Signed)
[facility] Consult   Reason for Consult:  Anxiety Referring Physician:  EDP Patient Identification: Brett Beck MRN:  [id number] Principal Diagnosis: GAD (generalized anxiety disorder) Diagnosis:   Patient Active Problem List   Diagnosis Date Noted  . GAD (generalized anxiety disorder) [F41.1]     Priority: High  . Opiate withdrawal ([place]) [F11.23] 03/21/2015  . Substance induced mood disorder ([place]) [F19.94] 03/20/2015  . Opiate abuse, continuous [F11.10] 01/07/2015  . 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 MDD (major depressive disorder), recurrent episode, severe ([place]) [F33.2] 01/06/2015  . Bullous emphysema ([place]) [J43.9] 11/30/2012  . 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 Atypical chest pain [R07.89] 11/02/2012  . ANXIETY [F41.1] 08/01/2009  . DEPRESSION [F32.9] 08/01/2009  . ALLERGIC RHINITIS [J30.9] 08/01/2009  . PAIN IN JOINT, MULTIPLE SITES [M25.50] 08/01/2009  . BACK PAIN, LUMBAR, CHRONIC [M54.5] 08/01/2009  . HEADACHE [R51] 08/01/2009  . DIARRHEA, CHRONIC [R19.7] 08/01/2009    Total Time spent with patient: 45 minutes  Subjective:   Brett Beck is a 40 y.o. male patient admitted with Anxiety.  HPI:  Caucasian male, 40 years old was evaluated for feeling anxious.  He reports he has suffered from anxiety for most of his life and have been prescribed Klonopin.  Patient reports that his anxiety has gotten worse after his mother died two weeks ago.  Patient admits that he has not gotten any counseling since his mother's death.  He denies SI/HI/AVH and she denies Paranoia.  He has agreed to go to [facility] for outpatient treatment and medication management of his Anxiety.  Patient is discharged home and has promised to go straight to [place].  Past Psychiatric History:  GAD, Depression  Risk to Self: Suicidal Ideation: Yes-Currently Present Suicidal Intent: No-Not Currently/Within Last 6  Months Is patient at risk for suicide?: Yes Suicidal Plan?: No-Not Currently/Within Last 6 Months Access to Means: No What has been your use of drugs/alcohol within the last 12 months?: Pt denies  How many times?: 0 Other Self Harm Risks: Pt denies  Triggers for Past Attempts: None known (No previous attempts reported.) Intentional Self Injurious Behavior: None Risk to Others: Homicidal Ideation: No Thoughts of Harm to Others: No Current Homicidal Intent: No Current Homicidal Plan: No Access to Homicidal Means: No Identified Victim: N/A History of harm to others?: No Assessment of Violence: None Noted Violent Behavior Description: No violent behaviors observed. Pt is calm and cooperative at this time.  Does patient have access to weapons?: No Criminal Charges Pending?: No Does patient have a court date: No Prior Inpatient Therapy: Prior Inpatient Therapy: No Prior Outpatient Therapy: Prior Outpatient Therapy: Yes Prior Therapy Dates: Pt unable to recall dates Prior Therapy Facilty/Provider(s): [facility]  Reason for Treatment: anxiety  Does patient have an ACCT team?: No Does patient have Intensive In-House Services?  : No Does patient have [facility] services? : No Does patient have P4CC services?: No  Past Medical History:  Past Medical History  Diagnosis Date  . Degenerative disc disease   . Chronic back pain   . Pneumothorax on left   . Emphysema/COPD ([place])   . Opiate abuse, continuous     Past Surgical History  Procedure Laterality Date  . Lung surgery      Dr Arlyce Dice-- left lung  . Lung inflation      left lung  . Nose surgery      broken nose   Family History:  Family  History  Problem Relation Age of Onset  . Alpha-1 antitrypsin deficiency Mother   . Emphysema Mother   . Asthma Mother    Family Psychiatric  History: Denies Social History:  History  Alcohol Use No     History  Drug Use  . Yes  . Special: Oxycodone    Comment: opiates    Social  History   Social History  . Marital Status: Single    Spouse Name: N/A  . Number of Children: N/A  . Years of Education: N/A   Occupational History  . N/A    Social History Main Topics  . Smoking status: Current Every Day Smoker -- 1.50 packs/day for 12 years    Types: Cigarettes  . Smokeless tobacco: Never Used  . Alcohol Use: No  . Drug Use: Yes    Special: Oxycodone     Comment: opiates  . Sexual Activity: Not Asked   Other Topics Concern  . None   Social History Narrative   Additional Social History:    Allergies:   Allergies  Allergen Reactions  . Effexor [Venlafaxine] Diarrhea  . Hydrocodone-Acetaminophen     REACTION: severe headaches  . Ibuprofen Other (See Comments)    Severe stomach issues  . Lyrica [Pregabalin] Diarrhea    Swelling in the feet  . Neurontin [Gabapentin] Other (See Comments)    Severe stomach issues  . Nsaids Other (See Comments)    Severe stomach issues  . Tramadol Hcl     REACTION: diarrhea, abdominal pain    Labs:  Results for orders placed or performed during the hospital encounter of 07/16/15 (from the past 48 hour(s))  Comprehensive metabolic panel     Status: Abnormal   Collection Time: 07/16/15  4:01 PM  Result Value Ref Range   Sodium 141 135 - 145 mmol/L   Potassium 3.7 3.5 - 5.1 mmol/L   Chloride 107 101 - 111 mmol/L   CO2 27 22 - 32 mmol/L   Glucose, Bld 112 (H) 65 - 99 mg/dL   BUN 8 6 - 20 mg/dL   Creatinine, Ser 0.99 0.61 - 1.24 mg/dL   Calcium 9.2 8.9 - 10.3 mg/dL   Total Protein 7.2 6.5 - 8.1 g/dL   Albumin 4.5 3.5 - 5.0 g/dL   AST 15 15 - 41 U/L   ALT 12 (L) 17 - 63 U/L   Alkaline Phosphatase 82 38 - 126 U/L   Total Bilirubin 0.9 0.3 - 1.2 mg/dL   GFR calc non Af Amer >60 >60 mL/min   GFR calc Af Amer >60 >60 mL/min    Comment: (NOTE) The eGFR has been calculated using the CKD EPI equation. This calculation has not been validated in all clinical situations. eGFR's persistently <60 mL/min signify possible  Chronic Kidney Disease.    Anion gap 7 5 - 15  cbc     Status: Abnormal   Collection Time: 07/16/15  4:01 PM  Result Value Ref Range   WBC 9.5 4.0 - 10.5 K/uL   RBC 4.93 4.22 - 5.81 MIL/uL   Hemoglobin 15.9 13.0 - 17.0 g/dL   HCT 45.5 39.0 - 52.0 %   MCV 92.3 78.0 - 100.0 fL   MCH 32.3 26.0 - 34.0 pg   MCHC 34.9 30.0 - 36.0 g/dL   RDW 12.9 11.5 - 15.5 %   Platelets 407 (H) 150 - 400 K/uL  Ethanol     Status: None   Collection Time: 07/16/15  4:04 PM  Result Value Ref Range   Alcohol, Ethyl (B) <5 <5 mg/dL    Comment:        LOWEST DETECTABLE LIMIT FOR SERUM ALCOHOL IS 5 mg/dL FOR MEDICAL PURPOSES ONLY   Salicylate level     Status: None   Collection Time: 07/16/15  4:04 PM  Result Value Ref Range   Salicylate Lvl <6.8 2.8 - 30.0 mg/dL  Acetaminophen level     Status: Abnormal   Collection Time: 07/16/15  4:04 PM  Result Value Ref Range   Acetaminophen (Tylenol), Serum <10 (L) 10 - 30 ug/mL    Comment:        THERAPEUTIC CONCENTRATIONS VARY SIGNIFICANTLY. A RANGE OF 10-30 ug/mL MAY BE AN EFFECTIVE CONCENTRATION FOR MANY PATIENTS. HOWEVER, SOME ARE BEST TREATED AT CONCENTRATIONS OUTSIDE THIS RANGE. ACETAMINOPHEN CONCENTRATIONS >150 ug/mL AT 4 HOURS AFTER INGESTION AND >50 ug/mL AT 12 HOURS AFTER INGESTION ARE OFTEN ASSOCIATED WITH TOXIC REACTIONS.   Rapid urine drug screen (hospital performed)     Status: None   Collection Time: 07/16/15  5:22 PM  Result Value Ref Range   Opiates NONE DETECTED NONE DETECTED   Cocaine NONE DETECTED NONE DETECTED   Benzodiazepines NONE DETECTED NONE DETECTED   Amphetamines NONE DETECTED NONE DETECTED   Tetrahydrocannabinol NONE DETECTED NONE DETECTED   Barbiturates NONE DETECTED NONE DETECTED    Comment:        DRUG SCREEN FOR MEDICAL PURPOSES ONLY.  IF CONFIRMATION IS NEEDED FOR ANY PURPOSE, NOTIFY LAB WITHIN 5 DAYS.        LOWEST DETECTABLE LIMITS FOR URINE DRUG SCREEN Drug Class       Cutoff (ng/mL) Amphetamine       1000 Barbiturate      200 Benzodiazepine   127 Tricyclics       517 Opiates          300 Cocaine          300 THC              50     Current Facility-Administered Medications  Medication Dose Route Frequency Provider Last Rate Last Dose  . LORazepam (ATIVAN) tablet 1 mg  1 mg Oral Q8H PRN Davonna Belling, MD   1 mg at 07/16/15 2109  . nicotine (NICODERM CQ - dosed in mg/24 hours) patch 21 mg  21 mg Transdermal Once Gareth Morgan, MD   21 mg at 07/16/15 1920  . traZODone (DESYREL) tablet 50 mg  50 mg Oral QHS PRN Laverle Hobby, PA-C   50 mg at 07/17/15 0115   Current Outpatient Prescriptions  Medication Sig Dispense Refill  . acetaminophen (TYLENOL) 500 MG tablet Take 1,000 mg by mouth every 6 (six) hours as needed for mild pain or moderate pain.    . budesonide-formoterol (SYMBICORT) 80-4.5 MCG/ACT inhaler Inhale 2 puffs into the lungs 2 (two) times daily as needed (shortness of breathe.).    Marland Kitchen hydrOXYzine (ATARAX/VISTARIL) 25 MG tablet Take 1 tablet (25 mg total) by mouth every 6 (six) hours as needed for anxiety. 21 tablet 0  . loperamide (IMODIUM) 2 MG capsule Take 1 capsule (2 mg total) by mouth 4 (four) times daily as needed for diarrhea or loose stools. (Patient taking differently: Take 4 mg by mouth 4 (four) times daily as needed for diarrhea or loose stools. ) 12 capsule 0  . cloNIDine (CATAPRES) 0.1 MG tablet Take 1 tablet (0.1 mg total) by mouth 2 (two) times daily. Do not take if your  blood pressure is less than 110/60 or a heart rate is less than 60. (Patient not taking: Reported on 07/16/2015) 20 tablet 0  . ondansetron (ZOFRAN) 4 MG tablet Take 1 tablet (4 mg total) by mouth every 8 (eight) hours as needed for nausea or vomiting. (Patient not taking: Reported on 07/16/2015) 12 tablet 0  . QUEtiapine (SEROQUEL) 50 MG tablet Take 1 tablet (50 mg total) by mouth at bedtime as needed. (Patient not taking: Reported on 07/16/2015) 14 tablet 0  . QUEtiapine (SEROQUEL) 50 MG tablet  Take 1 tablet (50 mg total) by mouth at bedtime. (Patient not taking: Reported on 07/16/2015) 30 tablet 0    Musculoskeletal: Strength & Muscle Tone: within normal limits Gait & Station: normal Patient leans: N/A  Psychiatric Specialty Exam: Physical Exam  Review of Systems  Constitutional: Negative.   HENT: Negative.   Eyes: Negative.   Respiratory: Negative.   Cardiovascular: Negative.   Gastrointestinal: Negative.   Genitourinary: Negative.   Musculoskeletal: Negative.   Skin: Negative.   Neurological: Negative.   Endo/Heme/Allergies: Negative.     Blood pressure 119/86, pulse 97, temperature 98.5 F (36.9 C), temperature source Oral, resp. rate 20, SpO2 98 %.There is no weight on file to calculate BMI.  General Appearance: Casual  Eye Contact:  Good  Speech:  Clear and Coherent and Normal Rate  Volume:  Normal  Mood:  Anxious  Affect:  Congruent  Thought Process:  Coherent and Goal Directed  Orientation:  Full (Time, Place, and Person)  Thought Content:  Logical  Suicidal Thoughts:  No  Homicidal Thoughts:  No  Memory:  Immediate;   Good Recent;   Good Remote;   Good  Judgement:  Good  Insight:  Good  Psychomotor Activity:  Normal  Concentration:  Concentration: Good  Recall:  Good  Fund of Knowledge:  Good  Language:  Good  Akathisia:  No  Handed:  Right  AIMS (if indicated):     Assets:  Desire for Improvement  ADL's:  Intact  Cognition:  WNL  Sleep:       Disposition:   Discharge home and is encouraged to follow up with a counselor and Psychiatrist in [place], [place]   PMHNP-BC 07/17/2015 11:03 AM

## 2015-07-17 NOTE — ED Notes (Signed)
Pt states that he is ready to go home. He denies SI/HI, does not appear to be delusional or having hallucinations. He and his father expressed gratitude for his care. Belongings returned to pt who signed for same. Discharge instructions read to pt and he verbalized understanding. Escorted pt to the lobby where he changed clothes -in the men's room. His father was with him.

## 2015-10-01 ENCOUNTER — Observation Stay (HOSPITAL_COMMUNITY)
Admission: AD | Admit: 2015-10-01 | Discharge: 2015-10-02 | Disposition: A | Discharge status: Home / Self Care | Payer: Self-pay | Source: Intra-hospital | Attending: Psychiatry | Admitting: Psychiatry

## 2015-10-01 ENCOUNTER — Emergency Department (HOSPITAL_COMMUNITY)
Admission: EM | Admit: 2015-10-01 | Discharge: 2015-10-01 | Disposition: A | Discharge status: Other Acute Inpatient Hospital | Payer: Self-pay | Attending: Emergency Medicine | Admitting: Emergency Medicine

## 2015-10-01 ENCOUNTER — Encounter (HOSPITAL_COMMUNITY): Payer: Self-pay | Admitting: *Deleted

## 2015-10-01 ENCOUNTER — Encounter (HOSPITAL_COMMUNITY): Payer: Self-pay | Admitting: Emergency Medicine

## 2015-10-01 DIAGNOSIS — R45851 Suicidal ideations: Secondary | ICD-10-CM

## 2015-10-01 DIAGNOSIS — Z720 Tobacco use: Secondary | ICD-10-CM

## 2015-10-01 DIAGNOSIS — F17210 Nicotine dependence, cigarettes, uncomplicated: Secondary | ICD-10-CM | POA: Insufficient documentation

## 2015-10-01 DIAGNOSIS — Z79899 Other long term (current) drug therapy: Secondary | ICD-10-CM | POA: Insufficient documentation

## 2015-10-01 DIAGNOSIS — J449 Chronic obstructive pulmonary disease, unspecified: Secondary | ICD-10-CM | POA: Insufficient documentation

## 2015-10-01 DIAGNOSIS — F332 Major depressive disorder, recurrent severe without psychotic features: Secondary | ICD-10-CM | POA: Insufficient documentation

## 2015-10-01 DIAGNOSIS — F1193 Opioid use, unspecified with withdrawal: Secondary | ICD-10-CM | POA: Diagnosis present

## 2015-10-01 DIAGNOSIS — R197 Diarrhea, unspecified: Secondary | ICD-10-CM | POA: Insufficient documentation

## 2015-10-01 DIAGNOSIS — R000 Tachycardia, unspecified: Secondary | ICD-10-CM | POA: Insufficient documentation

## 2015-10-01 DIAGNOSIS — F411 Generalized anxiety disorder: Secondary | ICD-10-CM | POA: Insufficient documentation

## 2015-10-01 DIAGNOSIS — F1120 Opioid dependence, uncomplicated: Secondary | ICD-10-CM | POA: Insufficient documentation

## 2015-10-01 DIAGNOSIS — F419 Anxiety disorder, unspecified: Secondary | ICD-10-CM

## 2015-10-01 DIAGNOSIS — D473 Essential (hemorrhagic) thrombocythemia: Secondary | ICD-10-CM | POA: Insufficient documentation

## 2015-10-01 DIAGNOSIS — F1994 Other psychoactive substance use, unspecified with psychoactive substance-induced mood disorder: Principal | ICD-10-CM | POA: Insufficient documentation

## 2015-10-01 DIAGNOSIS — D75839 Thrombocytosis, unspecified: Secondary | ICD-10-CM

## 2015-10-01 DIAGNOSIS — F1123 Opioid dependence with withdrawal: Secondary | ICD-10-CM | POA: Insufficient documentation

## 2015-10-01 LAB — RAPID URINE DRUG SCREEN, HOSP PERFORMED
AMPHETAMINES: NOT DETECTED
BARBITURATES: NOT DETECTED
BENZODIAZEPINES: POSITIVE — AB
Cocaine: NOT DETECTED
Opiates: POSITIVE — AB
TETRAHYDROCANNABINOL: NOT DETECTED

## 2015-10-01 LAB — COMPREHENSIVE METABOLIC PANEL
ALBUMIN: 4.8 g/dL (ref 3.5–5.0)
ALT: 16 U/L — AB (ref 17–63)
AST: 21 U/L (ref 15–41)
Alkaline Phosphatase: 99 U/L (ref 38–126)
Anion gap: 9 (ref 5–15)
BUN: 10 mg/dL (ref 6–20)
CHLORIDE: 101 mmol/L (ref 101–111)
CO2: 26 mmol/L (ref 22–32)
CREATININE: 1.12 mg/dL (ref 0.61–1.24)
Calcium: 9.4 mg/dL (ref 8.9–10.3)
GFR calc Af Amer: >60 mL/min (ref >60)
GLUCOSE: 112 mg/dL — AB (ref 65–99)
POTASSIUM: 3.5 mmol/L (ref 3.5–5.1)
Sodium: 136 mmol/L (ref 135–145)
Total Bilirubin: 0.5 mg/dL (ref 0.3–1.2)
Total Protein: 7.7 g/dL (ref 6.5–8.1)

## 2015-10-01 LAB — SALICYLATE LEVEL: Salicylate Lvl: <4 mg/dL (ref 2.8–30.0)

## 2015-10-01 LAB — CBC
HEMATOCRIT: 48.1 % (ref 39.0–52.0)
HEMOGLOBIN: 16.9 g/dL (ref 13.0–17.0)
MCH: 32.6 pg (ref 26.0–34.0)
MCHC: 35.1 g/dL (ref 30.0–36.0)
MCV: 92.7 fL (ref 78.0–100.0)
PLATELETS: 447 10*3/uL — AB (ref 150–400)
RBC: 5.19 MIL/uL (ref 4.22–5.81)
RDW: 13.3 % (ref 11.5–15.5)
WBC: 12.5 10*3/uL — AB (ref 4.0–10.5)

## 2015-10-01 LAB — ACETAMINOPHEN LEVEL: Acetaminophen (Tylenol), Serum: <10 ug/mL — ABNORMAL LOW (ref 10–30)

## 2015-10-01 LAB — ETHANOL

## 2015-10-01 MED ORDER — MAGNESIUM HYDROXIDE 400 MG/5ML PO SUSP: 30.0000 mL | Freq: Every day | ORAL | Status: DC | PRN

## 2015-10-01 MED ORDER — DICYCLOMINE HCL 20 MG PO TABS: 20.0000 mg | ORAL_TABLET | Freq: Four times a day (QID) | ORAL | Status: DC | PRN

## 2015-10-01 MED ORDER — METHOCARBAMOL 500 MG PO TABS: 500.0000 mg | ORAL_TABLET | Freq: Three times a day (TID) | ORAL | Status: DC | PRN

## 2015-10-01 MED ORDER — HYDROXYZINE HCL 25 MG PO TABS
25.0000 mg | ORAL_TABLET | Freq: Four times a day (QID) | ORAL | Status: DC | PRN
  Administered 2015-10-01: 25 mg via ORAL
  Filled 2015-10-01: qty 1

## 2015-10-01 MED ORDER — CLONIDINE HCL 0.1 MG PO TABS: 0.1000 mg | ORAL_TABLET | ORAL | Status: DC

## 2015-10-01 MED ORDER — CLONIDINE HCL 0.1 MG PO TABS
0.1000 mg | ORAL_TABLET | Freq: Four times a day (QID) | ORAL | Status: DC
  Administered 2015-10-01: 0.1 mg via ORAL
  Filled 2015-10-01: qty 1

## 2015-10-01 MED ORDER — ONDANSETRON HCL 4 MG PO TABS: 4.0000 mg | ORAL_TABLET | Freq: Three times a day (TID) | ORAL | Status: DC | PRN

## 2015-10-01 MED ORDER — ZOLPIDEM TARTRATE 5 MG PO TABS: 5.0000 mg | ORAL_TABLET | Freq: Every evening | ORAL | Status: DC | PRN

## 2015-10-01 MED ORDER — ONDANSETRON 4 MG PO TBDP
4.0000 mg | ORAL_TABLET | Freq: Four times a day (QID) | ORAL | Status: DC | PRN
  Administered 2015-10-02 (×2): 4 mg via ORAL
  Filled 2015-10-01 (×2): qty 1

## 2015-10-01 MED ORDER — HYDROXYZINE HCL 25 MG PO TABS
25.0000 mg | ORAL_TABLET | Freq: Four times a day (QID) | ORAL | Status: DC | PRN
  Administered 2015-10-01 – 2015-10-02 (×2): 25 mg via ORAL
  Filled 2015-10-01 (×2): qty 1

## 2015-10-01 MED ORDER — ACETAMINOPHEN 325 MG PO TABS
650.0000 mg | ORAL_TABLET | ORAL | Status: DC | PRN
Start: 2015-10-01 — End: 2015-10-01
  Administered 2015-10-01: 650 mg via ORAL
  Filled 2015-10-01: qty 2

## 2015-10-01 MED ORDER — ONDANSETRON 4 MG PO TBDP: 4.0000 mg | ORAL_TABLET | Freq: Four times a day (QID) | ORAL | Status: DC | PRN

## 2015-10-01 MED ORDER — CLONIDINE HCL 0.1 MG PO TABS: 0.1000 mg | ORAL_TABLET | Freq: Every day | ORAL | Status: DC

## 2015-10-01 MED ORDER — DICYCLOMINE HCL 20 MG PO TABS
20.0000 mg | ORAL_TABLET | Freq: Four times a day (QID) | ORAL | Status: DC | PRN
  Administered 2015-10-02 (×2): 20 mg via ORAL
  Filled 2015-10-01 (×2): qty 1

## 2015-10-01 MED ORDER — LOPERAMIDE HCL 2 MG PO CAPS
2.0000 mg | ORAL_CAPSULE | Freq: Four times a day (QID) | ORAL | Status: DC | PRN
  Administered 2015-10-01: 2 mg via ORAL
  Filled 2015-10-01: qty 1

## 2015-10-01 MED ORDER — IBUPROFEN 200 MG PO TABS: 400.0000 mg | ORAL_TABLET | Freq: Three times a day (TID) | ORAL | Status: DC | PRN

## 2015-10-01 MED ORDER — METHOCARBAMOL 500 MG PO TABS
500.0000 mg | ORAL_TABLET | Freq: Three times a day (TID) | ORAL | Status: DC | PRN
  Filled 2015-10-01: qty 1

## 2015-10-01 MED ORDER — ALUM & MAG HYDROXIDE-SIMETH 200-200-20 MG/5ML PO SUSP: 30.0000 mL | ORAL | Status: DC | PRN

## 2015-10-01 MED ORDER — LOPERAMIDE HCL 2 MG PO CAPS: 4.0000 mg | ORAL_CAPSULE | Freq: Four times a day (QID) | ORAL | Status: DC | PRN

## 2015-10-01 MED ORDER — TRAZODONE HCL 50 MG PO TABS
50.0000 mg | ORAL_TABLET | Freq: Every evening | ORAL | Status: DC | PRN
  Administered 2015-10-01: 50 mg via ORAL
  Filled 2015-10-01: qty 1

## 2015-10-01 MED ORDER — MOMETASONE FURO-FORMOTEROL FUM 100-5 MCG/ACT IN AERO
2.0000 | INHALATION_SPRAY | Freq: Two times a day (BID) | RESPIRATORY_TRACT | Status: DC
  Filled 2015-10-01: qty 8.8

## 2015-10-01 MED ORDER — HYDROXYZINE HCL 25 MG PO TABS
25.0000 mg | ORAL_TABLET | Freq: Four times a day (QID) | ORAL | Status: DC | PRN
Start: 2015-10-01 — End: 2015-10-01

## 2015-10-01 MED ORDER — NICOTINE 21 MG/24HR TD PT24
21.0000 mg | MEDICATED_PATCH | Freq: Every day | TRANSDERMAL | Status: DC
  Administered 2015-10-01: 21 mg via TRANSDERMAL
  Filled 2015-10-01: qty 1

## 2015-10-01 MED ORDER — LOPERAMIDE HCL 2 MG PO CAPS: 2.0000 mg | ORAL_CAPSULE | ORAL | Status: DC | PRN

## 2015-10-01 NOTE — ED Notes (Signed)
Bed: [facility] Expected date:  Expected time:  Means of arrival:  Comments: [place]

## 2015-10-01 NOTE — ED Triage Notes (Signed)
Pt reports anxiety and SI. Symptoms triggered by death in family 3 months ago. Sister has also been admitted for psychiatric symptoms as well. No HI. Has been using oxycodone off label.

## 2015-10-01 NOTE — ED Notes (Signed)
TTS at bedside. 

## 2015-10-01 NOTE — BH Assessment (Signed)
[facility] Assessment Progress Note  10/01/15: Patient will be admitted to Observation Unit Bed 6.

## 2015-10-01 NOTE — Progress Notes (Addendum)
This is a 40 years old Caucasian male admitted to the unit for Opiate withdrawal. Patient reported that he has been getting 60 mg OxyContin from the street for moths. He reported that his mother died 2 months ago after she had been sick for 18 years. He stated that his anxiety increased since the death of his mother. He denied any history of suicide attempts but endorsed family history of suicide. His skin assessment was within limit. White blood count elevated and temperature high. His B/P was low. Writer unable to give the HS Clonidine due to low B/P. Patient appeared sad, depressed and anxious. He received Trazodone for sleep and Vistaril for anxiety. Writer encouraged and supported patient. Safety maintained.  0355 AM: Patient's Blood Pressure in within normal limit 101/71. Patient unable to sleep and continue to complain of withdrawals. HS Clonidine held given at this time.

## 2015-10-01 NOTE — ED Notes (Addendum)
Report called to [place], [facility], [place] [facility].  Pending transfer via [place].

## 2015-10-01 NOTE — ED Provider Notes (Signed)
[place] [facility] DEPT Provider Note   CSN: [id number] Arrival date & time: 10/01/15  1439  First Provider Contact:  First MD Initiated Contact with Patient 10/01/15 1508        History   Chief Complaint Chief Complaint  Patient presents with  . Suicidal    HPI SLONE FAZEL is a 40 y.o. male with a PMHx of chronic back pain, COPD, DDD, opiate abuse, and GAD, who presents to the ED with complaints of suicidal thoughts without a specific plan and anxiety. States that 3 months ago he had some deaths in the family that triggered his symptoms. He also admits that he has been using opiates illicitly taking Percocet that he obtains without a prescription. Denies any other illicit drug use or alcohol use. Denies HI or AVH. He is not on any psychiatric medications, was previously on Klonopin for anxiety but has not had this in many years. He states that he is having some diarrhea, with 3 loose stools today that were nonbloody, and he attributes this to withdrawing from Percocet. He denies any other medical complaints at this time, including fevers, chills, chest pain, shortness breath, abdominal pain, nausea, vomiting, constipation, dysuria, hematuria, melena, hematochezia, numbness, tingling, or focal weakness. +Smoker. Here voluntarily.    The history is provided by the patient and medical records. No language interpreter was used.    Past Medical History:  Diagnosis Date  . Chronic back pain   . Degenerative disc disease   . Emphysema/COPD ([place])   . Opiate abuse, continuous   . Pneumothorax on left     Patient Active Problem List   Diagnosis Date Noted  . Anxiety   . Opiate withdrawal ([place]) 03/21/2015  . Substance induced mood disorder ([place]) 03/20/2015  . GAD (generalized anxiety disorder)   . Opiate abuse, continuous 01/07/2015  . Moderate benzodiazepine use disorder 01/06/2015  . MDD (major depressive disorder), recurrent episode, severe ([place]) 01/06/2015  . Bullous emphysema ([place])  11/30/2012  . COPD (chronic obstructive pulmonary disease) with emphysema ([place]) 11/02/2012  . Atypical chest pain 11/02/2012  . ANXIETY 08/01/2009  . DEPRESSION 08/01/2009  . ALLERGIC RHINITIS 08/01/2009  . PAIN IN JOINT, MULTIPLE SITES 08/01/2009  . BACK PAIN, LUMBAR, CHRONIC 08/01/2009  . HEADACHE 08/01/2009  . DIARRHEA, CHRONIC 08/01/2009    Past Surgical History:  Procedure Laterality Date  . lung inflation     left lung  . LUNG SURGERY     Dr Arlyce Dice-- left lung  . NOSE SURGERY     broken nose       Home Medications    Prior to Admission medications   Medication Sig Start Date End Date Taking? Authorizing Provider  budesonide-formoterol (SYMBICORT) 80-4.5 MCG/ACT inhaler Inhale 2 puffs into the lungs 2 (two) times daily as needed (shortness of breathe.).    Historical Provider, MD  hydrOXYzine (ATARAX/VISTARIL) 25 MG tablet Take 1 tablet (25 mg total) by mouth every 6 (six) hours as needed for anxiety. 07/07/15   Malvin Johns, MD  loperamide (IMODIUM) 2 MG capsule Take 1 capsule (2 mg total) by mouth 4 (four) times daily as needed for diarrhea or loose stools. Patient taking differently: Take 4 mg by mouth 4 (four) times daily as needed for diarrhea or loose stools.  04/16/15   [name], MD    Family History Family History  Problem Relation Age of Onset  . Alpha-1 antitrypsin deficiency Mother   . Emphysema Mother   . Asthma Mother  Social History Social History  Substance Use Topics  . Smoking status: Current Every Day Smoker    Packs/day: 1.50    Years: 12.00    Types: Cigarettes  . Smokeless tobacco: Never Used  . Alcohol use No     Allergies   Effexor [venlafaxine]; Hydrocodone-acetaminophen; Ibuprofen; Lyrica [pregabalin]; Neurontin [gabapentin]; Nsaids; and Tramadol hcl   Review of Systems Review of Systems  Constitutional: Negative for chills and fever.  Respiratory: Negative for shortness of breath.   Cardiovascular: Negative for chest  pain.  Gastrointestinal: Positive for diarrhea. Negative for abdominal pain, blood in stool, constipation, nausea and vomiting.  Genitourinary: Negative for dysuria and hematuria.  Musculoskeletal: Negative for arthralgias and myalgias.  Skin: Negative for color change.  Allergic/Immunologic: Negative for immunocompromised state.  Neurological: Negative for weakness and numbness.  Psychiatric/Behavioral: Positive for suicidal ideas. Negative for confusion and hallucinations.   10 Systems reviewed and are negative for acute change except as noted in the HPI.   Physical Exam Updated Vital Signs BP (!) 127/103   Pulse 113   Temp 98.6 F (37 C) (Oral)   Resp 16   SpO2 98%   Physical Exam  Constitutional: He is oriented to person, place, and time. Vital signs are normal. He appears well-developed and well-nourished.  Non-toxic appearance. No distress.  Afebrile, nontoxic, NAD  HENT:  Head: Normocephalic and atraumatic.  Mouth/Throat: Oropharynx is clear and moist and mucous membranes are normal.  Eyes: Conjunctivae and EOM are normal. Right eye exhibits no discharge. Left eye exhibits no discharge.  Neck: Normal range of motion. Neck supple.  Cardiovascular: Regular rhythm, normal heart sounds and intact distal pulses.  Tachycardia present.  Exam reveals no gallop and no friction rub.   No murmur heard. Mildly tachycardic  Pulmonary/Chest: Effort normal and breath sounds normal. No respiratory distress. He has no decreased breath sounds. He has no wheezes. He has no rhonchi. He has no rales.  Abdominal: Soft. Normal appearance and bowel sounds are normal. He exhibits no distension. There is no tenderness. There is no rigidity, no rebound, no guarding, no CVA tenderness, no tenderness at McBurney's point and negative Murphy's sign.  Soft, NTND, +BS throughout, no r/g/r, neg murphy's, neg mcburney's, no CVA TTP   Musculoskeletal: Normal range of motion.  Neurological: He is alert and  oriented to person, place, and time. He has normal strength. No sensory deficit.  Skin: Skin is warm, dry and intact. No rash noted.  Psychiatric: He is not actively hallucinating. He exhibits a depressed mood. He expresses suicidal ideation. He expresses no homicidal ideation. He expresses no suicidal plans and no homicidal plans.  Depressed affect. Endorsing SI without plan, denies HI/AVH  Nursing note and vitals reviewed.    ED Treatments / Results  Labs (all labs ordered are listed, but only abnormal results are displayed) Labs Reviewed  COMPREHENSIVE METABOLIC PANEL - Abnormal; Notable for the following:       Result Value   Glucose, Bld 112 (*)    ALT 16 (*)    All other components within normal limits  ACETAMINOPHEN LEVEL - Abnormal; Notable for the following:    Acetaminophen (Tylenol), Serum <10 (*)    All other components within normal limits  CBC - Abnormal; Notable for the following:    WBC 12.5 (*)    Platelets 447 (*)    All other components within normal limits  ETHANOL  SALICYLATE LEVEL  URINE RAPID DRUG SCREEN, HOSP PERFORMED    EKG  EKG Interpretation None       Radiology No results found.  Procedures Procedures (including critical care time)  Medications Ordered in ED Medications  mometasone-formoterol (DULERA) 100-5 MCG/ACT inhaler 2 puff (not administered)  hydrOXYzine (ATARAX/VISTARIL) tablet 25 mg (not administered)  loperamide (IMODIUM) capsule 2 mg (not administered)  alum & mag hydroxide-simeth (MAALOX/MYLANTA) 200-200-20 MG/5ML suspension 30 mL (not administered)  ondansetron (ZOFRAN) tablet 4 mg (not administered)  nicotine (NICODERM CQ - dosed in mg/24 hours) patch 21 mg (not administered)  zolpidem (AMBIEN) tablet 5 mg (not administered)  acetaminophen (TYLENOL) tablet 650 mg (not administered)  ibuprofen (ADVIL,MOTRIN) tablet 400 mg (not administered)     Initial Impression / Assessment and Plan / ED Course  I have reviewed the  triage vital signs and the nursing notes.  Pertinent labs & imaging results that were available during my care of the patient were reviewed by me and considered in my medical decision making (see chart for details).  Clinical Course    40 y.o. male here voluntarily for suicidal thoughts without a plan, and anxiety. He also admits that he has been using opiates illicitly. He is having some withdrawal symptoms including diarrhea with 3 loose stools today. No melena or hematochezia. No other medical complaints at this time. Denies HI or AVH. He is not taking any psychiatric medications, years ago he was on Klonopin but he is not taking that. He is a smoker and we discussed smoking cessation. Mildly tachycardic likely related to anxiety and withdrawal from opiates. Denies CP/SOB. No abdominal tenderness. Physical exam benign. Will get med clearance labs, reorder home meds and psych hold orders, with TTS consult. Will reassess after labs return   4:04 PM Tylenol and salicylate levels WNL. EtOH neg. CBC with mildly elevated Plts, similar to prior. CMP WNL. UDS not yet done but pt medically cleared at this time. Please see TTS notes for further documentation of care/dispo. Holding orders placed.   Final Clinical Impressions(s) / ED Diagnoses   Final diagnoses:  Suicidal ideation  Anxiety  Tachycardia  Diarrhea, unspecified type  Opioid withdrawal ([place])  Tobacco use  Thrombocytosis ([facility])    New Prescriptions New Prescriptions   No medications on file     Crosby Camprubi-Soms, PA-C 10/01/15 1605    Duffy Bruce, MD 10/02/15 1112

## 2015-10-01 NOTE — ED Notes (Signed)
Pt oriented to room and unit.  Pt is pleasant and cooperative.  Denies S/I and H/I but complains of severe anxiety.  15 minute checks and video monitoring in place.

## 2015-10-01 NOTE — BH Assessment (Signed)
Assessment Note  Brett Beck is an 40 y.o. male that presents this date to [facility] reporting increasing anxiety and continued SA use. Patient reports increased anxiety since his mother passed away two months ago and has been residing with his sister who has [place] issues that are not being treated. Patient states he continues to use 60 mg of Oxycodone a day with last reported use being on 10/01/15 when patient reported consuming 60 mg. Patient stated he is currently starting to suffer from withdrawals to include: nausea and tremors. Patient currently denies use of any other illicit substances. Patient states he has S/I with intention to harm himself but could not verbalize a plan. Patient has had multiple inpatient hospitalizations with the last one being 07/16/15 at [facility] for substance abuse mood D/O. Patient denies any H/I, AVH or current legal. Patient is time place oriented and presents with a very depressed affect. Patient reports ongoing symptoms of depression to include: feelings of hopelessness and anhedonia. Pt stated he has a family history of suicide and reported that his uncle committed suicide. Patient did not report any current mental health treatment or psychiatric outpatient care. Pt did not report any physical, sexual or emotional abuse at this time. Case was staffed with Reita Cliche DNP who recommended Observational Unit.   Diagnosis: Major depressive D/O recurrent severe, GAD, Opioid use severe  Past Medical History:  Past Medical History:  Diagnosis Date  . Chronic back pain   . Degenerative disc disease   . Emphysema/COPD ([place])   . Opiate abuse, continuous   . Pneumothorax on left     Past Surgical History:  Procedure Laterality Date  . lung inflation     left lung  . LUNG SURGERY     Dr Arlyce Dice-- left lung  . NOSE SURGERY     broken nose    Family History:  Family History  Problem Relation Age of Onset  . Alpha-1 antitrypsin deficiency Mother   . Emphysema Mother   . Asthma  Mother     Social History:  reports that he has been smoking Cigarettes.  He has a 18.00 pack-year smoking history. He has never used smokeless tobacco. He reports that he uses drugs, including Oxycodone. He reports that he does not drink alcohol.  Additional Social History:  Alcohol / Drug Use Pain Medications: SEE MAR Prescriptions: SEE MAR Over the Counter: SEE MAR History of alcohol / drug use?: Yes Longest period of sobriety (when/how long): unable to recall Negative Consequences of Use: Financial, Legal, Personal relationships Withdrawal Symptoms: Fever / Chills, Irritability, Nausea / Vomiting, Sweats, Tremors, Weakness, Diarrhea Substance #1 Name of Substance 1: Opiates 1 - Age of First Use: 17 1 - Amount (size/oz): 60 mg daily Oxycodone 1 - Frequency: daily 1 - Duration: Last two weeks 1 - Last Use / Amount: 09/30/15 pt reported using 60 mg Oxycodone  CIWA: CIWA-Ar BP: (!) 127/103 Pulse Rate: 113 COWS:    Allergies:  Allergies  Allergen Reactions  . Effexor [Venlafaxine] Diarrhea  . Hydrocodone-Acetaminophen     REACTION: severe headaches  . Ibuprofen Other (See Comments)    Severe stomach issues  . Lyrica [Pregabalin] Diarrhea    Swelling in the feet  . Neurontin [Gabapentin] Other (See Comments)    Severe stomach issues  . Nsaids Other (See Comments)    Severe stomach issues  . Tramadol Hcl     REACTION: diarrhea, abdominal pain    Home Medications:  (Not in a hospital admission)  OB/GYN Status:  No LMP for male patient.  General Assessment Data Location of Assessment: [facility] TTS Assessment: In system Is this a Tele or Face-to-Face Assessment?: Face-to-Face Is this an Initial Assessment or a Re-assessment for this encounter?: Initial Assessment Marital status: Single Maiden name: na Is patient pregnant?: No Pregnancy Status: No Living Arrangements: Other relatives Can pt return to current living arrangement?: Yes Admission Status: Voluntary Is  patient capable of signing voluntary admission?: Yes Referral Source: Self/Family/Friend Insurance type: No  Medical Screening Exam [facility] Walk-in ONLY) Medical Exam completed: Yes  Crisis Care Plan Living Arrangements: Other relatives Legal Guardian: Other: (none) Name of Psychiatrist: None Name of Therapist: None  Education Status Is patient currently in school?: No Current Grade: na Highest grade of school patient has completed: 12 Name of school: na Contact person: na  Risk to self with the past 6 months Suicidal Ideation: Yes-Currently Present Has patient been a risk to self within the past 6 months prior to admission? : No Suicidal Intent: Yes-Currently Present Has patient had any suicidal intent within the past 6 months prior to admission? : No Is patient at risk for suicide?: Yes Suicidal Plan?: Yes-Currently Present Has patient had any suicidal plan within the past 6 months prior to admission? : No Specify Current Suicidal Plan: pt states he doesn't know Access to Means: No What has been your use of drugs/alcohol within the last 12 months?: Current use Previous Attempts/Gestures: No How many times?: 0 Other Self Harm Risks: None Triggers for Past Attempts: Unknown Intentional Self Injurious Behavior: None Family Suicide History: Yes Recent stressful life event(s): Other (Comment) (relationship issues) Persecutory voices/beliefs?: No Depression: Yes Depression Symptoms: Loss of interest in usual pleasures, Feeling worthless/self pity Substance abuse history and/or treatment for substance abuse?: Yes Suicide prevention information given to non-admitted patients: Not applicable  Risk to Others within the past 6 months Homicidal Ideation: No Does patient have any lifetime risk of violence toward others beyond the six months prior to admission? : No Thoughts of Harm to Others: No Current Homicidal Intent: No Current Homicidal Plan: No Access to Homicidal Means:  No Identified Victim: na History of harm to others?: No Assessment of Violence: None Noted Violent Behavior Description: na Does patient have access to weapons?: No Criminal Charges Pending?: No Does patient have a court date: No Is patient on probation?: No  Psychosis Hallucinations: None noted Delusions: None noted  Mental Status Report Appearance/Hygiene: Unremarkable Eye Contact: Fair Motor Activity: Freedom of movement Speech: Logical/coherent Level of Consciousness: Alert Mood: Depressed Affect: Appropriate to circumstance Anxiety Level: Moderate Thought Processes: Coherent, Relevant Judgement: Unimpaired Orientation: Person, Place, Time Obsessive Compulsive Thoughts/Behaviors: None  Cognitive Functioning Concentration: Normal Memory: Recent Intact, Remote Intact IQ: Average Insight: Poor Impulse Control: Poor Appetite: Fair Weight Loss: 0 Weight Gain: 0 Sleep: Decreased Total Hours of Sleep: 4 Vegetative Symptoms: None  ADLScreening [facility] Assessment Services) Patient's cognitive ability adequate to safely complete daily activities?: Yes Patient able to express need for assistance with ADLs?: Yes Independently performs ADLs?: Yes (appropriate for developmental age)  Prior Inpatient Therapy Prior Inpatient Therapy: Yes Prior Therapy Dates: 2017 Prior Therapy Facilty/Provider(s): [facility] Reason for Treatment: SA use, SI  Prior Outpatient Therapy Prior Outpatient Therapy: No Prior Therapy Dates: na Prior Therapy Facilty/Provider(s): na Reason for Treatment: na Does patient have an ACCT team?: No Does patient have Intensive In-House Services?  : No Does patient have [facility] services? : No Does patient have P4CC services?: No  ADL Screening (  condition at time of admission) Patient's cognitive ability adequate to safely complete daily activities?: Yes Is the patient deaf or have difficulty hearing?: No Does the patient have difficulty seeing, even when  wearing glasses/contacts?: No Does the patient have difficulty concentrating, remembering, or making decisions?: No Patient able to express need for assistance with ADLs?: Yes Does the patient have difficulty dressing or bathing?: No Independently performs ADLs?: Yes (appropriate for developmental age) Does the patient have difficulty walking or climbing stairs?: No Weakness of Legs: None Weakness of Arms/Hands: None  Home Assistive Devices/Equipment Home Assistive Devices/Equipment: None  Therapy Consults (therapy consults require a physician order) PT Evaluation Needed: No OT Evalulation Needed: No SLP Evaluation Needed: No Abuse/Neglect Assessment (Assessment to be complete while patient is alone) Physical Abuse: Denies Verbal Abuse: Denies Sexual Abuse: Denies Exploitation of patient/patient's resources: Denies Self-Neglect: Denies Values / Beliefs Cultural Requests During Hospitalization: None Spiritual Requests During Hospitalization: None Consults Spiritual Care Consult Needed: No Social Work Consult Needed: No Regulatory affairs officer (For Healthcare) Does patient have an advance directive?: No Would patient like information on creating an advanced directive?: No - patient declined information (patient declined)          Disposition: Case was staffed with Reita Cliche DNP who recommended Observational Unit. Disposition Initial Assessment Completed for this Encounter: Yes Disposition of Patient: Other dispositions Other disposition(s): Other (Comment) (pt recommended to Observation Unit)  On Site Evaluation by:   Reviewed with Physician:    Mamie Nick 10/01/2015 3:57 PM

## 2015-10-02 DIAGNOSIS — F332 Major depressive disorder, recurrent severe without psychotic features: Secondary | ICD-10-CM | POA: Diagnosis present

## 2015-10-02 MED ORDER — MOMETASONE FURO-FORMOTEROL FUM 100-5 MCG/ACT IN AERO
2.0000 | INHALATION_SPRAY | Freq: Two times a day (BID) | RESPIRATORY_TRACT | Status: DC
  Administered 2015-10-02: 2 via RESPIRATORY_TRACT
  Filled 2015-10-02: qty 8.8

## 2015-10-02 MED ORDER — ONDANSETRON HCL 4 MG PO TABS: 4.0000 mg | ORAL_TABLET | Freq: Three times a day (TID) | ORAL | Status: DC | PRN

## 2015-10-02 MED ORDER — FLUOXETINE HCL 10 MG PO CAPS: 10.0000 mg | ORAL_CAPSULE | Freq: Every day | ORAL | 0 refills | Status: DC

## 2015-10-02 MED ORDER — TRAZODONE HCL 100 MG PO TABS
100.0000 mg | ORAL_TABLET | Freq: Every evening | ORAL | Status: DC | PRN
  Filled 2015-10-02: qty 14

## 2015-10-02 MED ORDER — HYDROXYZINE HCL 50 MG PO TABS: 50.0000 mg | ORAL_TABLET | Freq: Four times a day (QID) | ORAL | 0 refills | Status: DC | PRN

## 2015-10-02 MED ORDER — NICOTINE 21 MG/24HR TD PT24
21.0000 mg | MEDICATED_PATCH | Freq: Every day | TRANSDERMAL | Status: DC
  Administered 2015-10-02: 21 mg via TRANSDERMAL
  Filled 2015-10-02: qty 1

## 2015-10-02 MED ORDER — MOMETASONE FURO-FORMOTEROL FUM 100-5 MCG/ACT IN AERO
2.0000 | INHALATION_SPRAY | Freq: Two times a day (BID) | RESPIRATORY_TRACT | Status: DC
Start: 2015-10-02 — End: 2016-02-15

## 2015-10-02 MED ORDER — TRAZODONE HCL 100 MG PO TABS: 100.0000 mg | ORAL_TABLET | Freq: Every evening | ORAL | 0 refills | Status: DC | PRN

## 2015-10-02 MED ORDER — LOPERAMIDE HCL 2 MG PO CAPS
2.0000 mg | ORAL_CAPSULE | Freq: Four times a day (QID) | ORAL | Status: DC | PRN
  Administered 2015-10-02: 2 mg via ORAL
  Filled 2015-10-02: qty 1

## 2015-10-02 MED ORDER — MAGNESIUM HYDROXIDE 400 MG/5ML PO SUSP: 30.0000 mL | Freq: Every day | ORAL | Status: DC | PRN

## 2015-10-02 MED ORDER — ALUM & MAG HYDROXIDE-SIMETH 200-200-20 MG/5ML PO SUSP
30.0000 mL | ORAL | Status: DC | PRN
Start: 2015-10-02 — End: 2015-10-02

## 2015-10-02 MED ORDER — FLUOXETINE HCL 10 MG PO CAPS
10.0000 mg | ORAL_CAPSULE | Freq: Every day | ORAL | Status: DC
  Administered 2015-10-02: 10 mg via ORAL
  Filled 2015-10-02: qty 1
  Filled 2015-10-02: qty 7

## 2015-10-02 MED ORDER — HYDROXYZINE HCL 50 MG PO TABS
50.0000 mg | ORAL_TABLET | Freq: Four times a day (QID) | ORAL | Status: DC | PRN
  Administered 2015-10-02: 50 mg via ORAL
  Filled 2015-10-02: qty 10
  Filled 2015-10-02: qty 1

## 2015-10-02 MED ORDER — ZOLPIDEM TARTRATE 5 MG PO TABS: 5.0000 mg | ORAL_TABLET | Freq: Every evening | ORAL | Status: DC | PRN

## 2015-10-02 MED ORDER — ACETAMINOPHEN 325 MG PO TABS
650.0000 mg | ORAL_TABLET | Freq: Four times a day (QID) | ORAL | Status: DC | PRN
  Administered 2015-10-02 (×2): 650 mg via ORAL
  Filled 2015-10-02 (×2): qty 2

## 2015-10-02 NOTE — H&P (Signed)
[facility] OBS UNIT H&P  Patient Identification: Brett Beck MRN:  [id number] Date of Evaluation:  10/02/2015 Chief Complaint: Patient states "My anxiety has become intense since my mother died and my sister has been struggling with her mental illness."  Principal Diagnosis: Polysubstance Induced Mood Disorder, GAD Patient Active Problem List   Diagnosis Date Noted  . Major depressive disorder, recurrent severe without psychotic features ([place]) [F33.2] 10/02/2015  . Anxiety [F41.9]   . Opiate withdrawal ([place]) [F11.23] 03/21/2015  . Substance induced mood disorder ([place]) [F19.94] 03/20/2015  . GAD (generalized anxiety disorder) [F41.1]   . Opiate abuse, continuous [F11.10] 01/07/2015  . 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 MDD (major depressive disorder), recurrent episode, severe ([place]) [F33.2] 01/06/2015  . Bullous emphysema ([place]) [J43.9] 11/30/2012  . COPD (chronic obstructive pulmonary disease) with emphysema ([place]) [J43.9] 11/02/2012  . Atypical chest pain [R07.89] 11/02/2012  . ANXIETY [F41.1] 08/01/2009  . DEPRESSION [F32.9] 08/01/2009  . ALLERGIC RHINITIS [J30.9] 08/01/2009  . PAIN IN JOINT, MULTIPLE SITES [M25.50] 08/01/2009  . BACK PAIN, LUMBAR, CHRONIC [M54.5] 08/01/2009  . HEADACHE [R51] 08/01/2009  . DIARRHEA, CHRONIC [R19.7] 08/01/2009   History of Present Illness: Brett Beck is a 40 y/o WM with long standing hx of opoid dependence and subsequent diversion but is denying any recent use of heroin or cocaine.  Patient is endorsing worsening anxiety for two months due to finances, mom passing and continued discord in the family.  He is feeling hopeless,helpless,worthless with low self esteem. He is denying any AVH, HI, paranoia or delusional thoughts. He has been having increased anxiety  along with frequent panic attacks. Patient was last at [facility] in January 2017 but reports that the seroquel and vistaril "did nothing to help me." He reports following up at [facility]  but did not continue care there. Patient reports that his living situation is contributing to his symptoms of increased depression and anxiety. He admits to buying opiates off the street for the last two weeks to "help me cope. I am stressed about my sister who keeps having psychotic symptoms. She also has four kids I have to help look after when her husband is at work. The anxiety has become very intense. I am not sleeping well. My mood is depressed. I get sweaty and my heart races when I am anxious." Patient is requesting a medication adjustment to address his symptoms. The clonidine protocol was discontinued due to low blood pressure and few reported withdrawal symptoms. Brett Beck appears motivated to follow up outpatient after discharge from the Observation Unit. Patient is requesting to be discharged later today after being observed on the new medications. Brett Beck expressed insight into how his psychosocial stressors are worsening his psychiatric symptoms. However, he will not be able to change his living arrangement anytime soon due to his sister needing his daily assistance. The patient denies any symptoms of psychosis and there is not evidence from the assessment.   Associated Signs/Symptoms: Depression Symptoms:  depressed mood, feelings of worthlessness/guilt, hopelessness, suicidal thoughts without plan, (Hypo) Manic Symptoms:  n/a Anxiety Symptoms:  Excessive Worry, Panic Symptoms, Psychotic Symptoms:  n/a PTSD Symptoms: n/a Total Time spent with patient: 30 minutes  Past Psychiatric History: See HPI  Risk to Self: Is patient at risk for suicide?: No Risk to Others:   Prior Inpatient Therapy:   Yes Prior Outpatient Therapy:  Yes  Alcohol Screening: 1. How often do you have a drink containing alcohol?: Never 9. Have you or someone else  been injured as a result of your drinking?: No 10. Has a relative or friend or a doctor or another health worker been concerned about your drinking or  suggested you cut down?: No Alcohol Use Disorder Identification Test Final Score (AUDIT): 0 Brief Intervention: Patient declined brief intervention Substance Abuse History in the last 12 months:  Yes.  opiates off the street Consequences of Substance Abuse: Denies most recent COW is a zero  Previous Psychotropic Medications: Yes  Psychological Evaluations: Yes  Past Medical History: COPD Past Medical History:  Diagnosis Date  . Chronic back pain   . Degenerative disc disease   . Emphysema/COPD ([place])   . Opiate abuse, continuous   . Pneumothorax on left     Past Surgical History:  Procedure Laterality Date  . lung inflation     left lung  . LUNG SURGERY     Dr Arlyce Dice-- left lung  . NOSE SURGERY     broken nose   Family History:  Family History  Problem Relation Age of Onset  . Alpha-1 antitrypsin deficiency Mother   . Emphysema Mother   . Asthma Mother    Family Psychiatric  History: Sister with psychiatric illness to include psychotic symptoms Social History:  History  Alcohol Use No     History  Drug Use  . Types: Oxycodone    Comment: opiates    Social History   Social History  . Marital status: Single    Spouse name: N/A  . Number of children: N/A  . Years of education: N/A   Occupational History  . N/A    Social History Main Topics  . Smoking status: Current Every Day Smoker    Packs/day: 1.50    Years: 12.00    Types: Cigarettes  . Smokeless tobacco: Never Used  . Alcohol use No  . Drug use:     Types: Oxycodone     Comment: opiates  . Sexual activity: Not Asked   Other Topics Concern  . None   Social History Narrative  . None   Additional Social History:                         Allergies:   Allergies  Allergen Reactions  . Effexor [Venlafaxine] Diarrhea  . Hydrocodone-Acetaminophen     REACTION: severe headaches  . Ibuprofen Other (See Comments)    Severe stomach issues  . Lyrica [Pregabalin] Diarrhea    Swelling in  the feet  . Neurontin [Gabapentin] Other (See Comments)    Severe stomach issues  . Nsaids Other (See Comments)    Severe stomach issues  . Tramadol Hcl     REACTION: diarrhea, abdominal pain   Lab Results:  Results for orders placed or performed during the hospital encounter of 10/01/15 (from the past 48 hour(s))  Comprehensive metabolic panel     Status: Abnormal   Collection Time: 10/01/15  3:22 PM  Result Value Ref Range   Sodium 136 135 - 145 mmol/L   Potassium 3.5 3.5 - 5.1 mmol/L   Chloride 101 101 - 111 mmol/L   CO2 26 22 - 32 mmol/L   Glucose, Bld 112 (H) 65 - 99 mg/dL   BUN 10 6 - 20 mg/dL   Creatinine, Ser 1.12 0.61 - 1.24 mg/dL   Calcium 9.4 8.9 - 10.3 mg/dL   Total Protein 7.7 6.5 - 8.1 g/dL   Albumin 4.8 3.5 - 5.0 g/dL   AST 21 15 -  41 U/L   ALT 16 (L) 17 - 63 U/L   Alkaline Phosphatase 99 38 - 126 U/L   Total Bilirubin 0.5 0.3 - 1.2 mg/dL   GFR calc non Af Amer >60 >60 mL/min   GFR calc Af Amer >60 >60 mL/min    Comment: (NOTE) The eGFR has been calculated using the CKD EPI equation. This calculation has not been validated in all clinical situations. eGFR's persistently <60 mL/min signify possible Chronic Kidney Disease.    Anion gap 9 5 - 15  Ethanol     Status: None   Collection Time: 10/01/15  3:22 PM  Result Value Ref Range   Alcohol, Ethyl (B) <5 <5 mg/dL    Comment:        LOWEST DETECTABLE LIMIT FOR SERUM ALCOHOL IS 5 mg/dL FOR MEDICAL PURPOSES ONLY   Salicylate level     Status: None   Collection Time: 10/01/15  3:22 PM  Result Value Ref Range   Salicylate Lvl <2.9 2.8 - 30.0 mg/dL  Acetaminophen level     Status: Abnormal   Collection Time: 10/01/15  3:22 PM  Result Value Ref Range   Acetaminophen (Tylenol), Serum <10 (L) 10 - 30 ug/mL    Comment:        THERAPEUTIC CONCENTRATIONS VARY SIGNIFICANTLY. A RANGE OF 10-30 ug/mL MAY BE AN EFFECTIVE CONCENTRATION FOR MANY PATIENTS. HOWEVER, SOME ARE BEST TREATED AT CONCENTRATIONS OUTSIDE  THIS RANGE. ACETAMINOPHEN CONCENTRATIONS >150 ug/mL AT 4 HOURS AFTER INGESTION AND >50 ug/mL AT 12 HOURS AFTER INGESTION ARE OFTEN ASSOCIATED WITH TOXIC REACTIONS.   cbc     Status: Abnormal   Collection Time: 10/01/15  3:22 PM  Result Value Ref Range   WBC 12.5 (H) 4.0 - 10.5 K/uL   RBC 5.19 4.22 - 5.81 MIL/uL   Hemoglobin 16.9 13.0 - 17.0 g/dL   HCT 48.1 39.0 - 52.0 %   MCV 92.7 78.0 - 100.0 fL   MCH 32.6 26.0 - 34.0 pg   MCHC 35.1 30.0 - 36.0 g/dL   RDW 13.3 11.5 - 15.5 %   Platelets 447 (H) 150 - 400 K/uL  Rapid urine drug screen (hospital performed)     Status: Abnormal   Collection Time: 10/01/15  8:32 PM  Result Value Ref Range   Opiates POSITIVE (A) NONE DETECTED   Cocaine NONE DETECTED NONE DETECTED   Benzodiazepines POSITIVE (A) NONE DETECTED   Amphetamines NONE DETECTED NONE DETECTED   Tetrahydrocannabinol NONE DETECTED NONE DETECTED   Barbiturates NONE DETECTED NONE DETECTED    Comment:        DRUG SCREEN FOR MEDICAL PURPOSES ONLY.  IF CONFIRMATION IS NEEDED FOR ANY PURPOSE, NOTIFY LAB WITHIN 5 DAYS.        LOWEST DETECTABLE LIMITS FOR URINE DRUG SCREEN Drug Class       Cutoff (ng/mL) Amphetamine      1000 Barbiturate      200 Benzodiazepine   476 Tricyclics       546 Opiates          300 Cocaine          300 THC              50     Metabolic Disorder Labs:  No results found for: HGBA1C, MPG No results found for: PROLACTIN No results found for: CHOL, TRIG, HDL, CHOLHDL, VLDL, LDLCALC  Current Medications: Current Facility-Administered Medications  Medication Dose Route Frequency Provider Last Rate Last Dose  . acetaminophen (  TYLENOL) tablet 650 mg  650 mg Oral Q6H PRN Laverle Hobby, PA-C   650 mg at 10/02/15 0216  . alum & mag hydroxide-simeth (MAALOX/MYLANTA) 200-200-20 MG/5ML suspension 30 mL  30 mL Oral PRN [name], NP      . dicyclomine (BENTYL) tablet 20 mg  20 mg Oral Q6H PRN [name], NP   20 mg at 10/02/15 0758  .  FLUoxetine (PROZAC) capsule 10 mg  10 mg Oral Daily Niel Hummer, NP      . hydrOXYzine (ATARAX/VISTARIL) tablet 50 mg  50 mg Oral Q6H PRN Niel Hummer, NP      . loperamide (IMODIUM) capsule 2 mg  2 mg Oral QID PRN [name], NP   2 mg at 10/02/15 0841  . magnesium hydroxide (MILK OF MAGNESIA) suspension 30 mL  30 mL Oral Daily PRN Laverle Hobby, PA-C      . magnesium hydroxide (MILK OF MAGNESIA) suspension 30 mL  30 mL Oral Daily PRN [name], NP      . methocarbamol (ROBAXIN) tablet 500 mg  500 mg Oral Q8H PRN [name], NP      . mometasone-formoterol (DULERA) 100-5 MCG/ACT inhaler 2 puff  2 puff Inhalation BID [name], NP   2 puff at 10/02/15 0819  . nicotine (NICODERM CQ - dosed in mg/24 hours) patch 21 mg  21 mg Transdermal Daily [name], NP   21 mg at 10/02/15 0758  . ondansetron (ZOFRAN-ODT) disintegrating tablet 4 mg  4 mg Oral Q6H PRN [name], NP   4 mg at 10/02/15 0758  . traZODone (DESYREL) tablet 100 mg  100 mg Oral QHS,MR X 1 Niel Hummer, NP       PTA Medications: Prescriptions Prior to Admission  Medication Sig Dispense Refill Last Dose  . acetaminophen (TYLENOL) 500 MG tablet Take 1,000 mg by mouth every 6 (six) hours as needed for mild pain.   09/30/2015 at Unknown time  . loperamide (IMODIUM) 2 MG capsule Take 1 capsule (2 mg total) by mouth 4 (four) times daily as needed for diarrhea or loose stools. (Patient taking differently: Take 4 mg by mouth 4 (four) times daily as needed for diarrhea or loose stools. ) 12 capsule 0 10/01/2015 at Unknown time    Musculoskeletal: Strength & Muscle Tone: within normal limits Gait & Station: normal Patient leans: N/A  Psychiatric Specialty Exam: Physical Exam  Nursing note and vitals reviewed. Constitutional: He is oriented to person, place, and time.  HENT:  Head: Normocephalic.  Eyes: Pupils are equal, round, and reactive to light.  Neurological: He is alert and oriented to person, place,  and time. No cranial nerve deficit.  Skin: Skin is warm and dry.  Psychiatric: Thought content normal. His mood appears anxious. He is agitated. Cognition and memory are impaired. He exhibits a depressed mood.    Review of Systems  Psychiatric/Behavioral: Positive for depression and substance abuse. Negative for hallucinations, memory loss and suicidal ideas. The patient is nervous/anxious and has insomnia.   All other systems reviewed and are negative.   Blood pressure (!) 83/60, pulse 67, temperature 98.6 F (37 C), temperature source Oral, resp. rate 16, height 6' 1.5" (1.867 m), weight 65.8 kg (145 lb).Body mass index is 18.87 kg/m.  General Appearance: Casual and Disheveled  Eye Contact::  Good  Speech:  Clear and Coherent  Volume:  Normal  Mood:  Anxious and Depressed  Affect:  Congruent  Thought Process:  Circumstantial  Orientation:  Full (Time, Place, and Person)  Thought Content:  Negative  Suicidal Thoughts:  No  Homicidal Thoughts:  No  Memory:  Immediate;   Fair  Judgement:  Good  Insight:  Lacking  Psychomotor Activity:  Negative  Concentration:  Fair  Recall:  [employer] of Knowledge:Fair  Language: Fair  Akathisia:  No  Handed:  Right  AIMS (if indicated):     Assets:  Communication Skills Desire for Improvement Leisure Time Physical Health Resilience  ADL's:  Intact  Cognition: WNL  Sleep:        Treatment Plan Summary: Plan Admit to Obs for crises intervention, safety and stabilization.  TTS to further make plans for disposition  Observation Level/Precautions:  Continuous Observation  Laboratory:  Chemistry panel, CBC, UDS positive for opiates  Psychotherapy: Individual   Medications:  Start Prozac 10 mg daily for depression, Increase vistaril to 50 mg every six hours prn anxiety, Trazodone 100 mg hs for insomnia   Consultations:  None  Discharge Concerns:  Will continue to self medicate with opiates  Estimated LOS: Less than 24 hours  Other:      Elmarie Shiley, NP-C 8/8/201710:02 AM

## 2015-10-02 NOTE — [facility] Counselor (Signed)
This Probation officer spoke with pt on his disposition plans. Pt informed this Probation officer that he does not have any insurance, but he is interested ingoing to Outpatient Services and obtaining other resources. This Probation officer made collateral contact with [facility] in [place] to obtain information on uninsured clients. This Probation officer was informed that this provider does accept self-pay clients and that they have a open group session  M-F; starting at [place]. Pt was receptive to this information and also was provided with information for [facility] which also has a walk in clinic from 8a-3p on M-F. Pt states that he has transportation anda stable place to live. Pt will be discharged with contact information to both of the providers listed.

## 2015-10-02 NOTE — Discharge Summary (Signed)
   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  Patient is endorsing worsening anxiety for two months due to finances, mom passing and continued discord in the family.  He is feeling hopeless,helpless,worthless with low self esteem. He is denying any AVH, HI, paranoia or delusional thoughts. He has been having increased anxiety  along with frequent panic attacks. Patient was last at [facility] in January 2017 but reports that the seroquel and vistaril "did nothing to help me." He reports following up at [facility] but did not continue care there. Patient reports that his living situation is contributing to his symptoms of increased depression and anxiety. He admits to buying opiates off the street for the last two weeks to "help me cope. I am stressed about my sister who keeps having psychotic symptoms. She also has four kids I have to help look after when her husband is at work. The anxiety has become very intense. I am not sleeping well. My mood is depressed. I get sweaty and my heart races when I am anxious." Patient is requesting a medication adjustment to address his symptoms. The clonidine protocol was discontinued due to low blood pressure and few reported withdrawal symptoms. Brett Beck appears motivated to follow up outpatient after discharge from the Observation Unit. Patient is requesting to be discharged later today after being observed on the new medications. Brett Beck expressed insight into how his psychosocial stressors are worsening his psychiatric symptoms. However, he will not be able to change his living arrangement anytime soon due to his sister needing his daily assistance. The patient denies any symptoms of psychosis and there is not evidence from the assessment.   Patient was found stable for discharge from the Observation Unit in the afternoon of 8/82017. He was  provided with prescriptions for vistaril, trazodone, and prozac. He was provided with a sample supply of medications.  Patient was assisted with arranging follow up due to not having any insurance by the Observation Unit counselor. He left [facility] in stable condition with all belongings returned to him. At time of discharge he denied any suicidal or homicidal ideation.

## 2015-10-02 NOTE — Progress Notes (Signed)
Written/verbal discharge instructions, prescriptions, sample medications and follow-up appointments given to patient with verbalization of understanding;  Patient denies suicidal and homicidal ideation. Discharged home in stable condition.  All patient belongings returned to patient at time of discharge.

## 2015-10-02 NOTE — Discharge Instructions (Signed)
Pt will also follow up with [facility] in [facility] during their open group sessions (M-F @ 9am).

## 2015-10-02 NOTE — Progress Notes (Signed)
D: Patient awake, alert and cooperative; affect flat; mood anxious/depressed;  He reports withdrawal symptoms including nausea, abdominal cramping, diarrhea.  He denies suicidal and homicidal ideation and AVH;  No self-injurious behaviors noted or reported. A:  PRN medications given for withdrawal symptoms (See MAR); emotional support provided; encouraged him to seek assistance with needs/concerns. R:  Safety maintained on unit

## 2016-01-13 ENCOUNTER — Encounter (HOSPITAL_BASED_OUTPATIENT_CLINIC_OR_DEPARTMENT_OTHER): Payer: Self-pay | Admitting: *Deleted

## 2016-01-13 ENCOUNTER — Emergency Department (HOSPITAL_BASED_OUTPATIENT_CLINIC_OR_DEPARTMENT_OTHER)
Admission: EM | Admit: 2016-01-13 | Discharge: 2016-01-13 | Disposition: A | Discharge status: Home / Self Care | Payer: Self-pay | Attending: Emergency Medicine | Admitting: Emergency Medicine

## 2016-01-13 DIAGNOSIS — J449 Chronic obstructive pulmonary disease, unspecified: Secondary | ICD-10-CM | POA: Insufficient documentation

## 2016-01-13 DIAGNOSIS — R197 Diarrhea, unspecified: Secondary | ICD-10-CM | POA: Insufficient documentation

## 2016-01-13 DIAGNOSIS — Z79899 Other long term (current) drug therapy: Secondary | ICD-10-CM | POA: Insufficient documentation

## 2016-01-13 DIAGNOSIS — R251 Tremor, unspecified: Secondary | ICD-10-CM | POA: Insufficient documentation

## 2016-01-13 DIAGNOSIS — F17210 Nicotine dependence, cigarettes, uncomplicated: Secondary | ICD-10-CM | POA: Insufficient documentation

## 2016-01-13 DIAGNOSIS — R112 Nausea with vomiting, unspecified: Secondary | ICD-10-CM | POA: Insufficient documentation

## 2016-01-13 DIAGNOSIS — M791 Myalgia: Secondary | ICD-10-CM | POA: Insufficient documentation

## 2016-01-13 LAB — BASIC METABOLIC PANEL
ANION GAP: 10 (ref 5–15)
BUN: 16 mg/dL (ref 6–20)
CHLORIDE: 99 mmol/L — AB (ref 101–111)
CO2: 25 mmol/L (ref 22–32)
Calcium: 9.5 mg/dL (ref 8.9–10.3)
Creatinine, Ser: 1.07 mg/dL (ref 0.61–1.24)
GFR calc Af Amer: >60 mL/min (ref >60)
GLUCOSE: 109 mg/dL — AB (ref 65–99)
POTASSIUM: 3.8 mmol/L (ref 3.5–5.1)
SODIUM: 134 mmol/L — AB (ref 135–145)

## 2016-01-13 LAB — CBC WITH DIFFERENTIAL/PLATELET
BASOS ABS: 0 10*3/uL (ref 0.0–0.1)
Basophils Relative: 0 %
Eosinophils Absolute: 0 10*3/uL (ref 0.0–0.7)
Eosinophils Relative: 0 %
HEMATOCRIT: 46.4 % (ref 39.0–52.0)
HEMOGLOBIN: 16.7 g/dL (ref 13.0–17.0)
LYMPHS ABS: 1.2 10*3/uL (ref 0.7–4.0)
LYMPHS PCT: 12 %
MCH: 33.3 pg (ref 26.0–34.0)
MCHC: 36 g/dL (ref 30.0–36.0)
MCV: 92.4 fL (ref 78.0–100.0)
Monocytes Absolute: 0.8 10*3/uL (ref 0.1–1.0)
Monocytes Relative: 8 %
NEUTROS ABS: 7.8 10*3/uL — AB (ref 1.7–7.7)
Neutrophils Relative %: 80 %
Platelets: 417 10*3/uL — ABNORMAL HIGH (ref 150–400)
RBC: 5.02 MIL/uL (ref 4.22–5.81)
RDW: 13.2 % (ref 11.5–15.5)
WBC: 9.9 10*3/uL (ref 4.0–10.5)

## 2016-01-13 MED ORDER — ONDANSETRON HCL 4 MG/2ML IJ SOLN
4.0000 mg | Freq: Once | INTRAMUSCULAR | Status: AC
Start: 2016-01-13 — End: 2016-01-13
  Administered 2016-01-13: 4 mg via INTRAVENOUS
  Filled 2016-01-13: qty 2

## 2016-01-13 MED ORDER — TRAZODONE HCL 150 MG PO TABS: 150.0000 mg | ORAL_TABLET | Freq: Every day | ORAL | 0 refills | Status: DC

## 2016-01-13 MED ORDER — SODIUM CHLORIDE 0.9 % IV BOLUS (SEPSIS)
1000.0000 mL | Freq: Once | INTRAVENOUS | Status: AC
  Administered 2016-01-13: 1000 mL via INTRAVENOUS

## 2016-01-13 MED ORDER — FLUOXETINE HCL 20 MG PO TABS: 40.0000 mg | ORAL_TABLET | Freq: Every day | ORAL | 0 refills | Status: DC

## 2016-01-13 MED ORDER — FLUOXETINE HCL 20 MG PO CAPS
40.0000 mg | ORAL_CAPSULE | Freq: Once | ORAL | Status: DC
  Filled 2016-01-13: qty 2

## 2016-01-13 MED ORDER — ONDANSETRON 4 MG PO TBDP: 4.0000 mg | ORAL_TABLET | Freq: Three times a day (TID) | ORAL | 0 refills | Status: DC | PRN

## 2016-01-13 NOTE — Discharge Instructions (Signed)
Your symptoms are consistent with a viral illness. Viruses do not require antibiotics. Treatment is symptomatic care and it is important to note that these symptoms may last for 7-10 days. Drink plenty of fluids and get plenty of rest. You should be drinking at least a one half to one liter of water an hour to stay hydrated. Ibuprofen, Naproxen, or Tylenol for pain or fever. Zofran for nausea. Follow up with a primary care provider, as needed, for any future management of this issue.  Your Prozac and trazodone have been refilled. You will need to follow up with a primary care provider or psychiatrist for further management of these medications.

## 2016-01-13 NOTE — ED Triage Notes (Signed)
Patient c/o n/v/d since Thursday night. He has been taking imodium but no relief. He is not aware of a fever

## 2016-01-13 NOTE — ED Provider Notes (Signed)
[place] DEPT [facility] Provider Note   CSN: [id number] Arrival date & time: 01/13/16  1034     History   Chief Complaint Chief Complaint  Patient presents with  . Emesis  . Diarrhea    HPI Brett Beck is a 40 y.o. male.  HPI   Brett Beck is a 40 y.o. male, with a history of chronic back pain and opiate abuse, presenting to the ED with Nausea, vomiting, diarrhea, tremors, and malaise for the past 4 days. Patient states 5 days ago he stopped taking his Prozac and thinks that his current symptoms may be associated. Patient stopped taking this medication because he ran out due to a medical emergency with his sister. Patient endorses about 7 episodes of vomiting and diarrhea over last 24 hours. Patient denies fever/chills, abdominal pain, shortness of breath, dizziness, or any other complaints.  Patient has medication bottles (with recent dates) that indicate he is to take 40 mg of Prozac daily and 150 mg of trazodone each night.    Past Medical History:  Diagnosis Date  . Chronic back pain   . Degenerative disc disease   . Emphysema/COPD ([place])   . Opiate abuse, continuous   . Pneumothorax on left     Patient Active Problem List   Diagnosis Date Noted  . Major depressive disorder, recurrent severe without psychotic features ([place]) 10/02/2015  . Anxiety   . Opiate withdrawal ([place]) 03/21/2015  . Substance induced mood disorder ([place]) 03/20/2015  . GAD (generalized anxiety disorder)   . Opiate abuse, continuous 01/07/2015  . Moderate benzodiazepine use disorder ([place]) 01/06/2015  . MDD (major depressive disorder), recurrent episode, severe ([place]) 01/06/2015  . Bullous emphysema ([place]) 11/30/2012  . COPD (chronic obstructive pulmonary disease) with emphysema ([place]) 11/02/2012  . Atypical chest pain 11/02/2012  . ANXIETY 08/01/2009  . DEPRESSION 08/01/2009  . ALLERGIC RHINITIS 08/01/2009  . PAIN IN JOINT, MULTIPLE SITES 08/01/2009  . BACK PAIN, LUMBAR, CHRONIC  08/01/2009  . HEADACHE 08/01/2009  . DIARRHEA, CHRONIC 08/01/2009    Past Surgical History:  Procedure Laterality Date  . lung inflation     left lung  . LUNG SURGERY     Dr Arlyce Dice-- left lung  . NOSE SURGERY     broken nose       Home Medications    Prior to Admission medications   Medication Sig Start Date End Date Taking? Authorizing Provider  FLUoxetine (PROZAC) 10 MG capsule Take 1 capsule (10 mg total) by mouth daily. 10/02/15   Niel Hummer, NP  FLUoxetine (PROZAC) 20 MG tablet Take 2 tablets (40 mg total) by mouth daily. 01/13/16 02/12/16  Jamiee Milholland C Virgie Kunda, PA-C  hydrOXYzine (ATARAX/VISTARIL) 50 MG tablet Take 1 tablet (50 mg total) by mouth every 6 (six) hours as needed for anxiety. 10/02/15   Niel Hummer, NP  loperamide (IMODIUM) 2 MG capsule Take 1 capsule (2 mg total) by mouth 4 (four) times daily as needed for diarrhea or loose stools. Patient taking differently: Take 4 mg by mouth 4 (four) times daily as needed for diarrhea or loose stools.  04/16/15   [name], MD  mometasone-formoterol [facility]) 100-5 MCG/ACT AERO Inhale 2 puffs into the lungs 2 (two) times daily. 10/02/15   Niel Hummer, NP  ondansetron (ZOFRAN ODT) 4 MG disintegrating tablet Take 1 tablet (4 mg total) by mouth every 8 (eight) hours as needed for nausea or vomiting. 01/13/16   Islah Eve C Lida Berkery, PA-C  traZODone (DESYREL)  100 MG tablet Take 1 tablet (100 mg total) by mouth at bedtime and may repeat dose one time if needed. Patient taking differently: Take 150 mg by mouth at bedtime and may repeat dose one time if needed.  10/02/15   Niel Hummer, NP  traZODone (DESYREL) 150 MG tablet Take 1 tablet (150 mg total) by mouth at bedtime. 01/13/16 02/12/16  Lorayne Bender, PA-C    Family History Family History  Problem Relation Age of Onset  . Alpha-1 antitrypsin deficiency Mother   . Emphysema Mother   . Asthma Mother     Social History Social History  Substance Use Topics  . Smoking status: Current Every Day  Smoker    Packs/day: 1.50    Years: 12.00    Types: Cigarettes  . Smokeless tobacco: Never Used  . Alcohol use No     Allergies   Effexor [venlafaxine]; Hydrocodone-acetaminophen; Ibuprofen; Lyrica [pregabalin]; Neurontin [gabapentin]; Nsaids; and Tramadol hcl   Review of Systems Review of Systems  Constitutional: Negative for chills and fever.  Respiratory: Negative for cough and shortness of breath.   Cardiovascular: Negative for chest pain.  Gastrointestinal: Positive for diarrhea, nausea and vomiting. Negative for abdominal pain.  Musculoskeletal: Positive for myalgias.  Neurological: Positive for tremors. Negative for dizziness, light-headedness and headaches.  All other systems reviewed and are negative.    Physical Exam Updated Vital Signs BP (!) 123/102 (BP Location: Left Arm)   Pulse 96   Temp 98.1 F (36.7 C) (Oral)   Resp 18   Ht 6\' 1"  (1.854 m)   Wt 63.5 kg   SpO2 100%   BMI 18.47 kg/m   Physical Exam  Constitutional: He appears well-developed and well-nourished. No distress.  HENT:  Head: Normocephalic and atraumatic.  Eyes: Conjunctivae are normal.  Neck: Neck supple.  Cardiovascular: Normal rate, regular rhythm, normal heart sounds and intact distal pulses.   Pulmonary/Chest: Effort normal and breath sounds normal. No respiratory distress.  Abdominal: Soft. There is no tenderness. There is no guarding.  Musculoskeletal: He exhibits no edema.  Lymphadenopathy:    He has no cervical adenopathy.  Neurological: He is alert.  Skin: Skin is warm and dry. He is not diaphoretic.  Psychiatric: He has a normal mood and affect. His behavior is normal.  Nursing note and vitals reviewed.    ED Treatments / Results  Labs (all labs ordered are listed, but only abnormal results are displayed) Labs Reviewed  BASIC METABOLIC PANEL - Abnormal; Notable for the following:       Result Value   Sodium 134 (*)    Chloride 99 (*)    Glucose, Bld 109 (*)    All  other components within normal limits  CBC WITH DIFFERENTIAL/PLATELET - Abnormal; Notable for the following:    Platelets 417 (*)    Neutro Abs 7.8 (*)    All other components within normal limits    EKG  EKG Interpretation None       Radiology No results found.  Procedures Procedures (including critical care time)  Medications Ordered in ED Medications  FLUoxetine (PROZAC) capsule 40 mg (40 mg Oral Not Given 01/13/16 1442)  sodium chloride 0.9 % bolus 1,000 mL (0 mLs Intravenous Stopped 01/13/16 1425)  ondansetron (ZOFRAN) injection 4 mg (4 mg Intravenous Given 01/13/16 1256)     Initial Impression / Assessment and Plan / ED Course  I have reviewed the triage vital signs and the nursing notes.  Pertinent labs & imaging  results that were available during my care of the patient were reviewed by me and considered in my medical decision making (see chart for details).  Clinical Course      Patient presents with nausea, vomiting, diarrhea for the past 4 days. Viral syndrome likely. Patient is not acutely ill appearing. Patient given refills of his Prozac and trazodone. Encouraged to follow-up with PCP. Return precautions discussed.   Vitals:   01/13/16 1100 01/13/16 1101 01/13/16 1443  BP: (!) 123/102  122/97  Pulse: 96  81  Resp: 18  16  Temp: 98.1 F (36.7 C)    TempSrc: Oral    SpO2: 100%  100%  Weight:  63.5 kg   Height:  6\' 1"  (1.854 m)      Final Clinical Impressions(s) / ED Diagnoses   Final diagnoses:  Nausea vomiting and diarrhea    New Prescriptions Discharge Medication List as of 01/13/2016  2:24 PM    START taking these medications   Details  FLUoxetine (PROZAC) 20 MG tablet Take 2 tablets (40 mg total) by mouth daily., Starting Sun 01/13/2016, Until Tue 02/12/2016, Print    ondansetron (ZOFRAN ODT) 4 MG disintegrating tablet Take 1 tablet (4 mg total) by mouth every 8 (eight) hours as needed for nausea or vomiting., Starting Sun 01/13/2016,  Print    !! traZODone (DESYREL) 150 MG tablet Take 1 tablet (150 mg total) by mouth at bedtime., Starting Sun 01/13/2016, Until Tue 02/12/2016, Print     !! - Potential duplicate medications found. Please discuss with provider.       Lorayne Bender, PA-C 01/13/16 [place], MD 01/20/16 [phone]

## 2016-01-13 NOTE — ED Notes (Signed)
[place] provided for po challenge 

## 2016-02-11 ENCOUNTER — Encounter (HOSPITAL_COMMUNITY): Payer: Self-pay | Admitting: Emergency Medicine

## 2016-02-11 ENCOUNTER — Emergency Department (HOSPITAL_COMMUNITY)
Admission: EM | Admit: 2016-02-11 | Discharge: 2016-02-12 | Disposition: A | Discharge status: Other Acute Inpatient Hospital | Payer: Federal, State, Local not specified - Other | Attending: Emergency Medicine | Admitting: Emergency Medicine

## 2016-02-11 DIAGNOSIS — Z79899 Other long term (current) drug therapy: Secondary | ICD-10-CM | POA: Insufficient documentation

## 2016-02-11 DIAGNOSIS — F1910 Other psychoactive substance abuse, uncomplicated: Secondary | ICD-10-CM | POA: Insufficient documentation

## 2016-02-11 DIAGNOSIS — R45851 Suicidal ideations: Secondary | ICD-10-CM

## 2016-02-11 DIAGNOSIS — F339 Major depressive disorder, recurrent, unspecified: Secondary | ICD-10-CM | POA: Insufficient documentation

## 2016-02-11 DIAGNOSIS — F17210 Nicotine dependence, cigarettes, uncomplicated: Secondary | ICD-10-CM | POA: Insufficient documentation

## 2016-02-11 DIAGNOSIS — F1193 Opioid use, unspecified with withdrawal: Secondary | ICD-10-CM | POA: Diagnosis present

## 2016-02-11 DIAGNOSIS — J449 Chronic obstructive pulmonary disease, unspecified: Secondary | ICD-10-CM | POA: Insufficient documentation

## 2016-02-11 DIAGNOSIS — F332 Major depressive disorder, recurrent severe without psychotic features: Secondary | ICD-10-CM | POA: Diagnosis present

## 2016-02-11 LAB — CBC
HCT: 46.3 % (ref 39.0–52.0)
Hemoglobin: 16.7 g/dL (ref 13.0–17.0)
MCH: 33.7 pg (ref 26.0–34.0)
MCHC: 36.1 g/dL — AB (ref 30.0–36.0)
MCV: 93.3 fL (ref 78.0–100.0)
PLATELETS: 508 10*3/uL — AB (ref 150–400)
RBC: 4.96 MIL/uL (ref 4.22–5.81)
RDW: 13.8 % (ref 11.5–15.5)
WBC: 12.8 10*3/uL — AB (ref 4.0–10.5)

## 2016-02-11 LAB — COMPREHENSIVE METABOLIC PANEL
ALT: 13 U/L — AB (ref 17–63)
AST: 19 U/L (ref 15–41)
Albumin: 4.4 g/dL (ref 3.5–5.0)
Alkaline Phosphatase: 94 U/L (ref 38–126)
Anion gap: 12 (ref 5–15)
BILIRUBIN TOTAL: 1 mg/dL (ref 0.3–1.2)
BUN: 12 mg/dL (ref 6–20)
CHLORIDE: 102 mmol/L (ref 101–111)
CO2: 24 mmol/L (ref 22–32)
CREATININE: 0.94 mg/dL (ref 0.61–1.24)
Calcium: 9.3 mg/dL (ref 8.9–10.3)
Glucose, Bld: 118 mg/dL — ABNORMAL HIGH (ref 65–99)
Potassium: 3.8 mmol/L (ref 3.5–5.1)
Sodium: 138 mmol/L (ref 135–145)
TOTAL PROTEIN: 7.3 g/dL (ref 6.5–8.1)

## 2016-02-11 LAB — ETHANOL

## 2016-02-11 MED ORDER — TRAZODONE HCL 50 MG PO TABS
150.0000 mg | ORAL_TABLET | Freq: Every day | ORAL | Status: DC
  Administered 2016-02-11: 21:00:00 150 mg via ORAL
  Filled 2016-02-11: qty 1

## 2016-02-11 MED ORDER — METHOCARBAMOL 500 MG PO TABS
500.0000 mg | ORAL_TABLET | Freq: Three times a day (TID) | ORAL | Status: DC | PRN
  Administered 2016-02-11 – 2016-02-12 (×2): 500 mg via ORAL
  Filled 2016-02-11 (×2): qty 1

## 2016-02-11 MED ORDER — CLONIDINE HCL 0.1 MG PO TABS: 0.1000 mg | ORAL_TABLET | Freq: Every day | ORAL | Status: DC

## 2016-02-11 MED ORDER — CLONIDINE HCL 0.1 MG PO TABS: 0.1000 mg | ORAL_TABLET | ORAL | Status: DC

## 2016-02-11 MED ORDER — CLONIDINE HCL 0.1 MG PO TABS
0.1000 mg | ORAL_TABLET | Freq: Four times a day (QID) | ORAL | Status: DC
  Administered 2016-02-11 – 2016-02-12 (×4): 0.1 mg via ORAL
  Filled 2016-02-11 (×5): qty 1

## 2016-02-11 MED ORDER — ONDANSETRON 4 MG PO TBDP
4.0000 mg | ORAL_TABLET | Freq: Four times a day (QID) | ORAL | Status: DC | PRN
  Administered 2016-02-11: 4 mg via ORAL
  Filled 2016-02-11: qty 1

## 2016-02-11 MED ORDER — HYDROXYZINE HCL 25 MG PO TABS
25.0000 mg | ORAL_TABLET | Freq: Four times a day (QID) | ORAL | Status: DC | PRN
  Administered 2016-02-11 – 2016-02-12 (×2): 25 mg via ORAL
  Filled 2016-02-11 (×2): qty 1

## 2016-02-11 MED ORDER — LOPERAMIDE HCL 2 MG PO CAPS
2.0000 mg | ORAL_CAPSULE | ORAL | Status: DC | PRN
  Administered 2016-02-11: 4 mg via ORAL
  Administered 2016-02-12 (×3): 2 mg via ORAL
  Filled 2016-02-11: qty 2
  Filled 2016-02-11 (×2): qty 1
  Filled 2016-02-11: qty 2

## 2016-02-11 MED ORDER — DICYCLOMINE HCL 20 MG PO TABS
20.0000 mg | ORAL_TABLET | Freq: Four times a day (QID) | ORAL | Status: DC | PRN
  Administered 2016-02-11 – 2016-02-12 (×2): 20 mg via ORAL
  Filled 2016-02-11 (×2): qty 1

## 2016-02-11 MED ORDER — MOMETASONE FURO-FORMOTEROL FUM 100-5 MCG/ACT IN AERO: 2.0000 | INHALATION_SPRAY | Freq: Two times a day (BID) | RESPIRATORY_TRACT | Status: DC

## 2016-02-11 MED ORDER — FLUOXETINE HCL 20 MG PO TABS
40.0000 mg | ORAL_TABLET | Freq: Every day | ORAL | Status: DC
  Administered 2016-02-12: 40 mg via ORAL
  Filled 2016-02-11 (×3): qty 2

## 2016-02-11 NOTE — ED Provider Notes (Signed)
[place] DEPT Provider Note   CSN: [id number] Arrival date & time: 02/11/16  1435     History   Chief Complaint Chief Complaint  Patient presents with  . Detox    HPI Brett Beck is a 40 y.o. male.  HPI Patient presents to the emergency room with complaints of opiate withdrawal associated with depression and suicidal ideation. Patient states he stopped taking his Prozac about 4 days ago. He also has been chronically abusing oxycodone. He stopped taking that a few days ago. Since then he developed nausea, vomiting, diarrhea, abdominal cramping. He denies any fevers or chills. He denies any chest pain or shortness of breath. Patient has been thinking about harming himself over the last couple of days. He had more active thoughts today and that is why he came into the emergency room. Past Medical History:  Diagnosis Date  . Chronic back pain   . Degenerative disc disease   . Emphysema/COPD ([place])   . Opiate abuse, continuous   . Pneumothorax on left     Patient Active Problem List   Diagnosis Date Noted  . Major depressive disorder, recurrent severe without psychotic features ([place]) 10/02/2015  . Anxiety   . Opiate withdrawal ([place]) 03/21/2015  . Substance induced mood disorder ([place]) 03/20/2015  . GAD (generalized anxiety disorder)   . Opiate abuse, continuous 01/07/2015  . Moderate benzodiazepine use disorder ([place]) 01/06/2015  . MDD (major depressive disorder), recurrent episode, severe ([place]) 01/06/2015  . Bullous emphysema ([place]) 11/30/2012  . COPD (chronic obstructive pulmonary disease) with emphysema ([place]) 11/02/2012  . Atypical chest pain 11/02/2012  . ANXIETY 08/01/2009  . DEPRESSION 08/01/2009  . ALLERGIC RHINITIS 08/01/2009  . PAIN IN JOINT, MULTIPLE SITES 08/01/2009  . BACK PAIN, LUMBAR, CHRONIC 08/01/2009  . HEADACHE 08/01/2009  . DIARRHEA, CHRONIC 08/01/2009    Past Surgical History:  Procedure Laterality Date  . lung inflation     left lung  .  LUNG SURGERY     Dr Arlyce Dice-- left lung  . NOSE SURGERY     broken nose       Home Medications    Prior to Admission medications   Medication Sig Start Date End Date Taking? Authorizing Provider  FLUoxetine (PROZAC) 10 MG capsule Take 1 capsule (10 mg total) by mouth daily. 10/02/15   Niel Hummer, NP  FLUoxetine (PROZAC) 20 MG tablet Take 2 tablets (40 mg total) by mouth daily. 01/13/16 02/12/16  Shawn C Joy, PA-C  hydrOXYzine (ATARAX/VISTARIL) 50 MG tablet Take 1 tablet (50 mg total) by mouth every 6 (six) hours as needed for anxiety. 10/02/15   Niel Hummer, NP  loperamide (IMODIUM) 2 MG capsule Take 1 capsule (2 mg total) by mouth 4 (four) times daily as needed for diarrhea or loose stools. Patient taking differently: Take 4 mg by mouth 4 (four) times daily as needed for diarrhea or loose stools.  04/16/15   [name], MD  mometasone-formoterol [facility]) 100-5 MCG/ACT AERO Inhale 2 puffs into the lungs 2 (two) times daily. 10/02/15   Niel Hummer, NP  ondansetron (ZOFRAN ODT) 4 MG disintegrating tablet Take 1 tablet (4 mg total) by mouth every 8 (eight) hours as needed for nausea or vomiting. 01/13/16   Shawn C Joy, PA-C  traZODone (DESYREL) 100 MG tablet Take 1 tablet (100 mg total) by mouth at bedtime and may repeat dose one time if needed. Patient taking differently: Take 150 mg by mouth at bedtime and may repeat dose  one time if needed.  10/02/15   Niel Hummer, NP  traZODone (DESYREL) 150 MG tablet Take 1 tablet (150 mg total) by mouth at bedtime. 01/13/16 02/12/16  Lorayne Bender, PA-C    Family History Family History  Problem Relation Age of Onset  . Alpha-1 antitrypsin deficiency Mother   . Emphysema Mother   . Asthma Mother     Social History Social History  Substance Use Topics  . Smoking status: Current Every Day Smoker    Packs/day: 1.50    Years: 12.00    Types: Cigarettes  . Smokeless tobacco: Never Used  . Alcohol use No     Allergies   Effexor [venlafaxine];  Hydrocodone-acetaminophen; Ibuprofen; Lyrica [pregabalin]; Neurontin [gabapentin]; Nsaids; and Tramadol hcl   Review of Systems Review of Systems  All other systems reviewed and are negative.    Physical Exam Updated Vital Signs BP (!) 152/122 (BP Location: Left Arm)   Pulse 102   Temp 97.9 F (36.6 C) (Oral)   Resp 18   Ht 6\' 1"  (1.854 m)   Wt 63.5 kg   SpO2 96%   BMI 18.47 kg/m   Physical Exam  Constitutional: No distress.  HENT:  Head: Normocephalic and atraumatic.  Right Ear: External ear normal.  Left Ear: External ear normal.  Eyes: Conjunctivae are normal. Right eye exhibits no discharge. Left eye exhibits no discharge. No scleral icterus.  Neck: Neck supple. No tracheal deviation present.  Cardiovascular: Normal rate, regular rhythm and intact distal pulses.   Pulmonary/Chest: Effort normal and breath sounds normal. No stridor. No respiratory distress. He has no wheezes. He has no rales.  Abdominal: Soft. Bowel sounds are normal. He exhibits no distension. There is no tenderness. There is no rebound and no guarding.  Musculoskeletal: He exhibits no edema or tenderness.  Neurological: He is alert. He has normal strength. No cranial nerve deficit (no facial droop, extraocular movements intact, no slurred speech) or sensory deficit. He exhibits normal muscle tone. He displays no seizure activity. Coordination normal.  Skin: Skin is warm and dry. No rash noted. He is not diaphoretic.  Psychiatric: His speech is not rapid and/or pressured, not delayed, not tangential and not slurred. He is withdrawn. He is not agitated and not combative. Thought content is not delusional. He exhibits a depressed mood. He expresses suicidal ideation. He expresses no homicidal ideation. He is communicative.  Nursing note and vitals reviewed.    ED Treatments / Results  Labs (all labs ordered are listed, but only abnormal results are displayed) Labs Reviewed  COMPREHENSIVE METABOLIC PANEL  - Abnormal; Notable for the following:       Result Value   Glucose, Bld 118 (*)    ALT 13 (*)    All other components within normal limits  CBC - Abnormal; Notable for the following:    WBC 12.8 (*)    MCHC 36.1 (*)    Platelets 508 (*)    All other components within normal limits  ETHANOL  RAPID URINE DRUG SCREEN, HOSP PERFORMED    EKG  EKG Interpretation None       Radiology No results found.  Procedures Procedures (including critical care time)  Medications Ordered in ED Medications  dicyclomine (BENTYL) tablet 20 mg (20 mg Oral Given 02/11/16 1558)  hydrOXYzine (ATARAX/VISTARIL) tablet 25 mg (25 mg Oral Given 02/11/16 1559)  loperamide (IMODIUM) capsule 2-4 mg (4 mg Oral Given 02/11/16 1600)  methocarbamol (ROBAXIN) tablet 500 mg (500 mg Oral  Given 02/11/16 1558)  ondansetron (ZOFRAN-ODT) disintegrating tablet 4 mg (4 mg Oral Given 02/11/16 1557)  cloNIDine (CATAPRES) tablet 0.1 mg (0.1 mg Oral Given 02/11/16 1547)    Followed by  cloNIDine (CATAPRES) tablet 0.1 mg (not administered)    Followed by  cloNIDine (CATAPRES) tablet 0.1 mg (not administered)  FLUoxetine (PROZAC) tablet 40 mg (not administered)  mometasone-formoterol (DULERA) 100-5 MCG/ACT inhaler 2 puff (not administered)  traZODone (DESYREL) tablet 150 mg (not administered)     Initial Impression / Assessment and Plan / ED Course  I have reviewed the triage vital signs and the nursing notes.  Pertinent labs & imaging results that were available during my care of the patient were reviewed by me and considered in my medical decision making (see chart for details).  Clinical Course as of Feb 10 1706  Mon Feb 11, 2016  1531 Appears stable.  Will check labs.  Treat for opiate withdrawal.  With his SI complaints will plan on psych consult.  [id number] Labs reviewed.  Pt is medically stable.  BP elevated but will monitor.  Clonidine for withdrwal should help with that. Will consult tts.    [JK]      Clinical Course User Index [JK] Dorie Rank, MD    Patient presented to the emergency room with complaints of suicidal ideation and opiate withdrawal. Patient appears medically stable in the emergency room. Medications were ordered to help him with his withdrawal symptoms. I'll consult with TTS considering his suicidal ideation. Final Clinical Impressions(s) / ED Diagnoses   Final diagnoses:  Suicidal ideation  Substance abuse      Dorie Rank, MD 02/11/16 [phone]

## 2016-02-11 NOTE — Progress Notes (Signed)
EDCM spoke to patient at bedside. Patient confirms he does not have a pcp or insurance living in [place].  [facility] provide patient with contact information to [facility], informed patient of services there.  EDCM also provided patient with list of pcps who accept self pay patients, list of discount pharmacies and websites needymeds.org and GoodRX.com for medication assistance, phone number to inquire about the orange card, phone number to inquire about Medicaid, phone number to inquire about the [place], financial resources in the community such as local churches, [employer], urban ministries, and dental assistance for uninsured patients.  .  No further EDCM needs at this time.  Patient asleep at time of [facility] visit.  EDCM placed the above information in patient's belongings bag at bedside.

## 2016-02-11 NOTE — ED Notes (Signed)
Called Pharmacy about pts Prozac-awaiting verification, then will administer.

## 2016-02-11 NOTE — ED Triage Notes (Signed)
Pt request detox. Pt last opiate use 1200 yesterday. Pt complaint of flu like symptoms: n/v/d, sweating, cramping.

## 2016-02-11 NOTE — ED Notes (Signed)
Patient arrived to unit from TCU. Pt pleasant and cooperative on assessment. Pt denies suicidal/homicidal ideations at this time. Pt provided a soda and sandwich upon request. No distress noted.

## 2016-02-11 NOTE — ED Notes (Addendum)
Pt aware urine needed for UA. Unable to provide at this time.

## 2016-02-11 NOTE — BH Assessment (Addendum)
Tele Assessment Note   Brett Beck is an 40 y.o. male.  -Clinician reviewed note from Dr. Dorie Rank.  Patient presents to the emergency room with complaints of opiate withdrawal associated with depression and suicidal ideation. Patient states he stopped taking his Prozac about 4 days ago. He also has been chronically abusing oxycodone. He stopped taking that a few days ago. Since then he developed nausea, vomiting, diarrhea, abdominal cramping. He denies any fevers or chills. He denies any chest pain or shortness of breath. Patient has been thinking about harming himself over the last couple of days. He had more active thoughts today and that is why he came into the emergency room.  Patient says that he has been abusing oxycodone (taken orally) increasingly over the last 2 weeks.  He became concerned about his safety and drove himself to [facility].  Patient says he has been having suicidal thoughts.  When asked how he thought about killing himself patient says "I'd rather not get into that."  Patient however says he did not feel safe by himself and feared he would try to kill himself.  Patient has had one previous suicidal gesture.  Patient denies any HI or A/V hallucinations.  Patient says he has been getting oxycodone off the street for the past few years.  He has been using at the rate of 236m daily for the last two weeks.  Last use was Sunday (12/17) around noon.  Pt denies use of ETOH or other illicit drugs.  Patient says he has been depressed and anxious lately.  He has been off his Prozac (unsure of dosage) since last Wednesday (02/06/16).  He [place] but it got rescheduled for 02/14/16.  Pt is calm and cooperative at this time.  [facility]  [facility]  Patient has been with current psychiatrist for the last 4 months.  -Clinician discussed  patient care with SPatriciaann Clan PA who said that patient met inpatient care criteria.  [facility] concern for patient's initial BP.  Diagnosis: MDD recurrent, severe; Opiate use d/o severe  Past Medical History:  Past Medical History:  Diagnosis Date  . Chronic back pain   . Degenerative disc disease   . [place]   . Opiate abuse, continuous   . Pneumothorax on left     Past Surgical History:  Procedure Laterality Date  . lung inflation     left lung  . LUNG SURGERY     Dr BArlyce Dice- left lung  . NOSE SURGERY     broken nose    Family History:  Family History  Problem Relation Age of Onset  . Alpha-1 antitrypsin deficiency Mother   . Emphysema Mother   . Asthma Mother     Social History:  reports that he has been smoking Cigarettes.  He has a 18.00 pack-year smoking history. He has never used smokeless tobacco. He reports that he uses drugs, including Oxycodone. He reports that he does not drink alcohol.  Additional Social History:  Alcohol / Drug Use Pain Medications: Oxycodone 2073min a day lately (none prescribed) taking orally. Prescriptions: Prozac (two pills but pt has not been on it since 02/07/16); also Trazadone 15030mt night. Over the Counter: None History of alcohol / drug use?: Yes Negative  Consequences of Use: Financial Withdrawal Symptoms: Diarrhea, Nausea / Vomiting, Cramps, Fever / Chills, Sweats, Patient aware of relationship between substance abuse and physical/medical complications, Weakness Substance #1 Name of Substance 1: Oxycodone 1 - Age of First Use: 40 years old.  Had been prescribed post surgery. 1 - Amount (size/oz): 240m per day for the last couple of weeks. 1 - Frequency: Daily use 1 - Duration: Last two weeks at that dose. 1 - Last Use / Amount: 12/17 around noon.  CIWA: CIWA-Ar BP: (!) 152/122 Pulse Rate: 102 COWS:    PATIENT STRENGTHS: (choose at least two) Ability for  insight Average or above average intelligence Capable of independent living Communication skills Supportive family/friends  Allergies:  Allergies  Allergen Reactions  . Effexor [Venlafaxine] Diarrhea  . Hydrocodone-Acetaminophen     REACTION: severe headaches  . Ibuprofen Other (See Comments)    Severe stomach issues  . Lyrica [Pregabalin] Diarrhea    Swelling in the feet  . Neurontin [Gabapentin] Other (See Comments)    Severe stomach issues  . Nsaids Other (See Comments)    Severe stomach issues  . Tramadol Hcl     REACTION: diarrhea, abdominal pain    Home Medications:  (Not in a hospital admission)  OB/GYN Status:  No LMP for male patient.  General Assessment Data [facility] of Assessment: [facility] TTS Assessment: In system Is this a Tele or Face-to-Face Assessment?: Face-to-Face Is this an Initial Assessment or a Re-assessment for this encounter?: Initial Assessment Marital status: Single Is patient pregnant?: No Pregnancy Status: No Living Arrangements: Other relatives (Living with sister currently.) Can pt return to current living arrangement?: Yes Admission Status: Voluntary Is patient capable of signing voluntary admission?: Yes Referral Source: Self/Family/Friend Insurance type: Self pay     Crisis Care Plan Living Arrangements: Other relatives (Living with sister currently.) [facility] Psych[facility] [place] Name of Therapist: None  Education Status Is patient currently in school?: No Highest grade of school patient has completed: Some college  Risk to self with the past 6 months Suicidal Ideation: Yes-Currently Present Has patient been a risk to self within the past 6 months prior to admission? : Yes Suicidal Intent: No-Not Currently/Within Last 6 Months Has patient had any suicidal intent within the past 6 months prior to admission? : Yes Is patient at risk for suicide?: Yes Suicidal Plan?: No-Not Currently/Within Last 6 Months  (Pt does not want to go into what plan he had earlier today.) Has patient had any suicidal plan within the past 6 months prior to admission? : Yes Access to Means: Yes Specify Access to Suicidal Means: Unsure, pt did not divulge plan. What has been your use of drugs/alcohol within the last 12 months?: Opiates Previous Attempts/Gestures: Yes How many times?: 1 Other Self Harm Risks: None Triggers for Past Attempts: Unpredictable Intentional Self Injurious Behavior: None Family Suicide History: No Recent stressful life event(s): Recent negative physical changes (Opiate dependence) Persecutory voices/beliefs?: No Depression: Yes Depression Symptoms: Despondent, Insomnia, Isolating, Loss of interest in usual pleasures, Feeling worthless/self pity Substance abuse history and/or treatment for substance abuse?: Yes Suicide prevention information given to non-admitted patients: Not applicable  Risk to Others within the past 6 months Homicidal Ideation: No Does patient have any lifetime risk of violence toward others beyond the six months prior to admission? : No Thoughts of Harm to Others: No Current Homicidal Intent: No Current Homicidal Plan: No Access to Homicidal Means: No Identified Victim: No one  History of harm to others?: No Assessment of Violence: None Noted Violent Behavior Description: None reported Does patient have access to weapons?: No Criminal Charges Pending?: Yes Describe Pending Criminal Charges: speeding ticket Does patient have a court date: Yes Court Date:  (In January) Is patient on probation?: No  Psychosis Hallucinations: None noted Delusions: None noted  Mental Status Report Appearance/Hygiene: Disheveled, Poor hygiene, In scrubs Eye Contact: Good Motor Activity: Freedom of movement, Unremarkable Speech: Logical/coherent, Soft Level of Consciousness: Alert Mood: Depressed, Helpless, Despair Affect: Sad Anxiety Level: Panic Attacks Panic attack  frequency: couple times per day over the weekend Most recent panic attack: Yesterday (12/17) Thought Processes: Coherent, Relevant Judgement: Unimpaired Orientation: Place, Person, Time, Situation Obsessive Compulsive Thoughts/Behaviors: None  Cognitive Functioning Concentration: Poor Memory: Recent Intact, Remote Intact IQ: Average Insight: Good Impulse Control: Fair Appetite: Poor Weight Loss:  (Poor appetite for last 3-4 days.) Weight Gain: 0 Sleep: Decreased Total Hours of Sleep:  (<4H/D) Vegetative Symptoms: Staying in bed, Decreased grooming  ADLScreening [facility] Assessment Services) Patient's cognitive ability adequate to safely complete daily activities?: Yes Patient able to express need for assistance with ADLs?: Yes Independently performs ADLs?: Yes (appropriate for developmental age)  Prior Inpatient Therapy Prior Inpatient Therapy: Yes Prior Therapy Dates: Jan, August '17; couple Februarys ago Prior Therapy Facilty/Provider(s): [facility]; Recruitment consultant Reason for Treatment: SA/SI  Prior Outpatient Therapy Prior Outpatient Therapy: Yes Prior Therapy Dates: Last 4 months  Prior Therapy Facilty/Provider(s): Dr. Dareen Piano in [place] Reason for Treatment: med management Does patient have an ACCT team?: No Does patient have Intensive In-House Services?  : No Does patient have [facility] services? : No Does patient have P4CC services?: No  ADL Screening (condition at time of admission) Patient's cognitive ability adequate to safely complete daily activities?: Yes Is the patient deaf or have difficulty hearing?: No Does the patient have difficulty seeing, even when wearing glasses/contacts?: No Does the patient have difficulty concentrating, remembering, or making decisions?: No Patient able to express need for assistance with ADLs?: Yes Does the patient have difficulty dressing or bathing?: No Independently performs ADLs?: Yes (appropriate for developmental age) Does the  patient have difficulty walking or climbing stairs?: No Weakness of Legs: None Weakness of Arms/Hands: None       Abuse/Neglect Assessment (Assessment to be complete while patient is alone) Physical Abuse: Denies Verbal Abuse: Denies Sexual Abuse: Denies Exploitation of patient/patient's resources: Denies Self-Neglect: Denies     Regulatory affairs officer (For Healthcare) Does Patient Have a Medical Advance Directive?: No    Additional Information 1:1 In Past 12 Months?: No CIRT Risk: No Elopement Risk: No Does patient have medical clearance?: Yes     Disposition:  Disposition Initial Assessment Completed for this Encounter: Yes Disposition of Patient: Other dispositions Other disposition(s): Other (Comment) (Pt to be reviewed with PA)  Raymondo Band 02/11/2016 8:31 PM

## 2016-02-12 ENCOUNTER — Inpatient Hospital Stay (HOSPITAL_COMMUNITY)
Admission: AD | Admit: 2016-02-12 | Discharge: 2016-02-15 | DRG: 885 | Disposition: A | Discharge status: Home / Self Care | Payer: Federal, State, Local not specified - Other | Source: Intra-hospital | Attending: Psychiatry | Admitting: Psychiatry

## 2016-02-12 DIAGNOSIS — F17210 Nicotine dependence, cigarettes, uncomplicated: Secondary | ICD-10-CM | POA: Diagnosis present

## 2016-02-12 DIAGNOSIS — F419 Anxiety disorder, unspecified: Secondary | ICD-10-CM | POA: Diagnosis not present

## 2016-02-12 DIAGNOSIS — Z885 Allergy status to narcotic agent status: Secondary | ICD-10-CM

## 2016-02-12 DIAGNOSIS — M545 Low back pain: Secondary | ICD-10-CM | POA: Diagnosis present

## 2016-02-12 DIAGNOSIS — F1123 Opioid dependence with withdrawal: Secondary | ICD-10-CM | POA: Diagnosis present

## 2016-02-12 DIAGNOSIS — F1193 Opioid use, unspecified with withdrawal: Secondary | ICD-10-CM | POA: Diagnosis present

## 2016-02-12 DIAGNOSIS — Z886 Allergy status to analgesic agent status: Secondary | ICD-10-CM

## 2016-02-12 DIAGNOSIS — J449 Chronic obstructive pulmonary disease, unspecified: Secondary | ICD-10-CM | POA: Diagnosis present

## 2016-02-12 DIAGNOSIS — F332 Major depressive disorder, recurrent severe without psychotic features: Secondary | ICD-10-CM

## 2016-02-12 DIAGNOSIS — G8929 Other chronic pain: Secondary | ICD-10-CM | POA: Diagnosis present

## 2016-02-12 DIAGNOSIS — Z79899 Other long term (current) drug therapy: Secondary | ICD-10-CM

## 2016-02-12 DIAGNOSIS — F411 Generalized anxiety disorder: Secondary | ICD-10-CM | POA: Diagnosis not present

## 2016-02-12 DIAGNOSIS — Z888 Allergy status to other drugs, medicaments and biological substances status: Secondary | ICD-10-CM

## 2016-02-12 DIAGNOSIS — R45851 Suicidal ideations: Secondary | ICD-10-CM

## 2016-02-12 DIAGNOSIS — Z825 Family history of asthma and other chronic lower respiratory diseases: Secondary | ICD-10-CM

## 2016-02-12 DIAGNOSIS — F1110 Opioid abuse, uncomplicated: Secondary | ICD-10-CM | POA: Diagnosis not present

## 2016-02-12 DIAGNOSIS — G4700 Insomnia, unspecified: Secondary | ICD-10-CM | POA: Diagnosis present

## 2016-02-12 DIAGNOSIS — Z8489 Family history of other specified conditions: Secondary | ICD-10-CM

## 2016-02-12 DIAGNOSIS — F1994 Other psychoactive substance use, unspecified with psychoactive substance-induced mood disorder: Secondary | ICD-10-CM | POA: Diagnosis not present

## 2016-02-12 MED ORDER — ACETAMINOPHEN 500 MG PO TABS
1000.0000 mg | ORAL_TABLET | Freq: Four times a day (QID) | ORAL | Status: DC | PRN
  Administered 2016-02-12: 1000 mg via ORAL
  Filled 2016-02-12: qty 2

## 2016-02-12 MED ORDER — NICOTINE 21 MG/24HR TD PT24
21.0000 mg | MEDICATED_PATCH | Freq: Once | TRANSDERMAL | Status: DC
  Administered 2016-02-12: 21 mg via TRANSDERMAL
  Filled 2016-02-12: qty 1

## 2016-02-12 MED ORDER — FLUOXETINE HCL 20 MG PO CAPS: 30.0000 mg | ORAL_CAPSULE | Freq: Every day | ORAL | Status: DC

## 2016-02-12 NOTE — ED Notes (Signed)
Patient resting in bed and is aware he will be transported to [facility] this shift. Pt accepting of admission. Pt with complaints of diarrhea; medication given as ordered. No distress noted at this time.

## 2016-02-12 NOTE — ED Notes (Signed)
Imodium effective.

## 2016-02-12 NOTE — ED Notes (Signed)
SBP <96. Held clonidine. Gatorade given. Pt not symptomatic, but he wants the clonidine for withdrawal.

## 2016-02-12 NOTE — BH Assessment (Signed)
[place] Assessment Progress Note  Per Corena Pilgrim, MD, this pt requires psychiatric hospitalization at this time.  Letitia Libra, RN, [facility] has assigned pt to [facility] Rm 301-2.  Pt has signed Voluntary Admission and Consent for Treatment, as well as Consent to Release Information to Dr Dareen Piano, his outpatient provider at [facility] in [place], and a notification call has been placed.  Signed forms have been faxed to [facility].  Pt's nurse, Diane, has been notified, and agrees to send original paperwork along with pt via B[facility]y Pries, and to call report to [phone].  Jalene Mullet, [place] Triage Specialist [phone]

## 2016-02-12 NOTE — Consult Note (Addendum)
[place] Psychiatry Consult   Reason for Consult:  Suicidal ideations with a plan Referring Physician:  EDP Patient Identification: Brett Beck MRN:  [id number] Principal Diagnosis: Major depressive disorder, recurrent severe without psychotic features [facility]) Diagnosis:   Patient Active Problem List   Diagnosis Date Noted  . Major depressive disorder, recurrent severe without psychotic features ([place]) [F33.2] 10/02/2015    Priority: High  . Opiate abuse, continuous [F11.10] 01/07/2015    Priority: High  . Anxiety [F41.9]   . Opiate withdrawal ([place]) [F11.23] 03/21/2015  . Substance induced mood disorder ([place]) [F19.94] 03/20/2015  . GAD (generalized anxiety disorder) [F41.1]   . 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 Bullous emphysema ([place]) [J43.9] 11/30/2012  . COPD (chronic obstructive pulmonary disease) with emphysema ([place]) [J43.9] 11/02/2012  . Atypical chest pain [R07.89] 11/02/2012  . ANXIETY [F41.1] 08/01/2009  . DEPRESSION [F32.9] 08/01/2009  . ALLERGIC RHINITIS [J30.9] 08/01/2009  . PAIN IN JOINT, MULTIPLE SITES [M25.50] 08/01/2009  . BACK PAIN, LUMBAR, CHRONIC [M54.5] 08/01/2009  . HEADACHE [R51] 08/01/2009  . DIARRHEA, CHRONIC [R19.7] 08/01/2009    Total Time spent with patient: 45 minutes  Subjective:   Brett Beck is a 40 y.o. male patient admitted with suicidal ideations and a plan to overdose.  HPI:  40 yo male who presented to the ED after trying to detox from oxycodone.  He began to have suicidal ideations with a plan to overdose due to the withdrawal symptoms.  Brett Beck is experiencing chills, sweats, and body aches.  He had an appointment with his psychiatrist in 4 days but ran out of his Prozac two days ago.  Brett Beck does state the Prozac helps but unfortunately when he is in withdrawal "my minds goes to suicide."  No homicidal ideations or hallucinations.    Past Psychiatric History: substance abuse, depression  Risk to  Self: Suicidal Ideation: Yes-Currently Present Suicidal Intent: No-Not Currently/Within Last 6 Months Is patient at risk for suicide?: Yes Suicidal Plan?: No-Not Currently/Within Last 6 Months (Pt does not want to go into what plan he had earlier today.) Access to Means: Yes Specify Access to Suicidal Means: Unsure, pt did not divulge plan. What has been your use of drugs/alcohol within the last 12 months?: Opiates How many times?: 1 Other Self Harm Risks: None Triggers for Past Attempts: Unpredictable Intentional Self Injurious Behavior: None Risk to Others: Homicidal Ideation: No Thoughts of Harm to Others: No Current Homicidal Intent: No Current Homicidal Plan: No Access to Homicidal Means: No Identified Victim: No one History of harm to others?: No Assessment of Violence: None Noted Violent Behavior Description: None reported Does patient have access to weapons?: No Criminal Charges Pending?: Yes Describe Pending Criminal Charges: speeding ticket Does patient have a court date: Yes Court Date:  (In January) Prior Inpatient Therapy: Prior Inpatient Therapy: Yes Prior Therapy Dates: Jan, August '17; couple Februarys ago Prior Therapy Facilty/Provider(s): [facility]; Recruitment consultant Reason for Treatment: SA/SI Prior Outpatient Therapy: Prior Outpatient Therapy: Yes Prior Therapy Dates: Last 4 months  Prior Therapy Facilty/Provider(s): Dr. Dareen Piano in [place] Reason for Treatment: med management Does patient have an ACCT team?: No Does patient have Intensive In-House Services?  : No Does patient have [facility] services? : No Does patient have P4CC services?: No  Past Medical History:  Past Medical History:  Diagnosis Date  . Chronic back pain   . Degenerative disc disease   . Emphysema/COPD ([place])   . Opiate abuse, continuous   . Pneumothorax  on left     Past Surgical History:  Procedure Laterality Date  . lung inflation     left lung  . LUNG SURGERY     Dr Arlyce Dice-- left  lung  . NOSE SURGERY     broken nose   Family History:  Family History  Problem Relation Age of Onset  . Alpha-1 antitrypsin deficiency Mother   . Emphysema Mother   . Asthma Mother    Family Psychiatric  History: none Social History:  History  Alcohol Use No     History  Drug Use  . Types: Oxycodone    Comment: opiates    Social History   Social History  . Marital status: Single    Spouse name: N/A  . Number of children: N/A  . Years of education: N/A   Occupational History  . N/A    Social History Main Topics  . Smoking status: Current Every Day Smoker    Packs/day: 1.50    Years: 12.00    Types: Cigarettes  . Smokeless tobacco: Never Used  . Alcohol use No  . Drug use:     Types: Oxycodone     Comment: opiates  . Sexual activity: Not Asked   Other Topics Concern  . None   Social History Narrative  . None   Additional Social History:    Allergies:   Allergies  Allergen Reactions  . Effexor [Venlafaxine] Diarrhea  . Hydrocodone-Acetaminophen     REACTION: severe headaches  . Ibuprofen Other (See Comments)    Severe stomach issues  . Lyrica [Pregabalin] Diarrhea    Swelling in the feet  . Neurontin [Gabapentin] Other (See Comments)    Severe stomach issues  . Nsaids Other (See Comments)    Severe stomach issues  . Tramadol Hcl     REACTION: diarrhea, abdominal pain    Labs:  Results for orders placed or performed during the hospital encounter of 02/11/16 (from the past 48 hour(s))  Comprehensive metabolic panel     Status: Abnormal   Collection Time: 02/11/16  3:20 PM  Result Value Ref Range   Sodium 138 135 - 145 mmol/L   Potassium 3.8 3.5 - 5.1 mmol/L   Chloride 102 101 - 111 mmol/L   CO2 24 22 - 32 mmol/L   Glucose, Bld 118 (H) 65 - 99 mg/dL   BUN 12 6 - 20 mg/dL   Creatinine, Ser 0.94 0.61 - 1.24 mg/dL   Calcium 9.3 8.9 - 10.3 mg/dL   Total Protein 7.3 6.5 - 8.1 g/dL   Albumin 4.4 3.5 - 5.0 g/dL   AST 19 15 - 41 U/L   ALT 13  (L) 17 - 63 U/L   Alkaline Phosphatase 94 38 - 126 U/L   Total Bilirubin 1.0 0.3 - 1.2 mg/dL   GFR calc non Af Amer >60 >60 mL/min   GFR calc Af Amer >60 >60 mL/min    Comment: (NOTE) The eGFR has been calculated using the CKD EPI equation. This calculation has not been validated in all clinical situations. eGFR's persistently <60 mL/min signify possible Chronic Kidney Disease.    Anion gap 12 5 - 15  Ethanol     Status: None   Collection Time: 02/11/16  3:20 PM  Result Value Ref Range   Alcohol, Ethyl (B) <5 <5 mg/dL    Comment:        LOWEST DETECTABLE LIMIT FOR SERUM ALCOHOL IS 5 mg/dL FOR MEDICAL PURPOSES ONLY   cbc  Status: Abnormal   Collection Time: 02/11/16  3:20 PM  Result Value Ref Range   WBC 12.8 (H) 4.0 - 10.5 K/uL   RBC 4.96 4.22 - 5.81 MIL/uL   Hemoglobin 16.7 13.0 - 17.0 g/dL   HCT 46.3 39.0 - 52.0 %   MCV 93.3 78.0 - 100.0 fL   MCH 33.7 26.0 - 34.0 pg   MCHC 36.1 (H) 30.0 - 36.0 g/dL   RDW 13.8 11.5 - 15.5 %   Platelets 508 (H) 150 - 400 K/uL    Current Facility-Administered Medications  Medication Dose Route Frequency Provider Last Rate Last Dose  . cloNIDine (CATAPRES) tablet 0.1 mg  0.1 mg Oral QID Dorie Rank, MD   0.1 mg at 02/11/16 2106   Followed by  . [START ON 02/13/2016] cloNIDine (CATAPRES) tablet 0.1 mg  0.1 mg Oral BH-qamhs Dorie Rank, MD       Followed by  . [START ON 02/16/2016] cloNIDine (CATAPRES) tablet 0.1 mg  0.1 mg Oral QAC breakfast Dorie Rank, MD      . dicyclomine (BENTYL) tablet 20 mg  20 mg Oral Q6H PRN Dorie Rank, MD   20 mg at 02/12/16 [phone]  . FLUoxetine (PROZAC) tablet 40 mg  40 mg Oral Daily Dorie Rank, MD      . hydrOXYzine (ATARAX/VISTARIL) tablet 25 mg  25 mg Oral Q6H PRN Dorie Rank, MD   25 mg at 02/12/16 0839  . loperamide (IMODIUM) capsule 2-4 mg  2-4 mg Oral PRN Dorie Rank, MD   2 mg at 02/12/16 6759  . methocarbamol (ROBAXIN) tablet 500 mg  500 mg Oral Q8H PRN Dorie Rank, MD   500 mg at 02/12/16 0839  . ondansetron  (ZOFRAN-ODT) disintegrating tablet 4 mg  4 mg Oral Q6H PRN Dorie Rank, MD   4 mg at 02/11/16 1557  . traZODone (DESYREL) tablet 150 mg  150 mg Oral QHS Dorie Rank, MD   150 mg at 02/11/16 2106   Current Outpatient Prescriptions  Medication Sig Dispense Refill  . FLUoxetine (PROZAC) 20 MG tablet Take 2 tablets (40 mg total) by mouth daily. 60 tablet 0  . loperamide (IMODIUM) 2 MG capsule Take 1 capsule (2 mg total) by mouth 4 (four) times daily as needed for diarrhea or loose stools. (Patient taking differently: Take 4 mg by mouth 4 (four) times daily as needed for diarrhea or loose stools. ) 12 capsule 0  . ondansetron (ZOFRAN ODT) 4 MG disintegrating tablet Take 1 tablet (4 mg total) by mouth every 8 (eight) hours as needed for nausea or vomiting. 20 tablet 0  . traZODone (DESYREL) 150 MG tablet Take 1 tablet (150 mg total) by mouth at bedtime. 30 tablet 0  . FLUoxetine (PROZAC) 10 MG capsule Take 1 capsule (10 mg total) by mouth daily. (Patient not taking: Reported on 02/11/2016) 30 capsule 0  . hydrOXYzine (ATARAX/VISTARIL) 50 MG tablet Take 1 tablet (50 mg total) by mouth every 6 (six) hours as needed for anxiety. (Patient not taking: Reported on 02/11/2016) 30 tablet 0  . mometasone-formoterol (DULERA) 100-5 MCG/ACT AERO Inhale 2 puffs into the lungs 2 (two) times daily. (Patient not taking: Reported on 02/11/2016)    . traZODone (DESYREL) 100 MG tablet Take 1 tablet (100 mg total) by mouth at bedtime and may repeat dose one time if needed. (Patient not taking: Reported on 02/11/2016) 30 tablet 0    Musculoskeletal: Strength & Muscle Tone: within normal limits Gait & Station: normal Patient leans: N/A  Psychiatric Specialty Exam: Physical Exam  Constitutional: He is oriented to person, place, and time. He appears well-developed and well-nourished.  HENT:  Head: Normocephalic.  Neck: Normal range of motion.  Respiratory: Effort normal.  Musculoskeletal: Normal range of motion.   Neurological: He is alert and oriented to person, place, and time.  Psychiatric: His speech is normal and behavior is normal. Judgment normal. Cognition and memory are normal. He exhibits a depressed mood. He expresses suicidal ideation. He expresses suicidal plans.    Review of Systems  Constitutional: Positive for chills and diaphoresis.  HENT: Negative.   Eyes: Negative.   Respiratory: Negative.   Cardiovascular: Negative.   Gastrointestinal: Negative.   Genitourinary: Negative.   Musculoskeletal: Positive for myalgias.  Skin: Negative.   Neurological: Negative.   Endo/Heme/Allergies: Negative.   Psychiatric/Behavioral: Positive for depression, substance abuse and suicidal ideas.    Blood pressure 135/92, pulse 98, temperature 99.5 F (37.5 C), temperature source Oral, resp. rate 18, height 6' 1"  (1.854 m), weight 63.5 kg (140 lb), SpO2 96 %.Body mass index is 18.47 kg/m.  General Appearance: Disheveled  Eye Contact:  Fair  Speech:  Normal Rate  Volume:  Decreased  Mood:  Depressed  Affect:  Congruent  Thought Process:  Coherent and Descriptions of Associations: Intact  Orientation:  Full (Time, Place, and Person)  Thought Content:  Rumination  Suicidal Thoughts:  Yes.  with intent/plan  Homicidal Thoughts:  No  Memory:  Immediate;   Fair Recent;   Fair Remote;   Fair  Judgement:  Impaired  Insight:  Fair  Psychomotor Activity:  Decreased  Concentration:  Concentration: Fair and Attention Span: Fair  Recall:  [employer] of Knowledge:  Fair  Language:  Good  Akathisia:  No  Handed:  Right  AIMS (if indicated):     Assets:  Leisure Time Resilience  ADL's:  Intact  Cognition:  WNL  Sleep:        Treatment Plan Summary: Daily contact with patient to assess and evaluate symptoms and progress in treatment, Medication management and Plan major depressive disorder,recurrent, severe without psychosis:  -Crisis stabilization -Medication management:  Clonidine Opiate  Withdrawal protocol started along with Prozac 40 mg daily for depression and Trazodone 150 mg at bedtime for sleep PRN -Individual and substance abuse counseling  Disposition: Recommend psychiatric Inpatient admission when medically cleared.  Brett Boga, NP 02/12/2016 10:18 AM  Patient seen face-to-face for psychiatric evaluation, chart reviewed and case discussed with the physician extender and developed treatment plan. Reviewed the information documented and agree with the treatment plan. Corena Pilgrim, MD

## 2016-02-12 NOTE — ED Notes (Signed)
Introduced self to patient. Pt oriented to unit expectations.  Assessed pt for:  A) Anxiety &/or agitation: Pt is anxious and having withdrawal symptoms. Medications have been given to decrease symptoms. He is staying in bed and is cooperative.   S) Safety: Safety maintained with q-15-minute checks and hourly rounds by staff.  A) ADLs: Pt able to perform ADLs independently.  P) Pick-Up (room cleanliness): Pt's room clean and free of clutter.

## 2016-02-12 NOTE — Progress Notes (Signed)
02/12/16 1344: Pt was sleep during recreation time.   Victorino Sparrow, LRT/CTRS

## 2016-02-13 ENCOUNTER — Encounter (HOSPITAL_COMMUNITY): Payer: Self-pay | Admitting: *Deleted

## 2016-02-13 DIAGNOSIS — Z79899 Other long term (current) drug therapy: Secondary | ICD-10-CM

## 2016-02-13 DIAGNOSIS — F332 Major depressive disorder, recurrent severe without psychotic features: Secondary | ICD-10-CM | POA: Diagnosis present

## 2016-02-13 DIAGNOSIS — F419 Anxiety disorder, unspecified: Secondary | ICD-10-CM

## 2016-02-13 DIAGNOSIS — F1123 Opioid dependence with withdrawal: Secondary | ICD-10-CM

## 2016-02-13 DIAGNOSIS — Z8489 Family history of other specified conditions: Secondary | ICD-10-CM

## 2016-02-13 DIAGNOSIS — Z825 Family history of asthma and other chronic lower respiratory diseases: Secondary | ICD-10-CM

## 2016-02-13 DIAGNOSIS — Z888 Allergy status to other drugs, medicaments and biological substances status: Secondary | ICD-10-CM

## 2016-02-13 MED ORDER — CLONIDINE HCL 0.1 MG PO TABS
0.1000 mg | ORAL_TABLET | Freq: Four times a day (QID) | ORAL | Status: AC
  Administered 2016-02-13 (×3): 0.1 mg via ORAL
  Filled 2016-02-13 (×4): qty 1

## 2016-02-13 MED ORDER — CLONIDINE HCL 0.1 MG PO TABS
0.1000 mg | ORAL_TABLET | Freq: Every day | ORAL | Status: DC
  Filled 2016-02-13 (×2): qty 1

## 2016-02-13 MED ORDER — ACETAMINOPHEN 325 MG PO TABS
650.0000 mg | ORAL_TABLET | Freq: Four times a day (QID) | ORAL | Status: DC | PRN
  Administered 2016-02-13 – 2016-02-14 (×3): 650 mg via ORAL
  Filled 2016-02-13 (×3): qty 2

## 2016-02-13 MED ORDER — DICYCLOMINE HCL 20 MG PO TABS
20.0000 mg | ORAL_TABLET | Freq: Four times a day (QID) | ORAL | Status: DC | PRN
Start: 2016-02-13 — End: 2016-02-15
  Administered 2016-02-13 – 2016-02-14 (×4): 20 mg via ORAL
  Filled 2016-02-13 (×4): qty 1

## 2016-02-13 MED ORDER — FLUOXETINE HCL 20 MG PO TABS
40.0000 mg | ORAL_TABLET | Freq: Every day | ORAL | Status: DC
  Filled 2016-02-13 (×2): qty 2

## 2016-02-13 MED ORDER — NICOTINE 21 MG/24HR TD PT24
21.0000 mg | MEDICATED_PATCH | Freq: Once | TRANSDERMAL | Status: DC
  Administered 2016-02-13: 21 mg via TRANSDERMAL
  Filled 2016-02-13 (×2): qty 1

## 2016-02-13 MED ORDER — ONDANSETRON 4 MG PO TBDP
4.0000 mg | ORAL_TABLET | Freq: Four times a day (QID) | ORAL | Status: DC | PRN
  Administered 2016-02-13 – 2016-02-14 (×2): 4 mg via ORAL
  Filled 2016-02-13 (×2): qty 1

## 2016-02-13 MED ORDER — METHOCARBAMOL 500 MG PO TABS
500.0000 mg | ORAL_TABLET | Freq: Three times a day (TID) | ORAL | Status: DC | PRN
  Administered 2016-02-13 – 2016-02-15 (×5): 500 mg via ORAL
  Filled 2016-02-13 (×5): qty 1

## 2016-02-13 MED ORDER — NICOTINE POLACRILEX 2 MG MT GUM
2.0000 mg | CHEWING_GUM | OROMUCOSAL | Status: DC | PRN
  Administered 2016-02-13 – 2016-02-15 (×7): 2 mg via ORAL
  Filled 2016-02-13 (×3): qty 1

## 2016-02-13 MED ORDER — HYDROXYZINE HCL 25 MG PO TABS
25.0000 mg | ORAL_TABLET | Freq: Four times a day (QID) | ORAL | Status: DC | PRN
  Administered 2016-02-13 – 2016-02-15 (×6): 25 mg via ORAL
  Filled 2016-02-13 (×4): qty 1
  Filled 2016-02-13: qty 10
  Filled 2016-02-13 (×2): qty 1

## 2016-02-13 MED ORDER — LOPERAMIDE HCL 2 MG PO CAPS
2.0000 mg | ORAL_CAPSULE | ORAL | Status: DC | PRN
  Administered 2016-02-13 – 2016-02-15 (×6): 2 mg via ORAL
  Filled 2016-02-13 (×6): qty 1

## 2016-02-13 MED ORDER — MAGNESIUM HYDROXIDE 400 MG/5ML PO SUSP: 30.0000 mL | Freq: Every day | ORAL | Status: DC | PRN

## 2016-02-13 MED ORDER — ALUM & MAG HYDROXIDE-SIMETH 200-200-20 MG/5ML PO SUSP: 30.0000 mL | ORAL | Status: DC | PRN

## 2016-02-13 MED ORDER — FLUOXETINE HCL 20 MG PO CAPS
40.0000 mg | ORAL_CAPSULE | Freq: Every day | ORAL | Status: DC
  Administered 2016-02-13 – 2016-02-15 (×3): 40 mg via ORAL
  Filled 2016-02-13: qty 2
  Filled 2016-02-13: qty 14
  Filled 2016-02-13 (×3): qty 2
  Filled 2016-02-13: qty 14
  Filled 2016-02-13: qty 2

## 2016-02-13 MED ORDER — TRAZODONE HCL 150 MG PO TABS
150.0000 mg | ORAL_TABLET | Freq: Every day | ORAL | Status: DC
  Administered 2016-02-13 – 2016-02-14 (×3): 150 mg via ORAL
  Filled 2016-02-13 (×2): qty 1
  Filled 2016-02-13: qty 7
  Filled 2016-02-13 (×2): qty 1
  Filled 2016-02-13: qty 7
  Filled 2016-02-13 (×2): qty 1

## 2016-02-13 MED ORDER — TRAZODONE HCL 150 MG PO TABS: 150.0000 mg | ORAL_TABLET | Freq: Every day | ORAL | Status: DC

## 2016-02-13 MED ORDER — CLONIDINE HCL 0.1 MG PO TABS
0.1000 mg | ORAL_TABLET | ORAL | Status: DC
  Administered 2016-02-14 – 2016-02-15 (×2): 0.1 mg via ORAL
  Filled 2016-02-13 (×4): qty 1

## 2016-02-13 MED ORDER — ACETAMINOPHEN 80 MG PO CHEW
80.0000 mg | CHEWABLE_TABLET | Freq: Four times a day (QID) | ORAL | Status: DC | PRN
  Filled 2016-02-13: qty 1

## 2016-02-13 NOTE — [facility] Suicide Risk Assessment (Signed)
[facility] Admission Suicide Risk Assessment   Nursing information obtained from:  Patient Demographic factors:  Male, Caucasian, Unemployed Current Mental Status:  Suicidal ideation indicated by patient, Self-harm thoughts, Self-harm behaviors Loss Factors:  NA Historical Factors:  Family history of suicide, Family history of mental illness or substance abuse Risk Reduction Factors:  Living with another person, especially a relative, Positive social support  Total Time spent with patient: 45 minutes Principal Problem: Opiate withdrawal ([place]) Diagnosis:   Patient Active Problem List   Diagnosis Date Noted  . MDD (major depressive disorder), recurrent severe, without psychosis ([place]) [F33.2] 02/13/2016  . Major depressive disorder, recurrent severe without psychotic features ([place]) [F33.2] 10/02/2015  . Anxiety [F41.9]   . Opiate withdrawal ([place]) [F11.23] 03/21/2015  . Substance induced mood disorder ([place]) [F19.94] 03/20/2015  . GAD (generalized anxiety disorder) [F41.1]   . Opioid use with withdrawal ([place]) [F11.93] 01/07/2015  . Moderate benzodiazepine use disorder ([place]) [F13.20] 01/06/2015  . Bullous emphysema ([place]) [J43.9] 11/30/2012  . COPD (chronic obstructive pulmonary disease) with emphysema ([place]) [J43.9] 11/02/2012  . Atypical chest pain [R07.89] 11/02/2012  . ANXIETY [F41.1] 08/01/2009  . DEPRESSION [F32.9] 08/01/2009  . ALLERGIC RHINITIS [J30.9] 08/01/2009  . PAIN IN JOINT, MULTIPLE SITES [M25.50] 08/01/2009  . BACK PAIN, LUMBAR, CHRONIC [M54.5] 08/01/2009  . HEADACHE [R51] 08/01/2009  . DIARRHEA, CHRONIC [R19.7] 08/01/2009   Subjective Data:  57year oldmale with opioid use disorder, depression, COPD who presents to ED voluntarily for opioid withdrawal and SI.   Patient states that he came to ED as he experienced withdrawal symptoms after discontinuing opioid that day, and developed SI of "fed up with everything" without plans. He cannot think of any triggers of his depression  but feels "despair." He reports that he started to use oxycodone since 2012 when he started to have back pain. He uses 200 mg every day, buying of the street. Although he initially overused for his back pain, he continues to use it to prevent withdrawal symptoms. He wants to stay away from medication and wants to be discharged before christmas. His longest sobriety was for 30 years, when he used to go to [employer] and stayed himself busy.   He endorses anhedonia and insomnia. He feels anxious at times but denies panic attack. He denies AH/VH. He complains  diarrhea, tremors, sweats and yawning. He has been on fluoxetine for 6 months with some effect.   Continued Clinical Symptoms:  Alcohol Use Disorder Identification Test Final Score (AUDIT): 0 The "Alcohol Use Disorders Identification Test", Guidelines for Use in Primary Care, Second Edition.  World Pharmacologist [facility]). Score between 0-7:  no or low risk or alcohol related problems. Score between 8-15:  moderate risk of alcohol related problems. Score between 16-19:  high risk of alcohol related problems. Score 20 or above:  warrants further diagnostic evaluation for alcohol dependence and treatment.   CLINICAL FACTORS:   Depression:   Anhedonia Insomnia Alcohol/Substance Abuse/Dependencies   Musculoskeletal: Strength & Muscle Tone: within normal limits Gait & Station: normal Patient leans: N/A  Psychiatric Specialty Exam: Physical Exam  Nursing note and vitals reviewed. Constitutional: He is oriented to person, place, and time. He appears well-developed and well-nourished.  Neurological: He is alert and oriented to person, place, and time.  No tremors    Review of Systems  Cardiovascular: Positive for chest pain and palpitations.  Neurological: Positive for tremors.  Psychiatric/Behavioral: Positive for depression and substance abuse. Negative for hallucinations and suicidal ideas. The patient is  nervous/anxious and has  insomnia.   All other systems reviewed and are negative.   Blood pressure 91/71, pulse (!) 144, temperature 97.9 F (36.6 C), temperature source Oral, resp. rate 16, height 6' (1.829 m), weight 137 lb (62.1 kg).Body mass index is 18.58 kg/m.  General Appearance: Disheveled  Eye Contact:  Fair  Speech:  Clear and Coherent  Volume:  Normal  Mood:  Depressed  Affect:  Blunt  Thought Process:  Coherent and Goal Directed  Orientation:  Full (Time, Place, and Person)  Thought Content:  Logical  Suicidal Thoughts:  No Perceptions: denies AH/VH  Homicidal Thoughts:  No  Memory:  Immediate;   Good Recent;   Good Remote;   Good  Judgement:  Good  Insight:  Fair  Psychomotor Activity:  Normal  Concentration:  Concentration: Good and Attention Span: Good  Recall:  Good  Fund of Knowledge:  Good  Language:  Good  Akathisia:  No  Handed:  Right  AIMS (if indicated):     Assets:  Communication Skills Desire for Improvement  ADL's:  Intact  Cognition:  WNL  Sleep:  Number of Hours: 5      COGNITIVE FEATURES THAT CONTRIBUTE TO RISK:  Closed-mindedness    SUICIDE RISK:   Moderate:  Frequent suicidal ideation with limited intensity, and duration, some specificity in terms of plans, no associated intent, good self-control, limited dysphoria/symptomatology, some risk factors present, and identifiable protective factors, including available and accessible social support.   PLAN OF CARE:  Patient will be admitted to inpatient psychiatric unit for stabilization and safety. Will provide and encourage milieu participation. Provide medication management and maked adjustments as needed.  Will follow daily.   I certify that inpatient services furnished can reasonably be expected to improve the patient's condition.  Norman Clay, MD 02/13/2016, 10:17 AM

## 2016-02-13 NOTE — H&P (Signed)
Psychiatric Admission Assessment Adult  Patient Identification: Brett Beck MRN:  [id number] Date of Evaluation:  02/13/2016 Chief Complaint:  MDD RECURRENT SEVERE OOPIATE USE DISORDER SEVERE Principal Diagnosis: Opiate withdrawal ([place]) Diagnosis:   Patient Active Problem List   Diagnosis Date Noted  . MDD (major depressive disorder), recurrent severe, without psychosis ([place]) [F33.2] 02/13/2016  . Major depressive disorder, recurrent severe without psychotic features ([place]) [F33.2] 10/02/2015  . Anxiety [F41.9]   . Opiate withdrawal ([place]) [F11.23] 03/21/2015  . Substance induced mood disorder ([place]) [F19.94] 03/20/2015  . GAD (generalized anxiety disorder) [F41.1]   . Opioid use with withdrawal ([place]) [F11.93] 01/07/2015  . Moderate benzodiazepine use disorder ([place]) [F13.20] 01/06/2015  . Bullous emphysema ([place]) [J43.9] 11/30/2012  . COPD (chronic obstructive pulmonary disease) with emphysema ([place]) [J43.9] 11/02/2012  . Atypical chest pain [R07.89] 11/02/2012  . ANXIETY [F41.1] 08/01/2009  . DEPRESSION [F32.9] 08/01/2009  . ALLERGIC RHINITIS [J30.9] 08/01/2009  . PAIN IN JOINT, MULTIPLE SITES [M25.50] 08/01/2009  . BACK PAIN, LUMBAR, CHRONIC [M54.5] 08/01/2009  . HEADACHE [R51] 08/01/2009  . DIARRHEA, CHRONIC [R19.7] 08/01/2009   History of Present Illness:  40 year old male with opioid use disorder, depression, COPD who presents to ED voluntarily for opioid withdrawal and SI.   Patient states that he came to ED as he experienced withdrawal symptoms after discontinuing opioid that day, and developed SI of "fed up with everything" without plans. He cannot think of any triggers of his depression but feels "despair." He reports that he started to use oxycodone since 2012 when he started to have back pain. He uses 200 mg every day, buying of the street. Although he initially overused for his back pain, he continues to use it to prevent withdrawal symptoms. He wants to stay away from  medication and wants to be discharged before christmas. His longest sobriety was for 30 years, when he used to go to [employer] and stayed himself busy.   He endorses anhedonia and insomnia. He feels anxious at times but denies panic attack. He denies AH/VH. He complains  diarrhea, tremors, sweats and yawning. He has been on fluoxetine for 6 months with some effect.   Associated Signs/Symptoms: Depression Symptoms:  depressed mood, insomnia, fatigue, anxiety, (Hypo) Manic Symptoms:  denies Anxiety Symptoms:  Excessive Worry, Psychotic Symptoms:  denies PTSD Symptoms: Had a traumatic exposure:  denies Total Time spent with patient: 45 minutes  Past Psychiatric History:  Outpatient: seeing a psychiatrist at [facility], last 6 weeks ago Psychiatry admission: at [facility] in January 2017, Observation unit in 09/2015, [facility] in 03/2014 Previous suicide attempt: denies Past trials of medication: fluoxetine, clonazepam, Trazodone History of violence: denies  Is the patient at risk to self? No.  Has the patient been a risk to self in the past 6 months? Yes.    Has the patient been a risk to self within the distant past? No.  Is the patient a risk to others? No.  Has the patient been a risk to others in the past 6 months? No.  Has the patient been a risk to others within the distant past? No.   Prior Inpatient Therapy:   Prior Outpatient Therapy:    Alcohol Screening: 1. How often do you have a drink containing alcohol?: Never 9. Have you or someone else been injured as a result of your drinking?: No 10. Has a relative or friend or a doctor or another health worker been concerned about your drinking or suggested you cut down?: No  Alcohol Use Disorder Identification Test Final Score (AUDIT): 0 Brief Intervention: AUDIT score less than 7 or less-screening does not suggest unhealthy drinking-brief intervention not indicated Substance Abuse History in the last 12 months:  Yes.   Consequences of  Substance Abuse: Withdrawal Symptoms:   Diarrhea Nausea Tremors Previous Psychotropic Medications: Yes  Psychological Evaluations: Yes  Past Medical History:  Past Medical History:  Diagnosis Date  . Chronic back pain   . Degenerative disc disease   . Emphysema/COPD ([place])   . Opiate abuse, continuous   . Pneumothorax on left     Past Surgical History:  Procedure Laterality Date  . lung inflation     left lung  . LUNG SURGERY     Dr Arlyce Dice-- left lung  . NOSE SURGERY     broken nose   Family History:  Family History  Problem Relation Age of Onset  . Alpha-1 antitrypsin deficiency Mother   . Emphysema Mother   . Asthma Mother    Family Psychiatric  History: denies Tobacco Screening: Have you used any form of tobacco in the last 30 days? (Cigarettes, Smokeless Tobacco, Cigars, and/or Pipes): Yes Tobacco use, Select all that apply: 5 or more cigarettes per day Are you interested in Tobacco Cessation Medications?: No, patient refused Counseled patient on smoking cessation including recognizing danger situations, developing coping skills and basic information about quitting provided: Refused/Declined practical counseling Social History:  History  Alcohol Use No     History  Drug Use  . Types: Oxycodone    Comment: opiates    Additional Social History:      Lives with his sister since May 2017, who is sick and uses morphine for her treatment. 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 Effexor [Venlafaxine] Diarrhea  . Hydrocodone-Acetaminophen     REACTION: severe headaches  . Ibuprofen Other (See Comments)    Severe stomach issues  . Lyrica [Pregabalin] Diarrhea    Swelling in the feet  . Neurontin [Gabapentin] Other (See Comments)    Severe stomach issues  . Nsaids Other (See Comments)    Severe stomach issues  .  Tramadol Hcl     REACTION: diarrhea, abdominal pain   Lab Results:  Results for orders placed or performed during the hospital encounter of 02/11/16 (from the past 48 hour(s))  Comprehensive metabolic panel     Status: Abnormal   Collection Time: 02/11/16  3:20 PM  Result Value Ref Range   Sodium 138 135 - 145 mmol/L   Potassium 3.8 3.5 - 5.1 mmol/L   Chloride 102 101 - 111 mmol/L   CO2 24 22 - 32 mmol/L   Glucose, Bld 118 (H) 65 - 99 mg/dL   BUN 12 6 - 20 mg/dL   Creatinine, Ser 0.94 0.61 - 1.24 mg/dL   Calcium 9.3 8.9 - 10.3 mg/dL   Total Protein 7.3 6.5 - 8.1 g/dL   Albumin 4.4 3.5 - 5.0 g/dL   AST 19 15 - 41 U/L   ALT 13 (L) 17 - 63 U/L   Alkaline Phosphatase 94 38 - 126 U/L   Total Bilirubin 1.0 0.3 - 1.2 mg/dL   GFR calc non Af Amer >60 >60 mL/min   GFR calc Af  Amer >60 >60 mL/min    Comment: (NOTE) The eGFR has been calculated using the CKD EPI equation. This calculation has not been validated in all clinical situations. eGFR's persistently <60 mL/min signify possible Chronic Kidney Disease.    Anion gap 12 5 - 15  Ethanol     Status: None   Collection Time: 02/11/16  3:20 PM  Result Value Ref Range   Alcohol, Ethyl (B) <5 <5 mg/dL    Comment:        LOWEST DETECTABLE LIMIT FOR SERUM ALCOHOL IS 5 mg/dL FOR MEDICAL PURPOSES ONLY   cbc     Status: Abnormal   Collection Time: 02/11/16  3:20 PM  Result Value Ref Range   WBC 12.8 (H) 4.0 - 10.5 K/uL   RBC 4.96 4.22 - 5.81 MIL/uL   Hemoglobin 16.7 13.0 - 17.0 g/dL   HCT 46.3 39.0 - 52.0 %   MCV 93.3 78.0 - 100.0 fL   MCH 33.7 26.0 - 34.0 pg   MCHC 36.1 (H) 30.0 - 36.0 g/dL   RDW 13.8 11.5 - 15.5 %   Platelets 508 (H) 150 - 400 K/uL    Blood Alcohol level:  Lab Results  Component Value Date   ETH <5 02/11/2016   ETH <5 89/16/9450    Metabolic Disorder Labs:  No results found for: HGBA1C, MPG No results found for: PROLACTIN No results found for: CHOL, TRIG, HDL, CHOLHDL, VLDL, LDLCALC  Current  Medications: Current Facility-Administered Medications  Medication Dose Route Frequency Provider Last Rate Last Dose  . alum & mag hydroxide-simeth (MAALOX/MYLANTA) 200-200-20 MG/5ML suspension 30 mL  30 mL Oral Q4H PRN [name], NP      . cloNIDine (CATAPRES) tablet 0.1 mg  0.1 mg Oral QID [name], NP   0.1 mg at 02/13/16 0858   Followed by  . [START ON 02/14/2016] cloNIDine (CATAPRES) tablet 0.1 mg  0.1 mg Oral BH-qamhs [name], NP       Followed by  . [START ON 02/16/2016] cloNIDine (CATAPRES) tablet 0.1 mg  0.1 mg Oral QAC breakfast [name], NP      . dicyclomine (BENTYL) tablet 20 mg  20 mg Oral Q6H PRN [name], NP   20 mg at 02/13/16 0901  . FLUoxetine (PROZAC) capsule 40 mg  40 mg Oral Daily [name], MD   40 mg at 02/13/16 0858  . hydrOXYzine (ATARAX/VISTARIL) tablet 25 mg  25 mg Oral Q6H PRN [name], NP   25 mg at 02/13/16 0901  . loperamide (IMODIUM) capsule 2-4 mg  2-4 mg Oral PRN [name], NP   2 mg at 02/13/16 0901  . magnesium hydroxide (MILK OF MAGNESIA) suspension 30 mL  30 mL Oral Daily PRN [name], NP      . methocarbamol (ROBAXIN) tablet 500 mg  500 mg Oral Q8H PRN [name], NP   500 mg at 02/13/16 0901  . nicotine (NICODERM CQ - dosed in mg/24 hours) patch 21 mg  21 mg Transdermal Once [name], NP      . ondansetron (ZOFRAN-ODT) disintegrating tablet 4 mg  4 mg Oral Q6H PRN [name], NP      . traZODone (DESYREL) tablet 150 mg  150 mg Oral QHS Laverle Hobby, PA-C   150 mg at 02/13/16 0041   PTA Medications: Prescriptions Prior to Admission  Medication Sig Dispense Refill Last Dose  . FLUoxetine (  PROZAC) 10 MG capsule Take 1 capsule (10 mg total) by mouth daily. (Patient not taking: Reported on 02/11/2016) 30 capsule 0 Not Taking at Unknown time  . FLUoxetine (PROZAC) 20 MG tablet Take 2 tablets (40 mg total) by mouth daily. 60 tablet 0 02/07/2016  . hydrOXYzine (ATARAX/VISTARIL) 50 MG  tablet Take 1 tablet (50 mg total) by mouth every 6 (six) hours as needed for anxiety. (Patient not taking: Reported on 02/11/2016) 30 tablet 0 Not Taking at Unknown time  . loperamide (IMODIUM) 2 MG capsule Take 1 capsule (2 mg total) by mouth 4 (four) times daily as needed for diarrhea or loose stools. (Patient taking differently: Take 4 mg by mouth 4 (four) times daily as needed for diarrhea or loose stools. ) 12 capsule 0 02/11/2016 at Unknown time  . mometasone-formoterol (DULERA) 100-5 MCG/ACT AERO Inhale 2 puffs into the lungs 2 (two) times daily. (Patient not taking: Reported on 02/11/2016)   Not Taking at Unknown time  . ondansetron (ZOFRAN ODT) 4 MG disintegrating tablet Take 1 tablet (4 mg total) by mouth every 8 (eight) hours as needed for nausea or vomiting. 20 tablet 0 02/11/2016 at Unknown time  . traZODone (DESYREL) 100 MG tablet Take 1 tablet (100 mg total) by mouth at bedtime and may repeat dose one time if needed. (Patient not taking: Reported on 02/11/2016) 30 tablet 0 Not Taking at Unknown time  . traZODone (DESYREL) 150 MG tablet Take 1 tablet (150 mg total) by mouth at bedtime. 30 tablet 0 02/09/2016    Musculoskeletal: Strength & Muscle Tone: within normal limits Gait & Station: normal Patient leans: N/A  Psychiatric Specialty Exam: Physical Exam  Nursing note and vitals reviewed. Constitutional: He is oriented to person, place, and time. He appears well-developed and well-nourished.  Neurological: He is alert and oriented to person, place, and time.  No tremors    Review of Systems  Gastrointestinal: Positive for diarrhea and nausea.  Musculoskeletal: Positive for back pain and myalgias.  Psychiatric/Behavioral: Positive for depression and substance abuse. Negative for hallucinations and suicidal ideas. The patient is nervous/anxious and has insomnia.   All other systems reviewed and are negative.   Blood pressure 91/71, pulse (!) 144, temperature 97.9 F (36.6 C),  temperature source Oral, resp. rate 16, height 6' (1.829 m), weight 137 lb (62.1 kg).Body mass index is 18.58 kg/m.  General Appearance: Disheveled  Eye Contact:  Fair  Speech:  Clear and Coherent  Volume:  Normal  Mood:  "despair"  Affect:  Blunt  Thought Process:  Coherent and Goal Directed  Orientation:  Full (Time, Place, and Person)  Thought Content:  Logical Perceptions: denies AH/VH  Suicidal Thoughts:  No  Homicidal Thoughts:  No  Memory:  Immediate;   Good Recent;   Good Remote;   Good  Judgement:  Fair  Insight:  Fair  Psychomotor Activity:  Normal  Concentration:  Concentration: Good and Attention Span: Good  Recall:  Good  Fund of Knowledge:  Good  Language:  Good  Akathisia:  No  Handed:  Right  AIMS (if indicated):     Assets:  Communication Skills Desire for Improvement  ADL's:  Intact  Cognition:  WNL  Sleep:  Number of Hours: 5   Assessment 40 year old male with opioid use disorder, depression, COPD who presents to ED voluntarily for opioid withdrawal and passive SI. Psychosocial stressors including chronic pain.   # Opioid withdrawal  # Opioid use disorder Patient endorses symptoms consistent with  opioid withdrawal. Will continue clonidine protocol. Patient is motivated for sobriety and is interested in outpatient treatment (but not residential treatment). Will continue motivational interview and  liaise with SW.   # MDD # r/o Opioid induced mood disorder Patient endorses neurovegetative symptoms. Although it is difficult to discern in the setting of opioid use, will continue fluoxetine to target his mood at this time.   Plan - continue clonidine protocol/supportive therapy - continue fluoxetine 40 mg daily - continue Trazodone 150 mg qhs - .- Admit for crisis management and stabilization. - Medication management to reduce current symptoms to base line and improve the patient's overall level of functioning. - Monitor for the adverse effect of the  medications and anger outbursts - Continue 15 minutes observation for safety concerns - Encouraged to participate in milieu therapy and group therapy counseling sessions and also work with coping skills -  Develop treatment plan to decrease risk of relapse upon discharge and to reduce the need for readmission. -  Psycho-social education regarding relapse prevention and self care. - Health care follow up as needed for medical problems. - Restart home medications where appropriate.  Treatment Plan Summary: Daily contact with patient to assess and evaluate symptoms and progress in treatment and Medication management  Observation Level/Precautions:  Detox 15 minute checks  Laboratory:  as needed  Psychotherapy:  Group thearpy  Medications:  As above  Consultations:  SW  Discharge Concerns:  n/a  Estimated LOS:3-5 days  Other:     Physician Treatment Plan for Primary Diagnosis: Opiate withdrawal ([place]) Long Term Goal(s): Improvement in symptoms so as ready for discharge  Short Term Goals: Ability to demonstrate self-control will improve and Ability to identify and develop effective coping behaviors will improve  Physician Treatment Plan for Secondary Diagnosis: Principal Problem:   Opiate withdrawal ([place]) Active Problems:   MDD (major depressive disorder), recurrent severe, without psychosis ([place])  Long Term Goal(s): Improvement in symptoms so as ready for discharge  Short Term Goals: Ability to identify and develop effective coping behaviors will improve and Ability to maintain clinical measurements within normal limits will improve  I certify that inpatient services furnished can reasonably be expected to improve the patient's condition.    Norman Clay, MD 12/20/20179:24 AM

## 2016-02-13 NOTE — Tx Team (Signed)
Initial Treatment Plan 02/13/2016 12:57 AM Brett Beck [id number]    PATIENT STRESSORS: Medication change or noncompliance Substance abuse   PATIENT STRENGTHS: Ability for insight Average or above average intelligence Communication skills Motivation for treatment/growth Physical Health Supportive family/friends   PATIENT IDENTIFIED PROBLEMS: Substance Abuse  Suicide Risk  "Detox, wanting to get clean"  "Depression"  "Anxiety"             DISCHARGE CRITERIA:  Ability to meet basic life and health needs Improved stabilization in mood, thinking, and/or behavior Motivation to continue treatment in a less acute level of care Need for constant or close observation no longer present Verbal commitment to aftercare and medication compliance Withdrawal symptoms are absent or subacute and managed without 24-hour nursing intervention  PRELIMINARY DISCHARGE PLAN: Attend 12-step recovery group Outpatient therapy Return to previous living arrangement  PATIENT/FAMILY INVOLVEMENT: This treatment plan has been presented to and reviewed with the patient, Brett Beck.  The patient and family have been given the opportunity to ask questions and make suggestions.  Dustin Flock, RN 02/13/2016, 12:57 AM

## 2016-02-13 NOTE — [facility] Group Notes (Signed)
[facility] LCSW Group Therapy  02/13/2016 4:10 PM  Type of Therapy:  Group Therapy  Participation Level:  Did Not Attend-pt invited. Chose to rest in room.   Summary of Progress/Problems: Emotion Regulation: This group focused on both positive and negative emotion identification and allowed group members to process ways to identify feelings, regulate negative emotions, and find healthy ways to manage internal/external emotions. Group members were asked to reflect on a time when their reaction to an emotion led to a negative outcome and explored how alternative responses using emotion regulation would have benefited them. Group members were also asked to discuss a time when emotion regulation was utilized when a negative emotion was experienced.   Brett Beck N Smart LCSW 02/13/2016, 4:10 PM

## 2016-02-13 NOTE — BHH Suicide Risk Assessment (Signed)
[place] INPATIENT:  Family/Significant Other Suicide Prevention Education  Suicide Prevention Education:  Patient Refusal for Family/Significant Other Suicide Prevention Education: The patient Brett Beck has refused to provide written consent for family/significant other to be provided Family/Significant Other Suicide Prevention Education during admission and/or prior to discharge.  Physician notified.  SPE completed with pt, as pt refused to consent to family contact. SPI pamphlet provided to pt and pt was encouraged to share information with support network, ask questions, and talk about any concerns relating to SPE. Pt denies access to guns/firearms and verbalized understanding of information provided. Mobile Crisis information also provided to pt.   Shalondra Wunschel N Smart LCSW 02/13/2016, 11:23 AM

## 2016-02-13 NOTE — Progress Notes (Signed)
Patient denies SI, HI and AVH.  Patient has been flat, depressed and engaged minimally during the shift.  Patient is compliant with medications and had had no incidents of behavioral dyscontrol.   Assess patient for safety, offer medications as prescribed, engage patient in 1:1 staff talks  Continue to monitor as prescribed.

## 2016-02-13 NOTE — Tx Team (Signed)
Interdisciplinary Treatment and Diagnostic Plan Update  02/13/2016 Time of Session: 9:30AM Brett Beck MRN: [id number]  Principal Diagnosis: Opiate withdrawal [facility])  Secondary Diagnoses: Principal Problem:   Opiate withdrawal ([place]) Active Problems:   MDD (major depressive disorder), recurrent severe, without psychosis ([place])   Current Medications:  Current Facility-Administered Medications  Medication Dose Route Frequency Provider Last Rate Last Dose  . alum & mag hydroxide-simeth (MAALOX/MYLANTA) 200-200-20 MG/5ML suspension 30 mL  30 mL Oral Q4H PRN [name], NP      . cloNIDine (CATAPRES) tablet 0.1 mg  0.1 mg Oral QID [name], NP   0.1 mg at 02/13/16 0858   Followed by  . [START ON 02/14/2016] cloNIDine (CATAPRES) tablet 0.1 mg  0.1 mg Oral BH-qamhs [name], NP       Followed by  . [START ON 02/16/2016] cloNIDine (CATAPRES) tablet 0.1 mg  0.1 mg Oral QAC breakfast [name], NP      . dicyclomine (BENTYL) tablet 20 mg  20 mg Oral Q6H PRN [name], NP   20 mg at 02/13/16 0901  . FLUoxetine (PROZAC) capsule 40 mg  40 mg Oral Daily [name], MD   40 mg at 02/13/16 0858  . hydrOXYzine (ATARAX/VISTARIL) tablet 25 mg  25 mg Oral Q6H PRN [name], NP   25 mg at 02/13/16 0901  . loperamide (IMODIUM) capsule 2-4 mg  2-4 mg Oral PRN [name], NP   2 mg at 02/13/16 0901  . magnesium hydroxide (MILK OF MAGNESIA) suspension 30 mL  30 mL Oral Daily PRN [name], NP      . methocarbamol (ROBAXIN) tablet 500 mg  500 mg Oral Q8H PRN [name], NP   500 mg at 02/13/16 0901  . nicotine (NICODERM CQ - dosed in mg/24 hours) patch 21 mg  21 mg Transdermal Once [name], NP      . ondansetron (ZOFRAN-ODT) disintegrating tablet 4 mg  4 mg Oral Q6H PRN [name], NP      . traZODone (DESYREL) tablet 150 mg  150 mg Oral QHS Laverle Hobby, PA-C   150 mg at 02/13/16 0041   PTA Medications: Prescriptions Prior to Admission   Medication Sig Dispense Refill Last Dose  . FLUoxetine (PROZAC) 10 MG capsule Take 1 capsule (10 mg total) by mouth daily. (Patient not taking: Reported on 02/11/2016) 30 capsule 0 Not Taking at Unknown time  . FLUoxetine (PROZAC) 20 MG tablet Take 2 tablets (40 mg total) by mouth daily. 60 tablet 0 02/07/2016  . hydrOXYzine (ATARAX/VISTARIL) 50 MG tablet Take 1 tablet (50 mg total) by mouth every 6 (six) hours as needed for anxiety. (Patient not taking: Reported on 02/11/2016) 30 tablet 0 Not Taking at Unknown time  . loperamide (IMODIUM) 2 MG capsule Take 1 capsule (2 mg total) by mouth 4 (four) times daily as needed for diarrhea or loose stools. (Patient taking differently: Take 4 mg by mouth 4 (four) times daily as needed for diarrhea or loose stools. ) 12 capsule 0 02/11/2016 at Unknown time  . mometasone-formoterol (DULERA) 100-5 MCG/ACT AERO Inhale 2 puffs into the lungs 2 (two) times daily. (Patient not taking: Reported on 02/11/2016)   Not Taking at Unknown time  . ondansetron (ZOFRAN ODT) 4 MG disintegrating tablet Take 1 tablet (4 mg total) by mouth every 8 (eight) hours as needed for nausea or vomiting. 20 tablet 0 02/11/2016 at Unknown  time  . traZODone (DESYREL) 100 MG tablet Take 1 tablet (100 mg total) by mouth at bedtime and may repeat dose one time if needed. (Patient not taking: Reported on 02/11/2016) 30 tablet 0 Not Taking at Unknown time  . traZODone (DESYREL) 150 MG tablet Take 1 tablet (150 mg total) by mouth at bedtime. 30 tablet 0 02/09/2016    Patient Stressors: Medication change or noncompliance Substance abuse  Patient Strengths: Ability for insight Average or above average intelligence Communication skills Motivation for treatment/growth Physical Health Supportive family/friends  Treatment Modalities: Medication Management, Group therapy, Case management,  1 to 1 session with clinician, Psychoeducation, Recreational therapy.   Physician Treatment Plan for  Primary Diagnosis: Opiate withdrawal ([place]) Long Term Goal(s): Improvement in symptoms so as ready for discharge Improvement in symptoms so as ready for discharge   Short Term Goals: Ability to demonstrate self-control will improve Ability to identify and develop effective coping behaviors will improve Ability to identify and develop effective coping behaviors will improve Ability to maintain clinical measurements within normal limits will improve  Medication Management: Evaluate patient's response, side effects, and tolerance of medication regimen.  Therapeutic Interventions: 1 to 1 sessions, Unit Group sessions and Medication administration.  Evaluation of Outcomes: Progressing  Physician Treatment Plan for Secondary Diagnosis: Principal Problem:   Opiate withdrawal ([place]) Active Problems:   MDD (major depressive disorder), recurrent severe, without psychosis ([place])  Long Term Goal(s): Improvement in symptoms so as ready for discharge Improvement in symptoms so as ready for discharge   Short Term Goals: Ability to demonstrate self-control will improve Ability to identify and develop effective coping behaviors will improve Ability to identify and develop effective coping behaviors will improve Ability to maintain clinical measurements within normal limits will improve     Medication Management: Evaluate patient's response, side effects, and tolerance of medication regimen.  Therapeutic Interventions: 1 to 1 sessions, Unit Group sessions and Medication administration.  Evaluation of Outcomes: Progressing   RN Treatment Plan for Primary Diagnosis: Opiate withdrawal ([place]) Long Term Goal(s): Knowledge of disease and therapeutic regimen to maintain health will improve  Short Term Goals: Ability to remain free from injury will improve, Ability to disclose and discuss suicidal ideas and Ability to identify and develop effective coping behaviors will improve  Medication Management: RN  will administer medications as ordered by provider, will assess and evaluate patient's response and provide education to patient for prescribed medication. RN will report any adverse and/or side effects to prescribing provider.  Therapeutic Interventions: 1 on 1 counseling sessions, Psychoeducation, Medication administration, Evaluate responses to treatment, Monitor vital signs and CBGs as ordered, Perform/monitor CIWA, COWS, AIMS and Fall Risk screenings as ordered, Perform wound care treatments as ordered.  Evaluation of Outcomes: Progressing   LCSW Treatment Plan for Primary Diagnosis: Opiate withdrawal ([place]) Long Term Goal(s): Safe transition to appropriate next level of care at discharge, Engage patient in therapeutic group addressing interpersonal concerns.  Short Term Goals: Engage patient in aftercare planning with referrals and resources, Facilitate patient progression through stages of change regarding substance use diagnoses and concerns and Identify triggers associated with mental health/substance abuse issues  Therapeutic Interventions: Assess for all discharge needs, 1 to 1 time with Social worker, Explore available resources and support systems, Assess for adequacy in community support network, Educate family and significant other(s) on suicide prevention, Complete Psychosocial Assessment, Interpersonal group therapy.  Evaluation of Outcomes: Progressing   Progress in Treatment: Attending groups: No.  Participating in groups: No. Taking medication  as prescribed: Yes. Toleration medication: Yes. Family/Significant other contact made: No, will contact:  family member if patient consents Patient understands diagnosis: Yes. Discussing patient identified problems/goals with staff: Yes. Medical problems stabilized or resolved: Yes. Denies suicidal/homicidal ideation:No. Passive SI/Able to contract for safety on the unit.  Issues/concerns per patient self-inventory: No. Other:  n/a  New problem(s) identified: No, Describe:  n/a  New Short Term/Long Term Goal(s): crisis stabilization, medication management, development of comprehensive mental wellness/sobriety plan; detox.   Discharge Plan or Barriers: CSW assessing for appropriate referrals. Per Assessment not, pt has had a current psychiatrist for the past 3-4 months but has stopped taking prozac several days prior to his admission.   Reason for Continuation of Hospitalization: Anxiety Depression Medication stabilization Suicidal ideation Withdrawal symptoms  Estimated Length of Stay: 3-5 days   Attendees: Patient: 02/13/2016 11:05 AM  Physician: Dr. Parke Poisson MD 02/13/2016 11:05 AM  Nursing: Cristal Generous RN 02/13/2016 11:05 AM  RN Care Manager: Lars Pinks CM 02/13/2016 11:05 AM  Social Worker: Maxie Better, LCSW; Erasmo Downer Drinkard LCSW; Adriana Reams LCSW 02/13/2016 11:05 AM  Recreational Therapist:  02/13/2016 11:05 AM  Other: Agustina Caroli NP 02/13/2016 11:05 AM  Other:  02/13/2016 11:05 AM  Other: 02/13/2016 11:05 AM    Scribe for Treatment Team: [place], LCSW 02/13/2016 11:05 AM

## 2016-02-13 NOTE — Progress Notes (Signed)
Patient ID: Brett Beck, male   DOB: August 07, 1975, 40 y.o.   MRN: [id number]  Pt currently presents with an anxious affect and cooperative behavior. Pt reports to writer that their goal is to "get through this withdrawal." Pt reports poor sleep with current medication regimen. Pt complains of multiple signs and symptoms of withdrawal including nausea, diarrhea, anxiety, headache and generalized pain. Pt BP trending hypotensive, pt reports this is baseline for him.   Pt provided with scheduled and as needed medications per providers orders. Pt's labs and vitals were monitored throughout the night. Pt supported emotionally and encouraged to express concerns and questions. Pt educated on medications.  Pt's safety ensured with 15 minute and environmental checks. Pt currently denies SI/HI and A/V hallucinations. Pt verbally agrees to seek staff if SI/HI or A/VH occurs and to consult with staff before acting on any harmful thoughts. Will continue POC.

## 2016-02-13 NOTE — Progress Notes (Signed)
Patient attended N/A group meeting tonight. 

## 2016-02-13 NOTE — Progress Notes (Signed)
Recreation Therapy Notes  Date: 02/13/16 Time: 0930 Location: 300 Hall Group Room  Group Topic: Stress Management  Goal Area(s) Addresses:  Patient will verbalize importance of using healthy stress management.  Patient will identify positive emotions associated with healthy stress management.   Behavioral Response: Engaged  Intervention: Guided Imagery  Activity :  Rainforest Imagery.  LRT introduced the stress management technique of guided imagery.  LRT read a script that took patients on a journey through the rainforest to allow patients to get the feeling of being in the middle of a rainforest.  Patients were to follow along as LRT read the script to engage in technique.    Education:  Stress Management, Discharge Planning.   Education Outcome: Acknowledges edcuation/In group clarification offered/Needs additional education  Clinical Observations/Feedback: Pt attended group.   Victorino Sparrow, LRT/CTRS         Victorino Sparrow A 02/13/2016 11:23 AM

## 2016-02-13 NOTE — BHH Counselor (Signed)
Adult Comprehensive Assessment  Patient ID: Brett Beck, male   DOB: 1975/12/18, 40 y.o.   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   Living/Environment/Situation:  Living Arrangements: Other relatives Living conditions (as described by patient or guardian): pt lives with his sister and her children How long has patient lived in current situation?: few years  What is atmosphere in current home: Comfortable, Quarry manager, Supportive  Family History:  Marital status: Single Are you sexually active?: No What is your sexual orientation?: heterosexual Has your sexual activity been affected by drugs, alcohol, medication, or emotional stress?: n/a  Does patient have children?: No  Childhood History:  By whom was/is the patient raised?: Both parents Additional childhood history information: parents were married. pt reports great childhood. neither parent suffered from mental illness or substance abuse to his knowledge Description of patient's relationship with caregiver when they were a child: close to both parents Patient's description of current relationship with people  who raised him/her: close to mother until her death in 2015/07/05; close with father-works with him Teacher, adult education for the goverment/IT services).  How were you disciplined when you got in trouble as a child/adolescent?: talked to by a parent  Does patient have siblings?: Yes Number of Siblings: 1 Description of patient's current relationship with siblings: younger sister. "we are close. we have been living together for awhile."  Did patient suffer any verbal/emotional/physical/sexual abuse as a child?: No Did patient suffer from severe childhood neglect?: No Has patient ever been sexually abused/assaulted/raped as an adolescent or adult?: No Was the patient ever a victim of a crime or a disaster?: No Witnessed domestic violence?: No Has patient been effected by domestic violence as an adult?: No  Education:  Highest grade of school patient has completed: some college.  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  Has patient ever been in the [employer]?: No Has patient ever served in combat?: No Did You Receive Any Psychiatric Treatment/Services While in the [employer]?: No Are There Guns or Other Weapons in [place]?: No Are These Weapons Safely Secured?:  (n/a)  Financial Resources:   Financial resources: Support from parents / caregiver, No income Does patient have a Programmer, applications or guardian?: No  Alcohol/Substance Abuse:   What has been your use of drugs/alcohol within the last 12 months?: opiate abuse -ongoing for 6 years. Pt reports  no other substance  use or abuse. Pt states he started taking oxcycodone after back injury 6 years ago and started buying it off the street when Rx ran out. 1 month clean time in the past six years per pt.  If attempted suicide, did drugs/alcohol play a role in this?: No (pt denies any current or past SI or attempts) Alcohol/Substance Abuse Treatment Hx: Denies past history If yes, describe treatment: n/a  Has alcohol/substance abuse ever caused legal problems?: No  Social Support System:   Patient's Community Support System: Good Describe Community Support System: "I have some really good friends that care about me."  Type of faith/religion: none How does patient's faith help to cope with current illness?: n/a   Leisure/Recreation:   Leisure and Hobbies: guitar "but I've lost interest recently."   Strengths/Needs:   What things does the patient do well?: music; work; nice guy In what areas does patient struggle / problems for patient: coping with pain and stress; motivation is lacking per pt.   Discharge Plan:   Does patient have access to transportation?: Yes (car and license) Will patient be returning to same living situation after discharge?: Yes Currently receiving community mental health services: Yes (From Whom) (Dr. Dareen Piano in [place],  [place]) If no, would patient like referral for services when discharged?: Yes (What county?) [place]. pt agreeable to [facility] referral in [place] as well for outpatient SAIOP) Does patient have financial barriers related to discharge medications?: Yes Patient description of barriers related to discharge medications: limited income/no insurance. he pays out of pocket for his psychiatrist currently.   Summary/Recommendations:   Summary and Recommendations (to be completed by the evaluator): Patient is 40 year old male living in [place], [place] ([place]) with his sister. Patient presents voluntarily to the hospital seeking treatment for opiate detox,  depression/anxiety, and for medication stabilization. He denies SI/HI/AVH. Patient reports that this is his first admission here. He is currently unemployed and works as an Chief Executive Officer (IT services). He sees Dr. Dareen Piano  at [facility] in [place], [place] for medication management and is interested in outpatient substance abuse/counseling. CSW assessing for appropriate referrals. Recommendations for patient include: therapeutic milieu, encourage group attendance and participation, medication management for mood stabilization/withdrawals, and development of comprehensive mental wellness/sobriety plan.    Kimber Relic Smart LCSW 02/13/2016 11:04 AM

## 2016-02-13 NOTE — Progress Notes (Signed)
Admission Note:  40 year old male who presents voluntary, in no acute distress, for the treatment of SI, Substance Abuse, and Depression. Patient appears flat and depressed. Patient was pleasant, calm, and cooperative with admission process. Patient presents with passive SI, no plan, and verbally contracts for safety upon admission. Patient denies AVH.  Patient reports that he has been "overwhelmed with everything in life. Nothing specific".  Patient reports opiate abuse and states that he has been using 200 mg of oxycodone daily that he has gotten off of the streets.  Patient reports chronic back pain.  Patient currently lives with his sister and identifies his sister and father as his support systems.  While at [facility], patient would like to "detox, wanting to get clean", and work on "Depression", and "Anxiety".  Skin was assessed and found to be clear of any abnormal marks.  Patient searched and no contraband found, POC and unit policies explained and understanding verbalized. Consents obtained. Food and fluids offered and fluids accepted. Patient had no additional questions or concerns.

## 2016-02-14 DIAGNOSIS — F1994 Other psychoactive substance use, unspecified with psychoactive substance-induced mood disorder: Secondary | ICD-10-CM

## 2016-02-14 DIAGNOSIS — F411 Generalized anxiety disorder: Secondary | ICD-10-CM

## 2016-02-14 DIAGNOSIS — F332 Major depressive disorder, recurrent severe without psychotic features: Principal | ICD-10-CM

## 2016-02-14 DIAGNOSIS — F17210 Nicotine dependence, cigarettes, uncomplicated: Secondary | ICD-10-CM

## 2016-02-14 MED ORDER — BUSPIRONE HCL 15 MG PO TABS
7.5000 mg | ORAL_TABLET | Freq: Two times a day (BID) | ORAL | Status: DC
  Administered 2016-02-14 – 2016-02-15 (×2): 7.5 mg via ORAL
  Filled 2016-02-14: qty 7
  Filled 2016-02-14 (×3): qty 1
  Filled 2016-02-14: qty 2
  Filled 2016-02-14 (×3): qty 7
  Filled 2016-02-14: qty 1
  Filled 2016-02-14: qty 2

## 2016-02-14 NOTE — BHH Group Notes (Signed)
[place] LCSW Group Therapy  02/14/2016 11:10 AM  Type of Therapy:  Group Therapy  Participation Level:  Active  Participation Quality:  Attentive  Affect:  Appropriate  Cognitive:  Alert and Oriented  Insight:  Improving  Engagement in Therapy:  Improving  Modes of Intervention:  Discussion, Education, Exploration, Problem-solving, Rapport Building, Socialization and Support  Summary of Progress/Problems: Today's Topic: Overcoming Obstacles. Patients identified one short term goal and potential obstacles in reaching this goal. Patients processed barriers involved in overcoming these obstacles. Patients identified steps necessary for overcoming these obstacles and explored motivation (internal and external) for facing these difficulties head on. Brett Beck was attentive and engaged. He reports feeling much better today and well rested. Patient states that he needs to work on coping skills and explored the family and community supports that he has when he discharges from the hospital. He is anticipating discharge for Friday and will return home. Patient is aware of his follow-up appts and discharge plan.    Mellissa Conley N Smart LCSW 02/14/2016, 11:10 AM

## 2016-02-14 NOTE — Progress Notes (Signed)
[facility] MD Progress Note  02/14/2016 2:25 PM Brett Beck  MRN:  [id number] Subjective:  "I feel a lot better today. I've been here beofre and I know I'm getting the help I need. My anxiety is very high though.   Objective: Pt seen and chart reviewed. Pt is alert/oriented x4, calm, cooperative, and appropriate to situation. Pt denies suicidal/homicidal ideation and psychosis and does not appear to be responding to internal stimuli. Pt reports that he has struggled with his addictions and has been inpatient before but feels he is improving this time in general. Pt continues to endorse severe anxiety and state that he wants something for this.    Principal Problem: MDD (major depressive disorder), recurrent severe, without psychosis ([place]) Diagnosis:   Patient Active Problem List   Diagnosis Date Noted  . MDD (major depressive disorder), recurrent severe, without psychosis ([place]) [F33.2] 02/13/2016    Priority: High  . Opiate withdrawal ([place]) [F11.23] 03/21/2015    Priority: High  . Substance induced mood disorder ([place]) [F19.94] 03/20/2015    Priority: High  . GAD (generalized anxiety disorder) [F41.1]     Priority: High  . Major depressive disorder, recurrent severe without psychotic features ([place]) [F33.2] 10/02/2015  . Anxiety [F41.9]   . Opioid use with withdrawal ([place]) [F11.93] 01/07/2015  . Moderate benzodiazepine use disorder ([place]) [F13.20] 01/06/2015  . Bullous emphysema ([place]) [J43.9] 11/30/2012  . COPD (chronic obstructive pulmonary disease) with emphysema ([place]) [J43.9] 11/02/2012  . Atypical chest pain [R07.89] 11/02/2012  . ANXIETY [F41.1] 08/01/2009  . DEPRESSION [F32.9] 08/01/2009  . ALLERGIC RHINITIS [J30.9] 08/01/2009  . PAIN IN JOINT, MULTIPLE SITES [M25.50] 08/01/2009  . BACK PAIN, LUMBAR, CHRONIC [M54.5] 08/01/2009  . HEADACHE [R51] 08/01/2009  . DIARRHEA, CHRONIC [R19.7] 08/01/2009   Total Time spent with patient: 15 minutes  Past Psychiatric History: see H&P  Past  Medical History:  Past Medical History:  Diagnosis Date  . Chronic back pain   . Degenerative disc disease   . Emphysema/COPD ([place])   . Opiate abuse, continuous   . Pneumothorax on left     Past Surgical History:  Procedure Laterality Date  . lung inflation     left lung  . LUNG SURGERY     Dr Arlyce Dice-- left lung  . NOSE SURGERY     broken nose   Family History:  Family History  Problem Relation Age of Onset  . Alpha-1 antitrypsin deficiency Mother   . Emphysema Mother   . Asthma Mother    Family Psychiatric  History: See H&P Social History:  History  Alcohol Use No     History  Drug Use  . Types: Oxycodone    Comment: opiates    Social History   Social History  . Marital status: Single    Spouse name: N/A  . Number of children: N/A  . Years of education: N/A   Occupational History  . N/A    Social History Main Topics  . Smoking status: Current Every Day Smoker    Packs/day: 1.50    Years: 12.00    Types: Cigarettes  . Smokeless tobacco: Never Used  . Alcohol use No  . Drug use:     Types: Oxycodone     Comment: opiates  . Sexual activity: Not Asked   Other Topics Concern  . None   Social History Narrative  . None   Additional Social History:  Sleep: Good  Appetite:  Good  Current Medications: Current Facility-Administered Medications  Medication Dose Route Frequency Provider Last Rate Last Dose  . acetaminophen (TYLENOL) tablet 650 mg  650 mg Oral Q6H PRN Laverle Hobby, PA-C   650 mg at 02/14/16 0854  . alum & mag hydroxide-simeth (MAALOX/MYLANTA) 200-200-20 MG/5ML suspension 30 mL  30 mL Oral Q4H PRN [name], NP      . cloNIDine (CATAPRES) tablet 0.1 mg  0.1 mg Oral BH-qamhs [name], NP       Followed by  . [START ON 02/16/2016] cloNIDine (CATAPRES) tablet 0.1 mg  0.1 mg Oral QAC breakfast [name], NP      . dicyclomine (BENTYL) tablet 20 mg  20 mg Oral Q6H PRN [name], NP    20 mg at 02/14/16 1217  . FLUoxetine (PROZAC) capsule 40 mg  40 mg Oral Daily [name], MD   40 mg at 02/14/16 0850  . hydrOXYzine (ATARAX/VISTARIL) tablet 25 mg  25 mg Oral Q6H PRN [name], NP   25 mg at 02/14/16 1216  . loperamide (IMODIUM) capsule 2-4 mg  2-4 mg Oral PRN [name], NP   2 mg at 02/14/16 1216  . magnesium hydroxide (MILK OF MAGNESIA) suspension 30 mL  30 mL Oral Daily PRN [name], NP      . methocarbamol (ROBAXIN) tablet 500 mg  500 mg Oral Q8H PRN [name], NP   500 mg at 02/14/16 0855  . nicotine polacrilex (NICORETTE) gum 2 mg  2 mg Oral PRN Laverle Hobby, PA-C   2 mg at 02/14/16 1314  . ondansetron (ZOFRAN-ODT) disintegrating tablet 4 mg  4 mg Oral Q6H PRN [name], NP   4 mg at 02/14/16 1314  . traZODone (DESYREL) tablet 150 mg  150 mg Oral QHS Laverle Hobby, PA-C   150 mg at 02/13/16 2248    Lab Results: No results found for this or any previous visit (from the past 38 hour(s)).  Blood Alcohol level:  Lab Results  Component Value Date   ETH <5 02/11/2016   ETH <5 [id number]    Metabolic Disorder Labs: No results found for: HGBA1C, MPG No results found for: PROLACTIN No results found for: CHOL, TRIG, HDL, CHOLHDL, VLDL, LDLCALC  Physical Findings: AIMS: Facial and Oral Movements Muscles of Facial Expression: None, normal Lips and Perioral Area: None, normal Jaw: None, normal Tongue: None, normal,Extremity Movements Upper (arms, wrists, hands, fingers): None, normal Lower (legs, knees, ankles, toes): None, normal, Trunk Movements Neck, shoulders, hips: None, normal, Overall Severity Severity of abnormal movements (highest score from questions above): None, normal Incapacitation due to abnormal movements: None, normal Patient's awareness of abnormal movements (rate only patient's report): No Awareness, Dental Status Current problems with teeth and/or dentures?: No Does patient usually wear dentures?: No   CIWA:    COWS:  COWS Total Score: 5  Musculoskeletal: Strength & Muscle Tone: within normal limits Gait & Station: normal Patient leans: N/A  Psychiatric Specialty Exam: Physical Exam  Review of Systems  Psychiatric/Behavioral: Positive for depression and substance abuse. Negative for suicidal ideas. The patient is nervous/anxious and has insomnia.   All other systems reviewed and are negative.   Blood pressure (!) 88/70, pulse 80, temperature 98.9 F (37.2 C), temperature source Oral, resp. rate 16, height 6' (1.829 m), weight 62.1 kg (137 lb).Body mass index is 18.58 kg/m.  General Appearance: Casual and  Fairly Groomed  Eye Contact:  Good  Speech:  Clear and Coherent and Normal Rate  Volume:  Normal  Mood:  Depressed  Affect:  Appropriate and Congruent  Thought Process:  Coherent, Goal Directed, Linear and Descriptions of Associations: Intact  Orientation:  Full (Time, Place, and Person)  Thought Content:  Focused on treatment options  Suicidal Thoughts:  No  Homicidal Thoughts:  No  Memory:  Immediate;   Fair Recent;   Fair Remote;   Fair  Judgement:  Fair  Insight:  Fair  Psychomotor Activity:  Normal  Concentration:  Concentration: Fair and Attention Span: Fair  Recall:  [employer] of Knowledge:  Fair  Language:  Fair  Akathisia:  No  Handed:    AIMS (if indicated):     Assets:  Communication Skills Desire for Improvement Resilience Social Support  ADL's:  Intact  Cognition:  WNL  Sleep:  Number of Hours: 6.25   Treatment Plan Summary: MDD (major depressive disorder), recurrent severe, without psychosis ([place]) with benzo/opiate abuse, unstable, managed as below:  Medications:  -clonidine/COWS protocol -Prozac 40mg  po daily for depression -Trazodone 150mg  po qhs prn insomnia -start buspar 7.5mg  po bid for anxiety  Brett Mola, FNP 02/14/2016, 2:25 PM   Agree with NP note as above  Neita Garnet, MD

## 2016-02-14 NOTE — [facility] Group Notes (Signed)
The focus of this group is to educate the patient on the purpose and policies of crisis stabilization and provide a format to answer questions about their admission.  The group details unit policies and expectations of patients while admitted.  Patient did not attend 0900 nurse education orientation group this morning.  Patient stayed in his room. 

## 2016-02-14 NOTE — Tx Team (Signed)
Interdisciplinary Treatment and Diagnostic Plan Update  02/14/2016 Time of Session: 9:30AM Brett Beck MRN: [id number]  Principal Diagnosis: Opiate withdrawal [facility])  Secondary Diagnoses: Principal Problem:   Opiate withdrawal ([place]) Active Problems:   MDD (major depressive disorder), recurrent severe, without psychosis ([place])   Current Medications:  Current Facility-Administered Medications  Medication Dose Route Frequency Provider Last Rate Last Dose  . acetaminophen (TYLENOL) tablet 650 mg  650 mg Oral Q6H PRN Laverle Hobby, PA-C   650 mg at 02/14/16 0854  . alum & mag hydroxide-simeth (MAALOX/MYLANTA) 200-200-20 MG/5ML suspension 30 mL  30 mL Oral Q4H PRN [name], NP      . cloNIDine (CATAPRES) tablet 0.1 mg  0.1 mg Oral BH-qamhs [name], NP       Followed by  . [START ON 02/16/2016] cloNIDine (CATAPRES) tablet 0.1 mg  0.1 mg Oral QAC breakfast [name], NP      . dicyclomine (BENTYL) tablet 20 mg  20 mg Oral Q6H PRN [name], NP   20 mg at 02/13/16 2338  . FLUoxetine (PROZAC) capsule 40 mg  40 mg Oral Daily [name], MD   40 mg at 02/14/16 0850  . hydrOXYzine (ATARAX/VISTARIL) tablet 25 mg  25 mg Oral Q6H PRN [name], NP   25 mg at 02/13/16 2338  . loperamide (IMODIUM) capsule 2-4 mg  2-4 mg Oral PRN [name], NP   2 mg at 02/14/16 0855  . magnesium hydroxide (MILK OF MAGNESIA) suspension 30 mL  30 mL Oral Daily PRN [name], NP      . methocarbamol (ROBAXIN) tablet 500 mg  500 mg Oral Q8H PRN [name], NP   500 mg at 02/14/16 0855  . nicotine polacrilex (NICORETTE) gum 2 mg  2 mg Oral PRN Laverle Hobby, PA-C   2 mg at 02/13/16 2330  . ondansetron (ZOFRAN-ODT) disintegrating tablet 4 mg  4 mg Oral Q6H PRN [name], NP   4 mg at 02/13/16 2340  . traZODone (DESYREL) tablet 150 mg  150 mg Oral QHS Laverle Hobby, PA-C   150 mg at 02/13/16 2248   PTA Medications: Prescriptions Prior to Admission  Medication  Sig Dispense Refill Last Dose  . FLUoxetine (PROZAC) 10 MG capsule Take 1 capsule (10 mg total) by mouth daily. (Patient not taking: Reported on 02/11/2016) 30 capsule 0 Not Taking at Unknown time  . FLUoxetine (PROZAC) 20 MG tablet Take 2 tablets (40 mg total) by mouth daily. 60 tablet 0 02/07/2016  . hydrOXYzine (ATARAX/VISTARIL) 50 MG tablet Take 1 tablet (50 mg total) by mouth every 6 (six) hours as needed for anxiety. (Patient not taking: Reported on 02/11/2016) 30 tablet 0 Not Taking at Unknown time  . loperamide (IMODIUM) 2 MG capsule Take 1 capsule (2 mg total) by mouth 4 (four) times daily as needed for diarrhea or loose stools. (Patient taking differently: Take 4 mg by mouth 4 (four) times daily as needed for diarrhea or loose stools. ) 12 capsule 0 02/11/2016 at Unknown time  . mometasone-formoterol (DULERA) 100-5 MCG/ACT AERO Inhale 2 puffs into the lungs 2 (two) times daily. (Patient not taking: Reported on 02/11/2016)   Not Taking at Unknown time  . ondansetron (ZOFRAN ODT) 4 MG disintegrating tablet Take 1 tablet (4 mg total) by mouth every 8 (eight) hours as needed for nausea or vomiting. 20 tablet 0 02/11/2016 at Unknown time  . traZODone (DESYREL)  100 MG tablet Take 1 tablet (100 mg total) by mouth at bedtime and may repeat dose one time if needed. (Patient not taking: Reported on 02/11/2016) 30 tablet 0 Not Taking at Unknown time  . traZODone (DESYREL) 150 MG tablet Take 1 tablet (150 mg total) by mouth at bedtime. 30 tablet 0 02/09/2016    Patient Stressors: Medication change or noncompliance Substance abuse  Patient Strengths: Ability for insight Average or above average intelligence Communication skills Motivation for treatment/growth Physical Health Supportive family/friends  Treatment Modalities: Medication Management, Group therapy, Case management,  1 to 1 session with clinician, Psychoeducation, Recreational therapy.   Physician Treatment Plan for Primary  Diagnosis: Opiate withdrawal ([place]) Long Term Goal(s): Improvement in symptoms so as ready for discharge Improvement in symptoms so as ready for discharge   Short Term Goals: Ability to demonstrate self-control will improve Ability to identify and develop effective coping behaviors will improve Ability to identify and develop effective coping behaviors will improve Ability to maintain clinical measurements within normal limits will improve  Medication Management: Evaluate patient's response, side effects, and tolerance of medication regimen.  Therapeutic Interventions: 1 to 1 sessions, Unit Group sessions and Medication administration.  Evaluation of Outcomes: Met  Physician Treatment Plan for Secondary Diagnosis: Principal Problem:   Opiate withdrawal ([place]) Active Problems:   MDD (major depressive disorder), recurrent severe, without psychosis ([place])  Long Term Goal(s): Improvement in symptoms so as ready for discharge Improvement in symptoms so as ready for discharge   Short Term Goals: Ability to demonstrate self-control will improve Ability to identify and develop effective coping behaviors will improve Ability to identify and develop effective coping behaviors will improve Ability to maintain clinical measurements within normal limits will improve     Medication Management: Evaluate patient's response, side effects, and tolerance of medication regimen.  Therapeutic Interventions: 1 to 1 sessions, Unit Group sessions and Medication administration.  Evaluation of Outcomes: Met   RN Treatment Plan for Primary Diagnosis: Opiate withdrawal ([place]) Long Term Goal(s): Knowledge of disease and therapeutic regimen to maintain health will improve  Short Term Goals: Ability to remain free from injury will improve, Ability to disclose and discuss suicidal ideas and Ability to identify and develop effective coping behaviors will improve  Medication Management: RN will administer  medications as ordered by provider, will assess and evaluate patient's response and provide education to patient for prescribed medication. RN will report any adverse and/or side effects to prescribing provider.  Therapeutic Interventions: 1 on 1 counseling sessions, Psychoeducation, Medication administration, Evaluate responses to treatment, Monitor vital signs and CBGs as ordered, Perform/monitor CIWA, COWS, AIMS and Fall Risk screenings as ordered, Perform wound care treatments as ordered.  Evaluation of Outcomes: Met   LCSW Treatment Plan for Primary Diagnosis: Opiate withdrawal ([place]) Long Term Goal(s): Safe transition to appropriate next level of care at discharge, Engage patient in therapeutic group addressing interpersonal concerns.  Short Term Goals: Engage patient in aftercare planning with referrals and resources, Facilitate patient progression through stages of change regarding substance use diagnoses and concerns and Identify triggers associated with mental health/substance abuse issues  Therapeutic Interventions: Assess for all discharge needs, 1 to 1 time with Social worker, Explore available resources and support systems, Assess for adequacy in community support network, Educate family and significant other(s) on suicide prevention, Complete Psychosocial Assessment, Interpersonal group therapy.  Evaluation of Outcomes: Met   Progress in Treatment: Attending groups:Yes Participating in groups: Yes Taking medication as prescribed: Yes. Toleration medication: Yes. Family/Significant  other contact made: SPE completed with pt; pt declined to consent to family contact.  Patient understands diagnosis: Yes. Discussing patient identified problems/goals with staff: Yes. Medical problems stabilized or resolved: Yes. Denies suicidal/homicidal ideation: Yes per self report and in group.  Issues/concerns per patient self-inventory: No. Other: n/a  New problem(s) identified: No,  Describe:  n/a  New Short Term/Long Term Goal(s): crisis stabilization, medication management, development of comprehensive mental wellness/sobriety plan; detox.   Discharge Plan or Barriers: Pt plans to return home; follow-up with Dr. Dareen Piano scheduled. [facility] for [place]. Pt provided with na/aa information and Mental [employer] informational pamphlet.   Reason for Continuation of Hospitalization: Medication management   Estimated Length of Stay: 1 day (pt discharge scheduled for Friday 02/15/16). Pt has a family member that can pick him up after lunch.   Attendees: Patient: 02/14/2016 11:06 AM  Physician: Dr. Parke Poisson MD; Dr. Modesta Messing MD 02/14/2016 11:06 AM  Nursing: Jay Schlichter RN 02/14/2016 11:06 AM  RN Care Manager: Lars Pinks CM 02/14/2016 11:06 AM  Social Worker: Maxie Better, LCSW; Erasmo Downer Drinkard LCSW; Adriana Reams LCSW 02/14/2016 11:06 AM  Recreational Therapist:  02/14/2016 11:06 AM  Other: Agustina Caroli NP; Catalina Pizza NP 02/14/2016 11:06 AM  Other:  02/14/2016 11:06 AM  Other: 02/14/2016 11:06 AM    Scribe for Treatment Team: [place], LCSW 02/14/2016 11:06 AM

## 2016-02-14 NOTE — Progress Notes (Signed)
  [facility] Adult Case Management Discharge Plan :  Will you be returning to the same living situation after discharge:  Yes,  home At discharge, do you have transportation home?: Yes,  family member-pt is scheduled for Friday discharge 02/15/16 Do you have the ability to pay for your medications: Yes,  mental health; out of pocket pay  Release of information consent forms completed and submitted to medical records by CSW.  Patient to Follow up at: Follow-up Information    [facility] Follow up on 02/19/2016.   Why:  Appt on this date at 2:45PM with Dr. Dareen Piano for hospital follow-up and medication management. Thank you.  Contact information: ATTN: Dr. Dareen Piano [facility] [place], [place] [address] Phone: [phone] Fax: [phone]       [facility] Outpatient Follow up.   Why:  If you are interested in Substance Abuse Intensive Outpatient Program or Individual therapy, please walk in for assessment Monday-Friday between 9AM-11:30AM. You will need to bring: Photo ID/Social Security card/any proof of income. Thank you.  Contact information: [address] [place], [place] [address] Phone: [phone] Fax: [phone]          Next level of care provider has access to [place] and Suicide Prevention discussed: Yes,  SPE completed with pt; pt declined to consent to family contact. SPI pamphlet and Mobile Crisis information provided to pt and he was encouraged to share information with support network, ask questions, and talk about any concerns relating to SPE.  Have you used any form of tobacco in the last 30 days? (Cigarettes, Smokeless Tobacco, Cigars, and/or Pipes): Yes  Has patient been referred to the Quitline?: Patient refused referral  Patient has been referred for addiction treatment: Yes  Rider Ermis N Smart 02/14/2016, 11:09 AM

## 2016-02-14 NOTE — Progress Notes (Signed)
D:Pt rates depression as a 4 and anxiety as a 6 on 0-10 scale with 10 being the most. Pt reports multiple detox symptoms. He has been out in the dayroom this afternoon with peers on the unit. A:Offered support, encouragement and 15 minute checks. Gave medications as ordered. R:Pt denies si and hi.Safety maintained on the unit.

## 2016-02-15 MED ORDER — FLUOXETINE HCL 40 MG PO CAPS: 40.0000 mg | ORAL_CAPSULE | Freq: Every day | ORAL | 0 refills | Status: DC

## 2016-02-15 MED ORDER — BUSPIRONE HCL 7.5 MG PO TABS: 7.5000 mg | ORAL_TABLET | Freq: Two times a day (BID) | ORAL | 0 refills | Status: DC

## 2016-02-15 MED ORDER — NICOTINE POLACRILEX 2 MG MT GUM: 2.0000 mg | CHEWING_GUM | OROMUCOSAL | 0 refills | Status: DC | PRN

## 2016-02-15 MED ORDER — HYDROXYZINE HCL 25 MG PO TABS: 25.0000 mg | ORAL_TABLET | Freq: Four times a day (QID) | ORAL | 0 refills | Status: DC | PRN

## 2016-02-15 MED ORDER — TRAZODONE HCL 150 MG PO TABS: 150.0000 mg | ORAL_TABLET | Freq: Every day | ORAL | 0 refills | Status: DC

## 2016-02-15 NOTE — BHH Suicide Risk Assessment (Signed)
Pt d/c from the hospital. All items returned. D/C instructions given, prescriptions given and samples given. Pt denies si and hi. 

## 2016-02-15 NOTE — Progress Notes (Signed)
Patient ID: Brett Beck, male   DOB: [birth year], 40 y.o.   MRN: [id number] D: Client is visible on the unit, seen in dayroom watching TV and playing cards, interacts appropriately with peers. Client reports anxiety "3-6" of 10, "it jumps around" client reports today's goal "to work on anxiety, interact more and I thing I did that been playing cards and went to the gymn. A: Writer provided emotional support, medications reviewed, administered as ordered. Staff will monitor q48min for safety. R: client is safe on the unit, attended group.

## 2016-02-15 NOTE — Progress Notes (Signed)
Recreation Therapy Notes  Date: 02/15/16 Time: 0930 Location: 300 Hall Group Room  Group Topic: Stress Management  Goal Area(s) Addresses:  Patient will verbalize importance of using healthy stress management.  Patient will identify positive emotions associated with healthy stress management.   Behavioral Response: Engaged  Intervention: Calm App  Activity :  Research officer, trade [employer].  LRT introduced the stress management technique of meditation to group.  LRT played the meditation from the Calm app to allow patients to focus on things they should be grateful for and not the things they don't have.  Patients were to follow along as the meditation played to engage in the technique.  Education:  Stress Management, Discharge Planning.   Education Outcome: Acknowledges edcuation/In group clarification offered/Needs additional education  Clinical Observations/Feedback: Pt attended group.   Victorino Sparrow, LRT/CTRS         Victorino Sparrow A 02/15/2016 11:34 AM

## 2016-02-15 NOTE — Discharge Summary (Signed)
Physician Discharge Summary Note  Patient:  Brett Beck is an 40 y.o., male MRN:  [id number] DOB:  May 21, 1975 Patient phone:  [phone] (home)  Patient address:   [place] [address],  Total Time spent with patient: 45 minutes  Date of Admission:  02/12/2016 Date of Discharge: 02/15/2016   Reason for Admission:   63year oldmale with opioid use disorder, depression, COPD who presents to ED voluntarily for opioid withdrawal and SI. Patient states that he came to ED as he experienced withdrawal symptoms after discontinuing opioid that day, and developed SI of "fed up with everything" without plans. He cannot think of any triggers of his depression but feels "despair." He reports that he started to use oxycodone since 2012 when he started to have back pain. He uses 200 mg every day, buying of the street. Although he initially overused for his back pain, he continues to use it to prevent withdrawal symptoms. He wants to stay away from medication and wants to be discharged before christmas. His longest sobriety was for 30 years, when he used to go to [employer] and stayed himself busy. He endorses anhedonia and insomnia. He feels anxious at times but denies panic attack. He denies AH/VH. He complains diarrhea, tremors, sweats and yawning. He has been on fluoxetine for 6 months with some effect.   Principal Problem: MDD (major depressive disorder), recurrent severe, without psychosis [facility]) Discharge Diagnoses: Patient Active Problem List   Diagnosis Date Noted  . MDD (major depressive disorder), recurrent severe, without psychosis ([place]) [F33.2] 02/13/2016    Priority: High  . Opiate withdrawal ([place]) [F11.23] 03/21/2015    Priority: High  . Substance induced mood disorder ([place]) [F19.94] 03/20/2015    Priority: High  . GAD (generalized anxiety disorder) [F41.1]     Priority: High  . Major depressive disorder, recurrent severe without psychotic features ([place]) [F33.2]  10/02/2015  . Anxiety [F41.9]   . Opioid use with withdrawal ([place]) [F11.93] 01/07/2015  . Moderate benzodiazepine use disorder ([place]) [F13.20] 01/06/2015  . Bullous emphysema ([place]) [J43.9] 11/30/2012  . COPD (chronic obstructive pulmonary disease) with emphysema ([place]) [J43.9] 11/02/2012  . Atypical chest pain [R07.89] 11/02/2012  . ANXIETY [F41.1] 08/01/2009  . DEPRESSION [F32.9] 08/01/2009  . ALLERGIC RHINITIS [J30.9] 08/01/2009  . PAIN IN JOINT, MULTIPLE SITES [M25.50] 08/01/2009  . BACK PAIN, LUMBAR, CHRONIC [M54.5] 08/01/2009  . HEADACHE [R51] 08/01/2009  . DIARRHEA, CHRONIC [R19.7] 08/01/2009    Past Psychiatric History: see H&P  Past Medical History:  Past Medical History:  Diagnosis Date  . Chronic back pain   . Degenerative disc disease   . Emphysema/COPD ([place])   . Opiate abuse, continuous   . Pneumothorax on left     Past Surgical History:  Procedure Laterality Date  . lung inflation     left lung  . LUNG SURGERY     Dr Arlyce Dice-- left lung  . NOSE SURGERY     broken nose   Family History:  Family History  Problem Relation Age of Onset  . Alpha-1 antitrypsin deficiency Mother   . Emphysema Mother   . Asthma Mother    Family Psychiatric  History: see H&P Social History:  History  Alcohol Use No     History  Drug Use  . Types: Oxycodone    Comment: opiates    Social History   Social History  . Marital status: Single    Spouse name: N/A  . Number of children: N/A  .  Years of education: N/A   Occupational History  . N/A    Social History Main Topics  . Smoking status: Current Every Day Smoker    Packs/day: 1.50    Years: 12.00    Types: Cigarettes  . Smokeless tobacco: Never Used  . Alcohol use No  . Drug use:     Types: Oxycodone     Comment: opiates  . Sexual activity: Not Asked   Other Topics Concern  . None   Social History Narrative  . None    Hospital Course:   Brett Beck was admitted for MDD (major depressive disorder),  recurrent severe, without psychosis ([place]) , with crisis management.  Pt was treated discharged with the medications listed below under Medication List.  Medical problems were identified and treated as needed.  Home medications were restarted as appropriate.  Improvement was monitored by observation and Brett Beck 's daily report of symptom reduction.  Emotional and mental status was monitored by daily self-inventory reports completed by Brett Beck and clinical staff.         Brett Beck was evaluated by the treatment team for stability and plans for continued recovery upon discharge. Brett Beck 's motivation was an integral factor for scheduling further treatment. Employment, transportation, bed availability, health status, family support, and any pending legal issues were also considered during hospital stay. Pt was offered further treatment options upon discharge including but not limited to Residential, Intensive Outpatient, and Outpatient treatment.  Brett Beck will follow up with the services as listed below under Follow Up Information.     Upon completion of this admission the patient was both mentally and medically stable for discharge denying suicidal/homicidal ideation, auditory/visual/tactile hallucinations, delusional thoughts and paranoia.    Brett Beck responded well to treatment with buspar, prozac, vistaril, nicotine, trazodone without adverse effects. Pt demonstrated improvement without reported or observed adverse effects to the point of stability appropriate for outpatient management. Pertinent labs include: UDS+ benzo/opiates. Reviewed CBC, CMP, BAL, and UDS; all unremarkable aside from noted exceptions.    Physical Findings: AIMS: Facial and Oral Movements Muscles of Facial Expression: None, normal Lips and Perioral Area: None, normal Jaw: None, normal Tongue: None, normal,Extremity Movements Upper (arms, wrists, hands, fingers): None, normal Lower (legs,  knees, ankles, toes): None, normal, Trunk Movements Neck, shoulders, hips: None, normal, Overall Severity Severity of abnormal movements (highest score from questions above): None, normal Incapacitation due to abnormal movements: None, normal Patient's awareness of abnormal movements (rate only patient's report): No Awareness, Dental Status Current problems with teeth and/or dentures?: No Does patient usually wear dentures?: No  CIWA:  CIWA-Ar Total: 2 COWS:  COWS Total Score: 5  Musculoskeletal: Strength & Muscle Tone: within normal limits Gait & Station: normal Patient leans: N/A  Psychiatric Specialty Exam: Physical Exam  Review of Systems  Psychiatric/Behavioral: Positive for depression and substance abuse. Negative for hallucinations and suicidal ideas. The patient is nervous/anxious and has insomnia.   All other systems reviewed and are negative.   Blood pressure 110/71, pulse (!) 102, temperature 98.8 F (37.1 C), temperature source Oral, resp. rate 16, height 6' (1.829 m), weight 62.1 kg (137 lb).Body mass index is 18.58 kg/m.  SEE MD PSE WITHIN SRA  Have you used any form of tobacco in the last 30 days? (Cigarettes, Smokeless Tobacco, Cigars, and/or Pipes): Yes  Has this patient used any form of tobacco in the last 30 days? (Cigarettes, Smokeless Tobacco, Cigars, and/or  Pipes) Yes, Yes, A prescription for an FDA-approved tobacco cessation medication was offered at discharge and the patient refused  Blood Alcohol level:  Lab Results  Component Value Date   ETH <5 02/11/2016   ETH <5 [id number]    Metabolic Disorder Labs:  No results found for: HGBA1C, MPG No results found for: PROLACTIN No results found for: CHOL, TRIG, HDL, CHOLHDL, VLDL, LDLCALC  See Psychiatric Specialty Exam and Suicide Risk Assessment completed by Attending Physician prior to discharge.  Discharge destination:  Home  Is patient on multiple antipsychotic therapies at discharge:  No   Has  Patient had three or more failed trials of antipsychotic monotherapy by history:  No  Recommended Plan for Multiple Antipsychotic Therapies: NA   Allergies as of 02/15/2016      Reactions   Effexor [venlafaxine] Diarrhea   Hydrocodone-acetaminophen    REACTION: severe headaches   Ibuprofen Other (See Comments)   Severe stomach issues   Lyrica [pregabalin] Diarrhea   Swelling in the feet   Neurontin [gabapentin] Other (See Comments)   Severe stomach issues   Nsaids Other (See Comments)   Severe stomach issues   Tramadol Hcl    REACTION: diarrhea, abdominal pain      Medication List    STOP taking these medications   loperamide 2 MG capsule Commonly known as:  IMODIUM   mometasone-formoterol 100-5 MCG/ACT Aero Commonly known as:  DULERA   ondansetron 4 MG disintegrating tablet Commonly known as:  ZOFRAN ODT     TAKE these medications     Indication  busPIRone 7.5 MG tablet Commonly known as:  BUSPAR Take 1 tablet (7.5 mg total) by mouth 2 (two) times daily.  Indication:  Anxiety Disorder   FLUoxetine 40 MG capsule Commonly known as:  PROZAC Take 1 capsule (40 mg total) by mouth daily. Start taking on:  02/16/2016 What changed:  medication strength  how much to take  Another medication with the same name was removed. Continue taking this medication, and follow the directions you see here.  Indication:  Major Depressive Disorder   hydrOXYzine 25 MG tablet Commonly known as:  ATARAX/VISTARIL Take 1 tablet (25 mg total) by mouth every 6 (six) hours as needed for anxiety. What changed:  medication strength  how much to take  Indication:  Anxiety Neurosis   nicotine polacrilex 2 MG gum Commonly known as:  NICORETTE Take 1 each (2 mg total) by mouth as needed for smoking cessation.  Indication:  Nicotine Addiction   traZODone 150 MG tablet Commonly known as:  DESYREL Take 1 tablet (150 mg total) by mouth at bedtime. What changed:  Another medication  with the same name was removed. Continue taking this medication, and follow the directions you see here.  Indication:  Trouble Sleeping      Follow-up Information    [facility] Follow up on 02/19/2016.   Why:  Appt on this date at 2:45PM with Dr. Dareen Piano for hospital follow-up and medication management. Thank you.  Contact information: ATTN: Dr. Dareen Piano [address] [place], [place] [address] Phone: [phone] Fax: [phone]       [facility] Outpatient Follow up.   Why:  If you are interested in Substance Abuse Intensive Outpatient Program or Individual therapy, please walk in for assessment Monday-Friday between 9AM-11:30AM. You will need to bring: Photo ID/Social Security card/any proof of income. Thank you.  Contact information: [address] [facility] [facility] [address] [place], [place] [address] Phone: [phone] Fax: [phone]  Follow-up recommendations:  Activity:  As tolerated Diet:  Heart healthy with low sodim  Comments:   Take all medications as prescribed. Keep all follow-up appointments as scheduled.  Do not consume alcohol or use illegal drugs while on prescription medications. Report any adverse effects from your medications to your primary care provider promptly.  In the event of recurrent symptoms or worsening symptoms, call 911, a crisis hotline, or go to the nearest emergency department for evaluation.    Signed: [name], FNP 02/15/2016, 9:28 AM

## 2016-02-15 NOTE — [facility] Suicide Risk Assessment (Signed)
[facility] Discharge Suicide Risk Assessment   Principal Problem: MDD (major depressive disorder), recurrent severe, without psychosis ([place]) Discharge Diagnoses:  Patient Active Problem List   Diagnosis Date Noted  . MDD (major depressive disorder), recurrent severe, without psychosis ([place]) [F33.2] 02/13/2016  . Major depressive disorder, recurrent severe without psychotic features ([place]) [F33.2] 10/02/2015  . Anxiety [F41.9]   . Opiate withdrawal ([place]) [F11.23] 03/21/2015  . Substance induced mood disorder ([place]) [F19.94] 03/20/2015  . GAD (generalized anxiety disorder) [F41.1]   . Opioid use with withdrawal ([place]) [F11.93] 01/07/2015  . Moderate benzodiazepine use disorder ([place]) [F13.20] 01/06/2015  . Bullous emphysema ([place]) [J43.9] 11/30/2012  . COPD (chronic obstructive pulmonary disease) with emphysema ([place]) [J43.9] 11/02/2012  . Atypical chest pain [R07.89] 11/02/2012  . ANXIETY [F41.1] 08/01/2009  . DEPRESSION [F32.9] 08/01/2009  . ALLERGIC RHINITIS [J30.9] 08/01/2009  . PAIN IN JOINT, MULTIPLE SITES [M25.50] 08/01/2009  . BACK PAIN, LUMBAR, CHRONIC [M54.5] 08/01/2009  . HEADACHE [R51] 08/01/2009  . DIARRHEA, CHRONIC [R19.7] 08/01/2009    Total Time spent with patient: 20 minutes  Musculoskeletal: Strength & Muscle Tone: within normal limits Gait & Station: normal Patient leans: N/A  Psychiatric Specialty Exam: Review of Systems  Psychiatric/Behavioral: Positive for substance abuse. Negative for depression, hallucinations and suicidal ideas. The patient is not nervous/anxious and does not have insomnia.   All other systems reviewed and are negative.   Blood pressure 110/71, pulse (!) 102, temperature 98.8 F (37.1 C), temperature source Oral, resp. rate 16, height 6' (1.829 m), weight 137 lb (62.1 kg).Body mass index is 18.58 kg/m.  General Appearance: Casual  Eye Contact::  Good  Speech:  Clear and Coherent  Volume:  Normal  Mood:  "good"  Affect:  Restricted  Thought  Process:  Coherent and Goal Directed  Orientation:  Full (Time, Place, and Person)  Thought Content:  Logical Perceptions: denies AH/VH  Suicidal Thoughts:  No  Homicidal Thoughts:  No  Memory:  Immediate;   Good Recent;   Good Remote;   Good  Judgement:  Good  Insight:  Good  Psychomotor Activity:  Normal  Concentration:  Good  Recall:  Good  Fund of Knowledge:Good  Language: Good  Akathisia:  No  Handed:  Right  AIMS (if indicated):     Assets:  Communication Skills Desire for Improvement  Sleep:  Number of Hours: 6  Cognition: WNL  ADL's:  Intact   Mental Status Per Nursing Assessment::   On Admission:  Suicidal ideation indicated by patient, Self-harm thoughts, Self-harm behaviors  Demographic Factors:  Male and Caucasian  Loss Factors: NA  Historical Factors: NA  Risk Reduction Factors:   Living with another person, especially a relative and Positive social support  Continued Clinical Symptoms:  Alcohol/Substance Abuse/Dependencies  Cognitive Features That Contribute To Risk:  Closed-mindedness    Suicide Risk:  Mild:  Suicidal ideation of limited frequency, intensity, duration, and specificity.  There are no identifiable plans, no associated intent, mild dysphoria and related symptoms, good self-control (both objective and subjective assessment), few other risk factors, and identifiable protective factors, including available and accessible social support.  Follow-up Information    [facility] Follow up on 02/19/2016.   Why:  Appt on this date at 2:45PM with Dr. Dareen Piano for hospital follow-up and medication management. Thank you.  Contact information: ATTN: Dr. Dareen Piano [place],  [address] Phone: [phone] Fax: [phone]       [facility] Outpatient Follow up.   Why:  If you are interested in Substance Abuse Intensive Outpatient Program or Individual therapy, please walk in for assessment Monday-Friday  between 9AM-11:30AM. You will need to bring: Photo ID/Social Security card/any proof of income. Thank you.  Contact information: [address] [place], [place] [address] Phone: [phone] Fax: [phone]         Patient will be discharged to her sister's place. He denies any SI as he does not experience any pain from opiate withdrawal anymore. He is motivated for sobriety and is future oriented. He agrees for follow up appointment as above. He denies gun access at home.   Plan Of Care/Follow-up recommendations:  Activity:  regular Diet:  regular Tests:  n/a Other:  n/a  Norman Clay, MD 02/15/2016, 10:19 AM

## 2016-05-26 ENCOUNTER — Emergency Department (HOSPITAL_BASED_OUTPATIENT_CLINIC_OR_DEPARTMENT_OTHER): Payer: Self-pay

## 2016-05-26 ENCOUNTER — Encounter (HOSPITAL_BASED_OUTPATIENT_CLINIC_OR_DEPARTMENT_OTHER): Payer: Self-pay | Admitting: Emergency Medicine

## 2016-05-26 ENCOUNTER — Emergency Department (HOSPITAL_BASED_OUTPATIENT_CLINIC_OR_DEPARTMENT_OTHER)
Admission: EM | Admit: 2016-05-26 | Discharge: 2016-05-27 | Disposition: A | Discharge status: Home / Self Care | Payer: Self-pay | Attending: Emergency Medicine | Admitting: Emergency Medicine

## 2016-05-26 DIAGNOSIS — J449 Chronic obstructive pulmonary disease, unspecified: Secondary | ICD-10-CM | POA: Insufficient documentation

## 2016-05-26 DIAGNOSIS — Z79899 Other long term (current) drug therapy: Secondary | ICD-10-CM | POA: Insufficient documentation

## 2016-05-26 DIAGNOSIS — R072 Precordial pain: Secondary | ICD-10-CM | POA: Insufficient documentation

## 2016-05-26 DIAGNOSIS — R0602 Shortness of breath: Secondary | ICD-10-CM | POA: Insufficient documentation

## 2016-05-26 DIAGNOSIS — F17210 Nicotine dependence, cigarettes, uncomplicated: Secondary | ICD-10-CM | POA: Insufficient documentation

## 2016-05-26 LAB — CBC WITH DIFFERENTIAL/PLATELET
BASOS ABS: 0 10*3/uL (ref 0.0–0.1)
Basophils Relative: 0 %
Eosinophils Absolute: 0.1 10*3/uL (ref 0.0–0.7)
Eosinophils Relative: 1 %
HEMATOCRIT: 48.3 % (ref 39.0–52.0)
Hemoglobin: 16.6 g/dL (ref 13.0–17.0)
LYMPHS PCT: 12 %
Lymphs Abs: 1.9 10*3/uL (ref 0.7–4.0)
MCH: 32.5 pg (ref 26.0–34.0)
MCHC: 34.4 g/dL (ref 30.0–36.0)
MCV: 94.5 fL (ref 78.0–100.0)
Monocytes Absolute: 1.1 10*3/uL — ABNORMAL HIGH (ref 0.1–1.0)
Monocytes Relative: 7 %
NEUTROS ABS: 12.2 10*3/uL — AB (ref 1.7–7.7)
Neutrophils Relative %: 80 %
PLATELETS: 352 10*3/uL (ref 150–400)
RBC: 5.11 MIL/uL (ref 4.22–5.81)
RDW: 12.6 % (ref 11.5–15.5)
WBC: 15.2 10*3/uL — AB (ref 4.0–10.5)

## 2016-05-26 MED ORDER — IBUPROFEN 800 MG PO TABS
800.0000 mg | ORAL_TABLET | Freq: Once | ORAL | Status: AC
  Administered 2016-05-26: 800 mg via ORAL
  Filled 2016-05-26: qty 1

## 2016-05-26 MED ORDER — SODIUM CHLORIDE 0.9 % IV BOLUS (SEPSIS)
500.0000 mL | Freq: Once | INTRAVENOUS | Status: AC
  Administered 2016-05-26: 500 mL via INTRAVENOUS

## 2016-05-26 NOTE — ED Notes (Signed)
NAD-steady gait
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
## 2016-05-26 NOTE — ED Provider Notes (Signed)
Emergency Department Provider Note  By signing my name below, I, Dora Sims, attest that this documentation has been prepared under the direction and in the presence of physician practitioner, Margette Fast, MD. Electronically Signed: Dora Sims, Scribe. 05/26/2016. 11:28 PM.  I have reviewed the triage vital signs and the nursing notes.   HISTORY  Chief Complaint Chest Pain and Shortness of Breath  HPI Comments: Brett Beck is a 41 y.o. male with PMHx including emphysema/COPD and pneumothorax who presents to the Emergency Department complaining of constant, gradually worsening, stabbing, right-sided chest pain beginning two days ago. He states his pain became severe today. Pt reports some associated dyspnea on exertion. Patient states his chest pain is worse with movement and applied pressure to the right side of his chest. No recent trauma to his chest or over-exertion. Pt has tried Tylenol with no significant improvement of his chest pain. No h/o PE or DVT. He is a regular smoker. Pt notes that NSAID's "bother my stomach". He denies leg swelling or any other associated symptoms.  Past Medical History:  Diagnosis Date  . Chronic back pain   . Degenerative disc disease   . Emphysema/COPD ([place])   . Opiate abuse, continuous   . Pneumothorax on left     Patient Active Problem List   Diagnosis Date Noted  . MDD (major depressive disorder), recurrent severe, without psychosis ([place]) 02/13/2016  . Major depressive disorder, recurrent severe without psychotic features ([place]) 10/02/2015  . Anxiety   . Opiate withdrawal ([place]) 03/21/2015  . Substance induced mood disorder ([place]) 03/20/2015  . GAD (generalized anxiety disorder)   . Opioid use with withdrawal ([place]) 01/07/2015  . Moderate benzodiazepine use disorder ([place]) 01/06/2015  . Bullous emphysema ([place]) 11/30/2012  . COPD (chronic obstructive pulmonary disease) with emphysema ([place]) 11/02/2012  . Atypical chest pain 11/02/2012   . ANXIETY 08/01/2009  . DEPRESSION 08/01/2009  . ALLERGIC RHINITIS 08/01/2009  . PAIN IN JOINT, MULTIPLE SITES 08/01/2009  . BACK PAIN, LUMBAR, CHRONIC 08/01/2009  . HEADACHE 08/01/2009  . DIARRHEA, CHRONIC 08/01/2009    Past Surgical History:  Procedure Laterality Date  . lung inflation     left lung  . LUNG SURGERY     Dr Arlyce Dice-- left lung  . NOSE SURGERY     broken nose    Current Outpatient Rx  . Order #: [id number] Class: Print  . Order #: [id number] Class: Print  . Order #: [id number] Class: Print  . Order #: [id number] Class: Print  . Order #: [id number] Class: Print  . Order #: [id number] Class: Normal  . Order #: [id number] Class: Print    Allergies Effexor [venlafaxine]; Hydrocodone-acetaminophen; Ibuprofen; Lyrica [pregabalin]; Neurontin [gabapentin]; Nsaids; and Tramadol hcl  Family History  Problem Relation Age of Onset  . Alpha-1 antitrypsin deficiency Mother   . Emphysema Mother   . Asthma Mother     Social History Social History  Substance Use Topics  . Smoking status: Current Every Day Smoker    Packs/day: 1.50    Years: 12.00    Types: Cigarettes  . Smokeless tobacco: Never Used  . Alcohol use No    Review of Systems  10-point ROS otherwise negative.  ____________________________________________   PHYSICAL EXAM:  VITAL SIGNS: ED Triage Vitals  Enc Vitals Group     BP 05/26/16 1958 (!) 110/94     Pulse Rate 05/26/16 1958 (!) 118     Resp 05/26/16 1958 16     Temp 05/26/16 1958 98.9 F (37.2 C)  Temp Source 05/26/16 1958 Oral     SpO2 05/26/16 1958 93 %     Weight 05/26/16 2000 140 lb (63.5 kg)     Height 05/26/16 2000 6\' 2"  (1.88 m)     Pain Score 05/26/16 2004 9   Constitutional: Alert and oriented. Appears slightly uncomfortable.  Eyes: Conjunctivae are normal.  Head: Atraumatic. Nose: No congestion/rhinnorhea. Mouth/Throat: Mucous membranes are moist. Neck: No stridor. Cardiovascular: Sinus tachycardia. Good peripheral  circulation. Grossly normal heart sounds.   Respiratory: Normal respiratory effort.  No retractions. Lungs CTAB. Gastrointestinal: Soft and nontender. No distention.  Musculoskeletal: No lower extremity tenderness nor edema. No gross deformities of extremities. Tenderness to the right anterior chest wall.  Neurologic:  Normal speech and language. No gross focal neurologic deficits are appreciated.  Skin:  Skin is warm, dry and intact. No rash noted. Psychiatric: Mood and affect are normal. Speech and behavior are normal.  ____________________________________________   LABS (all labs ordered are listed, but only abnormal results are displayed)  Labs Reviewed  BASIC METABOLIC PANEL - Abnormal; Notable for the following:       Result Value   Sodium 134 (*)    Chloride 96 (*)    Glucose, Bld 112 (*)    All other components within normal limits  CBC WITH DIFFERENTIAL/PLATELET - Abnormal; Notable for the following:    WBC 15.2 (*)    Neutro Abs 12.2 (*)    Monocytes Absolute 1.1 (*)    All other components within normal limits  TROPONIN I   ____________________________________________  EKG   EKG Interpretation  Date/Time:  Monday May 26 2016 20:00:28 EDT Ventricular Rate:  119 PR Interval:  146 QRS Duration: 72 QT Interval:  320 QTC Calculation: 450 R Axis:   85 Text Interpretation:  Sinus tachycardia Biatrial enlargement Abnormal ECG No STEMI.  Confirmed by Scheryl Sanborn MD, Bram Hottel [phone]) on 05/26/2016 11:32:30 PM       ____________________________________________  RADIOLOGY  Dg Chest 2 View  Result Date: 05/26/2016 CLINICAL DATA:  Right upper chest pain for 3 days with shortness of breath EXAM: CHEST  2 VIEW COMPARISON:  December 17, 2014 FINDINGS: The heart size and mediastinal contours are within normal limits. The lungs are hyperinflated. There is no focal infiltrate, pulmonary edema, or pleural effusion. The visualized skeletal structures are unremarkable. IMPRESSION: No  active cardiopulmonary disease.  COPD. Electronically Signed   By: Abelardo Diesel M.D.   On: 05/26/2016 20:42   Ct Angio Chest Pe W And/or Wo Contrast  Result Date: 05/27/2016 CLINICAL DATA:  41 y/o M; right-sided chest pain and shortness of breath for 2 days. EXAM: CT ANGIOGRAPHY CHEST WITH CONTRAST TECHNIQUE: Multidetector CT imaging of the chest was performed using the standard protocol during bolus administration of intravenous contrast. Multiplanar CT image reconstructions and MIPs were obtained to evaluate the vascular anatomy. CONTRAST:  100 cc Isovue 370 COMPARISON:  11/19/2012 CT of the chest. FINDINGS: Cardiovascular: Satisfactory opacification of the pulmonary arteries to the segmental level. No evidence of pulmonary embolism. Normal heart size. No pericardial effusion. Mediastinum/Nodes: No enlarged mediastinal, hilar, or axillary lymph nodes. Thyroid gland, trachea, and esophagus demonstrate no significant findings. Lungs/Pleura: Severe bullous emphysema greatest in the right lung apex and left lung base. No consolidation, pneumothorax, or pleural effusion. Upper Abdomen: No acute abnormality. Musculoskeletal: No chest wall abnormality. No acute or significant osseous findings. Review of the MIP images confirms the above findings. IMPRESSION: 1. No evidence of acute pulmonary embolus. 2. No acute  cardiopulmonary abnormality identified. 3. Severe bullous emphysema greatest in right lung apex and left lung base. Electronically Signed   By: Kristine Garbe M.D.   On: 05/27/2016 01:55    ____________________________________________   PROCEDURES  Procedure(s) performed:   Procedures  None ____________________________________________   INITIAL IMPRESSION / ASSESSMENT AND PLAN / ED COURSE  Pertinent labs & imaging results that were available during my care of the patient were reviewed by me and considered in my medical decision making (see chart for details).  Patient presents  to the emergency department for evaluation of right-sided chest pain is worse with deep breathing. Patient has significant tenderness to palpation over the right anterior chest wall. No erythema or evidence of infection. Chest x-ray with no pneumothorax or obvious rib fracture. Patient is a mild leukocytosis but lab work is otherwise unremarkable. Troponin pending. EKG unremarkable. She does smoke cigarettes and has some associated tachycardia and hypoxemia. Plan for CT scan of the chest to evaluate for pulmonary embolism.   I counseled the patient on smoking cessation.   02:00 AM CT angio negative. Troponin normal. Plan for discharge with treatment for MSK pain. Discussed PCP follow up.   At this time, I do not feel there is any life-threatening condition present. I have reviewed and discussed all results (EKG, imaging, lab, urine as appropriate), exam findings with patient. I have reviewed nursing notes and appropriate previous records.  I feel the patient is safe to be discharged home without further emergent workup. Discussed usual and customary return precautions. Patient and family (if present) verbalize understanding and are comfortable with this plan.  Patient will follow-up with their primary care provider. If they do not have a primary care provider, information for follow-up has been provided to them. All questions have been answered.  ____________________________________________  FINAL CLINICAL IMPRESSION(S) / ED DIAGNOSES  Final diagnoses:  Precordial chest pain  Shortness of breath     MEDICATIONS GIVEN DURING THIS VISIT:  Medications  sodium chloride 0.9 % bolus 500 mL (0 mLs Intravenous Stopped 05/27/16 0041)  ibuprofen (ADVIL,MOTRIN) tablet 800 mg (800 mg Oral Given 05/26/16 2355)  iopamidol (ISOVUE-370) 76 % injection 100 mL (100 mLs Intravenous Contrast Given 05/27/16 0046)     NEW OUTPATIENT MEDICATIONS STARTED DURING THIS VISIT:  Discharge Medication List as of 05/27/2016   2:12 AM    START taking these medications   Details  lidocaine (LIDODERM) 5 % Place 1 patch onto the skin daily. Remove & Discard patch within 12 hours or as directed by MD, Starting Tue 05/27/2016, Print    naproxen (NAPROSYN) 500 MG tablet Take 1 tablet (500 mg total) by mouth 2 (two) times daily with a meal., Starting Tue 05/27/2016, Print         Note:  This document was prepared using Dragon voice recognition software and may include unintentional dictation errors.  Nanda Quinton, MD Emergency Medicine  I personally performed the services described in this documentation, which was scribed in my presence. The recorded information has been reviewed and is accurate.      Margette Fast, MD 05/27/16 [phone]

## 2016-05-26 NOTE — ED Notes (Signed)
ED Provider at bedside. 

## 2016-05-27 LAB — BASIC METABOLIC PANEL
Anion gap: 6 (ref 5–15)
BUN: 6 mg/dL (ref 6–20)
CALCIUM: 9.3 mg/dL (ref 8.9–10.3)
CHLORIDE: 96 mmol/L — AB (ref 101–111)
CO2: 32 mmol/L (ref 22–32)
Creatinine, Ser: 1.08 mg/dL (ref 0.61–1.24)
GFR calc non Af Amer: >60 mL/min (ref >60)
GLUCOSE: 112 mg/dL — AB (ref 65–99)
POTASSIUM: 3.5 mmol/L (ref 3.5–5.1)
Sodium: 134 mmol/L — ABNORMAL LOW (ref 135–145)

## 2016-05-27 LAB — TROPONIN I: Troponin I: <0.03 ng/mL (ref <0.03)

## 2016-05-27 MED ORDER — NAPROXEN 500 MG PO TABS: 500.0000 mg | ORAL_TABLET | Freq: Two times a day (BID) | ORAL | 0 refills | Status: DC

## 2016-05-27 MED ORDER — LIDOCAINE 5 % EX PTCH: 1.0000 | MEDICATED_PATCH | CUTANEOUS | 0 refills | Status: DC

## 2016-05-27 MED ORDER — IOPAMIDOL (ISOVUE-370) INJECTION 76%
100.0000 mL | Freq: Once | INTRAVENOUS | Status: AC | PRN
  Administered 2016-05-27: 100 mL via INTRAVENOUS

## 2016-05-27 NOTE — Discharge Instructions (Signed)

## 2016-05-27 NOTE — ED Notes (Signed)
Patient transported to CT 

## 2017-11-15 IMAGING — CR DG CHEST 2V
2 series · 2 of 2 positions shown · non-contrast
Comparison: December 17, 2014

CLINICAL DATA: Right upper chest pain for 3 days with shortness of
breath

EXAM:
CHEST  2 VIEW

[w chest pa]
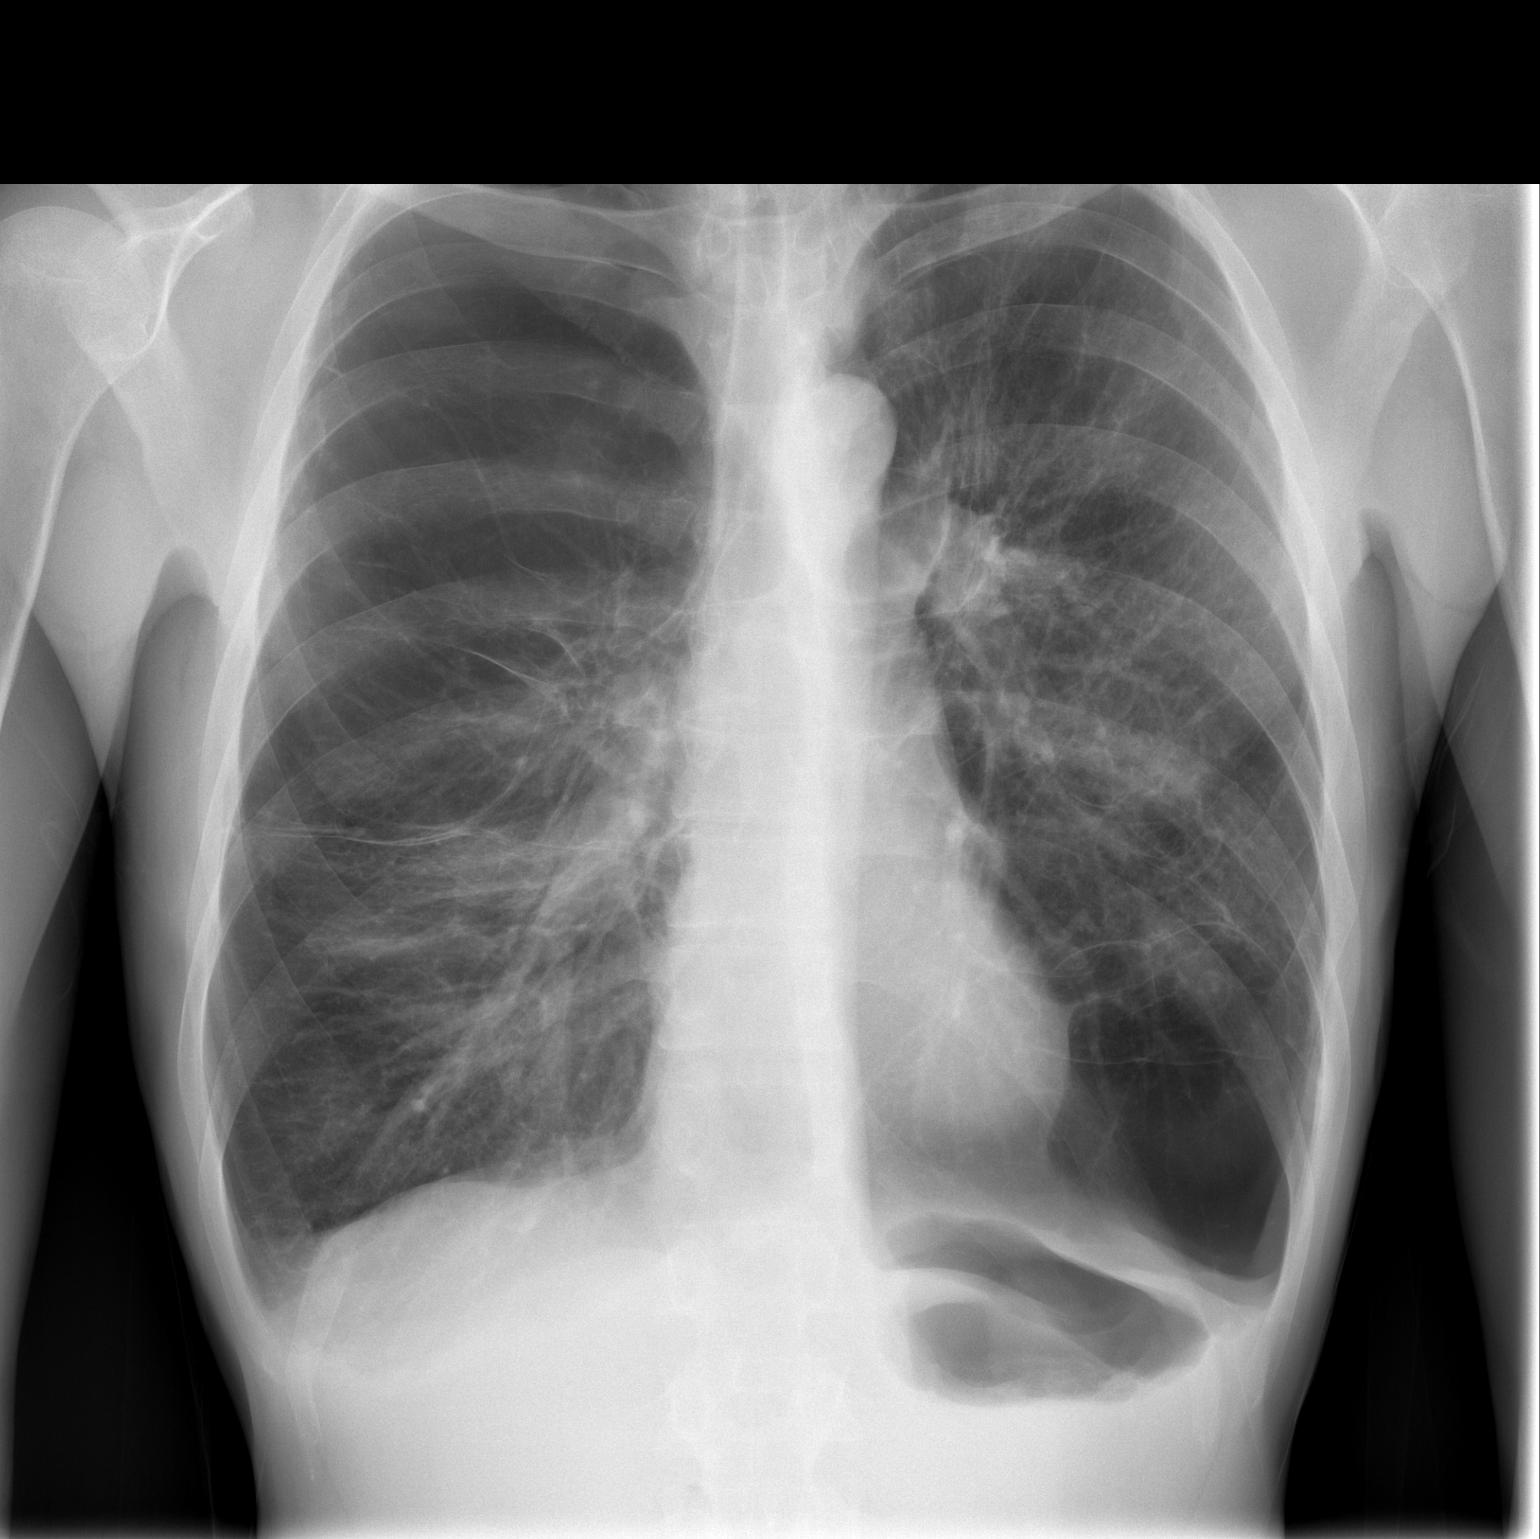

[w chest lat]
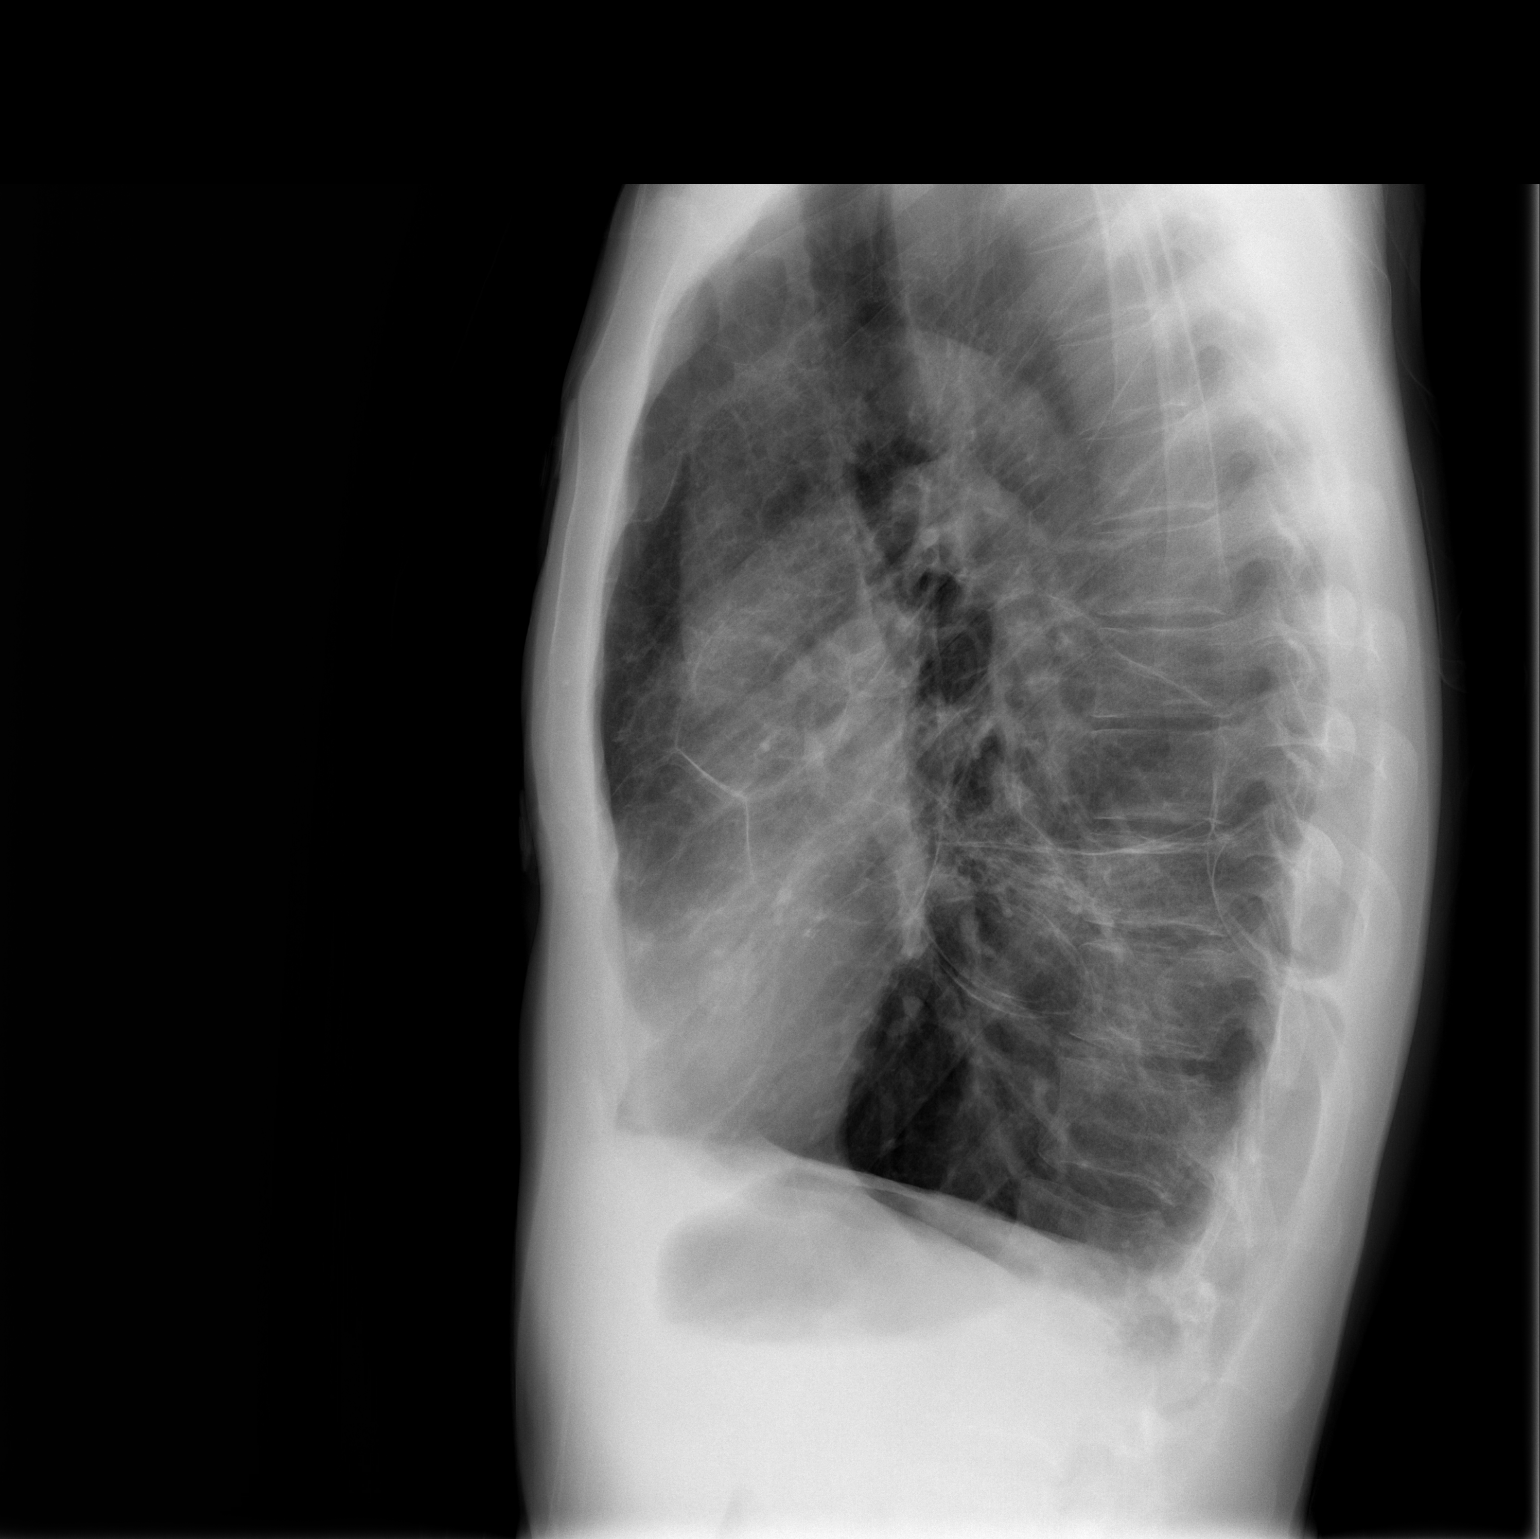

[2 of 2 positions shown; findings below may reference images not displayed]

FINDINGS: The heart size and mediastinal contours are within normal limits.
The lungs are hyperinflated. There is no focal infiltrate, pulmonary
edema, or pleural effusion. The visualized skeletal structures are
unremarkable.
IMPRESSION: No active cardiopulmonary disease.  COPD.

## 2021-07-19 ENCOUNTER — Emergency Department (HOSPITAL_COMMUNITY): Payer: Self-pay

## 2021-07-19 ENCOUNTER — Other Ambulatory Visit: Payer: Self-pay

## 2021-07-19 ENCOUNTER — Encounter (HOSPITAL_COMMUNITY): Payer: Self-pay

## 2021-07-19 ENCOUNTER — Inpatient Hospital Stay (HOSPITAL_COMMUNITY)
Admission: EM | Admit: 2021-07-19 | Discharge: 2021-07-25 | DRG: 193 | Disposition: A | Discharge status: Home / Self Care | Payer: Self-pay | Attending: Internal Medicine | Admitting: Internal Medicine

## 2021-07-19 DIAGNOSIS — Z886 Allergy status to analgesic agent status: Secondary | ICD-10-CM

## 2021-07-19 DIAGNOSIS — Z681 Body mass index (BMI) 19 or less, adult: Secondary | ICD-10-CM

## 2021-07-19 DIAGNOSIS — F32A Depression, unspecified: Secondary | ICD-10-CM | POA: Diagnosis present

## 2021-07-19 DIAGNOSIS — F17210 Nicotine dependence, cigarettes, uncomplicated: Secondary | ICD-10-CM | POA: Diagnosis present

## 2021-07-19 DIAGNOSIS — Z720 Tobacco use: Secondary | ICD-10-CM

## 2021-07-19 DIAGNOSIS — Z885 Allergy status to narcotic agent status: Secondary | ICD-10-CM

## 2021-07-19 DIAGNOSIS — Z79899 Other long term (current) drug therapy: Secondary | ICD-10-CM

## 2021-07-19 DIAGNOSIS — J439 Emphysema, unspecified: Secondary | ICD-10-CM | POA: Diagnosis present

## 2021-07-19 DIAGNOSIS — E43 Unspecified severe protein-calorie malnutrition: Secondary | ICD-10-CM | POA: Diagnosis present

## 2021-07-19 DIAGNOSIS — R64 Cachexia: Secondary | ICD-10-CM | POA: Diagnosis present

## 2021-07-19 DIAGNOSIS — J9612 Chronic respiratory failure with hypercapnia: Secondary | ICD-10-CM | POA: Diagnosis present

## 2021-07-19 DIAGNOSIS — J189 Pneumonia, unspecified organism: Principal | ICD-10-CM | POA: Diagnosis present

## 2021-07-19 DIAGNOSIS — Z825 Family history of asthma and other chronic lower respiratory diseases: Secondary | ICD-10-CM

## 2021-07-19 DIAGNOSIS — J441 Chronic obstructive pulmonary disease with (acute) exacerbation: Secondary | ICD-10-CM

## 2021-07-19 DIAGNOSIS — Z888 Allergy status to other drugs, medicaments and biological substances status: Secondary | ICD-10-CM

## 2021-07-19 DIAGNOSIS — F411 Generalized anxiety disorder: Secondary | ICD-10-CM | POA: Diagnosis present

## 2021-07-19 DIAGNOSIS — J9601 Acute respiratory failure with hypoxia: Secondary | ICD-10-CM | POA: Diagnosis present

## 2021-07-19 DIAGNOSIS — E861 Hypovolemia: Secondary | ICD-10-CM | POA: Diagnosis present

## 2021-07-19 DIAGNOSIS — E871 Hypo-osmolality and hyponatremia: Secondary | ICD-10-CM | POA: Diagnosis present

## 2021-07-19 DIAGNOSIS — Z20822 Contact with and (suspected) exposure to covid-19: Secondary | ICD-10-CM | POA: Diagnosis present

## 2021-07-19 LAB — COMPREHENSIVE METABOLIC PANEL
ALT: 16 U/L (ref 0–44)
AST: 18 U/L (ref 15–41)
Albumin: 3.9 g/dL (ref 3.5–5.0)
Alkaline Phosphatase: 100 U/L (ref 38–126)
Anion gap: 9 (ref 5–15)
BUN: 9 mg/dL (ref 6–20)
CO2: 29 mmol/L (ref 22–32)
Calcium: 9.1 mg/dL (ref 8.9–10.3)
Chloride: 97 mmol/L — ABNORMAL LOW (ref 98–111)
Creatinine, Ser: 1 mg/dL (ref 0.61–1.24)
GFR, Estimated: >60 mL/min (ref >60)
Glucose, Bld: 126 mg/dL — ABNORMAL HIGH (ref 70–99)
Potassium: 3.8 mmol/L (ref 3.5–5.1)
Sodium: 135 mmol/L (ref 135–145)
Total Bilirubin: 1.1 mg/dL (ref 0.3–1.2)
Total Protein: 7.3 g/dL (ref 6.5–8.1)

## 2021-07-19 LAB — CBC WITH DIFFERENTIAL/PLATELET
Abs Immature Granulocytes: 0.07 10*3/uL (ref 0.00–0.07)
Basophils Absolute: 0 10*3/uL (ref 0.0–0.1)
Basophils Relative: 0 %
Eosinophils Absolute: 0 10*3/uL (ref 0.0–0.5)
Eosinophils Relative: 0 %
HCT: 45.8 % (ref 39.0–52.0)
Hemoglobin: 15.9 g/dL (ref 13.0–17.0)
Immature Granulocytes: 0 %
Lymphocytes Relative: 2 %
Lymphs Abs: 0.4 10*3/uL — ABNORMAL LOW (ref 0.7–4.0)
MCH: 33.5 pg (ref 26.0–34.0)
MCHC: 34.7 g/dL (ref 30.0–36.0)
MCV: 96.6 fL (ref 80.0–100.0)
Monocytes Absolute: 1 10*3/uL (ref 0.1–1.0)
Monocytes Relative: 5 %
Neutro Abs: 18 10*3/uL — ABNORMAL HIGH (ref 1.7–7.7)
Neutrophils Relative %: 93 %
Platelets: 261 10*3/uL (ref 150–400)
RBC: 4.74 MIL/uL (ref 4.22–5.81)
RDW: 12.8 % (ref 11.5–15.5)
WBC: 19.4 10*3/uL — ABNORMAL HIGH (ref 4.0–10.5)
nRBC: 0 % (ref 0.0–0.2)

## 2021-07-19 LAB — STREP PNEUMONIAE URINARY ANTIGEN: Strep Pneumo Urinary Antigen: NEGATIVE

## 2021-07-19 LAB — CBC
HCT: 42.4 % (ref 39.0–52.0)
Hemoglobin: 14.6 g/dL (ref 13.0–17.0)
MCH: 33.6 pg (ref 26.0–34.0)
MCHC: 34.4 g/dL (ref 30.0–36.0)
MCV: 97.5 fL (ref 80.0–100.0)
Platelets: 258 10*3/uL (ref 150–400)
RBC: 4.35 MIL/uL (ref 4.22–5.81)
RDW: 13 % (ref 11.5–15.5)
WBC: 20.1 10*3/uL — ABNORMAL HIGH (ref 4.0–10.5)
nRBC: 0 % (ref 0.0–0.2)

## 2021-07-19 LAB — CREATININE, SERUM
Creatinine, Ser: 1.05 mg/dL (ref 0.61–1.24)
GFR, Estimated: >60 mL/min (ref >60)

## 2021-07-19 LAB — LACTIC ACID, PLASMA
Lactic Acid, Venous: 1.4 mmol/L (ref 0.5–1.9)
Lactic Acid, Venous: 1.3 mmol/L (ref 0.5–1.9)

## 2021-07-19 LAB — BLOOD GAS, VENOUS
Acid-Base Excess: 8.7 mmol/L — ABNORMAL HIGH (ref 0.0–2.0)
Bicarbonate: 35 mmol/L — ABNORMAL HIGH (ref 20.0–28.0)
O2 Saturation: 24.9 %
Patient temperature: 37
pCO2, Ven: 54 mmHg (ref 44–60)
pH, Ven: 7.42 (ref 7.25–7.43)
pO2, Ven: <31 mmHg — CL (ref 32–45)

## 2021-07-19 LAB — SARS CORONAVIRUS 2 BY RT PCR: SARS Coronavirus 2 by RT PCR: NEGATIVE

## 2021-07-19 LAB — MRSA NEXT GEN BY PCR, NASAL: MRSA by PCR Next Gen: NOT DETECTED

## 2021-07-19 LAB — HIV ANTIBODY (ROUTINE TESTING W REFLEX): HIV Screen 4th Generation wRfx: NONREACTIVE

## 2021-07-19 MED ORDER — DOXYCYCLINE HYCLATE 100 MG PO TABS
100.0000 mg | ORAL_TABLET | Freq: Once | ORAL | Status: AC
  Administered 2021-07-19: 100 mg via ORAL
  Filled 2021-07-19: qty 1

## 2021-07-19 MED ORDER — METHYLPREDNISOLONE SODIUM SUCC 125 MG IJ SOLR
125.0000 mg | Freq: Every day | INTRAMUSCULAR | Status: DC
Start: 2021-07-19 — End: 2021-07-21
  Administered 2021-07-19 – 2021-07-21 (×3): 125 mg via INTRAVENOUS
  Filled 2021-07-19 (×3): qty 2

## 2021-07-19 MED ORDER — SODIUM CHLORIDE 0.9 % IV SOLN
2.0000 g | INTRAVENOUS | Status: AC
  Administered 2021-07-20 – 2021-07-24 (×5): 2 g via INTRAVENOUS
  Filled 2021-07-19 (×5): qty 20

## 2021-07-19 MED ORDER — ENOXAPARIN SODIUM 40 MG/0.4ML IJ SOSY
40.0000 mg | PREFILLED_SYRINGE | INTRAMUSCULAR | Status: DC
  Administered 2021-07-19 – 2021-07-24 (×6): 40 mg via SUBCUTANEOUS
  Filled 2021-07-19 (×6): qty 0.4

## 2021-07-19 MED ORDER — ACETAMINOPHEN 325 MG PO TABS: 650.0000 mg | ORAL_TABLET | Freq: Four times a day (QID) | ORAL | Status: DC | PRN

## 2021-07-19 MED ORDER — KETOROLAC TROMETHAMINE 30 MG/ML IJ SOLN
30.0000 mg | Freq: Once | INTRAMUSCULAR | Status: AC
  Administered 2021-07-19: 30 mg via INTRAVENOUS
  Filled 2021-07-19: qty 1

## 2021-07-19 MED ORDER — AZITHROMYCIN 250 MG PO TABS
500.0000 mg | ORAL_TABLET | ORAL | Status: AC
  Administered 2021-07-19 – 2021-07-23 (×5): 500 mg via ORAL
  Filled 2021-07-19 (×5): qty 2

## 2021-07-19 MED ORDER — GUAIFENESIN ER 600 MG PO TB12
600.0000 mg | ORAL_TABLET | Freq: Two times a day (BID) | ORAL | Status: DC
  Administered 2021-07-19 – 2021-07-25 (×12): 600 mg via ORAL
  Filled 2021-07-19 (×12): qty 1

## 2021-07-19 MED ORDER — IPRATROPIUM-ALBUTEROL 0.5-2.5 (3) MG/3ML IN SOLN
3.0000 mL | Freq: Four times a day (QID) | RESPIRATORY_TRACT | Status: DC
  Administered 2021-07-19 – 2021-07-21 (×8): 3 mL via RESPIRATORY_TRACT
  Filled 2021-07-19 (×7): qty 3

## 2021-07-19 MED ORDER — ACETAMINOPHEN 650 MG RE SUPP: 650.0000 mg | Freq: Four times a day (QID) | RECTAL | Status: DC | PRN

## 2021-07-19 MED ORDER — ONDANSETRON HCL 4 MG PO TABS: 4.0000 mg | ORAL_TABLET | Freq: Four times a day (QID) | ORAL | Status: DC | PRN

## 2021-07-19 MED ORDER — NICOTINE 21 MG/24HR TD PT24
21.0000 mg | MEDICATED_PATCH | Freq: Every day | TRANSDERMAL | Status: DC
  Administered 2021-07-19 – 2021-07-25 (×7): 21 mg via TRANSDERMAL
  Filled 2021-07-19 (×7): qty 1

## 2021-07-19 MED ORDER — CEFTRIAXONE SODIUM 1 G IJ SOLR
1.0000 g | Freq: Once | INTRAMUSCULAR | Status: AC
  Administered 2021-07-19: 1 g via INTRAVENOUS
  Filled 2021-07-19: qty 10

## 2021-07-19 MED ORDER — PREDNISONE 20 MG PO TABS: 40.0000 mg | ORAL_TABLET | Freq: Every day | ORAL | Status: DC

## 2021-07-19 MED ORDER — ONDANSETRON HCL 4 MG/2ML IJ SOLN: 4.0000 mg | Freq: Four times a day (QID) | INTRAMUSCULAR | Status: DC | PRN

## 2021-07-19 MED ORDER — IPRATROPIUM-ALBUTEROL 0.5-2.5 (3) MG/3ML IN SOLN
3.0000 mL | Freq: Once | RESPIRATORY_TRACT | Status: AC
  Administered 2021-07-19: 3 mL via RESPIRATORY_TRACT
  Filled 2021-07-19: qty 3

## 2021-07-19 NOTE — ED Triage Notes (Signed)
Pt bib ems for SOB and right sided Rib/back pain.  Pt states SOB x2 days, pt hx of COPD and collapsed lung. Pt states green,yellow,pink tinged sputum.

## 2021-07-19 NOTE — ED Provider Notes (Signed)
[facility] DEPT Provider Note   CSN: [id number] Arrival date & time: 07/19/21  1349     History {Add pertinent medical, surgical, social history, OB history to HPI:1} Chief Complaint  Patient presents with   Shortness of Breath    Brett Beck is a 46 y.o. male with a history of opioid dependence disorder, now 5 years sober, history of recurring spontaneous left-sided pneumothorax, history of smoking, presenting to ED with complaint of coughing and right pleuritic chest wall pain.  He reports he has had a upper respiratory infection and congestion and coughing for about 5 days.  He says he had a coughing fit last night while he was trying to sleep, and began to have sudden, sharp pain in his right lower chest wall.  He said it is worse with movement and with deep inspiration.  He has also coughed with dark sputum for several days.  Subjective fevers at home.  He does smoke at least a pack of cigarettes a day.  He denies use of alcohol or any other types of drugs.  He was strongly prefer to avoid any opioid medications for pain as he is sober.  HPI     Home Medications Prior to Admission medications   Medication Sig Start Date End Date Taking? Authorizing Provider  busPIRone (BUSPAR) 7.5 MG tablet Take 1 tablet (7.5 mg total) by mouth 2 (two) times daily. 02/15/16   Withrow, Elyse Jarvis, FNP  FLUoxetine (PROZAC) 40 MG capsule Take 1 capsule (40 mg total) by mouth daily. 02/16/16   Withrow, Elyse Jarvis, FNP  hydrOXYzine (ATARAX/VISTARIL) 25 MG tablet Take 1 tablet (25 mg total) by mouth every 6 (six) hours as needed for anxiety. 02/15/16   Withrow, Elyse Jarvis, FNP  lidocaine (LIDODERM) 5 % Place 1 patch onto the skin daily. Remove & Discard patch within 12 hours or as directed by MD 05/27/16   Long, Wonda Olds, MD  naproxen (NAPROSYN) 500 MG tablet Take 1 tablet (500 mg total) by mouth 2 (two) times daily with a meal. 05/27/16   Long, Wonda Olds, MD  nicotine polacrilex  (NICORETTE) 2 MG gum Take 1 each (2 mg total) by mouth as needed for smoking cessation. 02/15/16   Withrow, Elyse Jarvis, FNP  traZODone (DESYREL) 150 MG tablet Take 1 tablet (150 mg total) by mouth at bedtime. 02/15/16   Withrow, Elyse Jarvis, FNP      Allergies    Effexor [venlafaxine], Hydrocodone-acetaminophen, Ibuprofen, Lyrica [pregabalin], Neurontin [gabapentin], Nsaids, and Tramadol hcl    Review of Systems   Review of Systems  Physical Exam Updated Vital Signs BP 118/81 (BP Location: Right Arm)   Pulse (!) 108   Temp 98.3 F (36.8 C) (Oral)   Resp 20   Ht '6\' 1"'$  (1.854 m)   Wt 59 kg   SpO2 95%   BMI 17.15 kg/m  Physical Exam Constitutional:      General: He is not in acute distress.    Comments: Thin, frail habitus Coughing on exam  HENT:     Head: Normocephalic and atraumatic.  Eyes:     Conjunctiva/sclera: Conjunctivae normal.     Pupils: Pupils are equal, round, and reactive to light.  Cardiovascular:     Rate and Rhythm: Regular rhythm. Tachycardia present.  Pulmonary:     Effort: Pulmonary effort is normal. No respiratory distress.     Comments: Diminished breath sounds bilaterally, faint expiratory wheezing Chest:     Comments: Right lower lateral  chest wall tenderness along the rib line Abdominal:     General: There is no distension.     Tenderness: There is no abdominal tenderness.  Skin:    General: Skin is warm and dry.  Neurological:     General: No focal deficit present.     Mental Status: He is alert. Mental status is at baseline.  Psychiatric:        Mood and Affect: Mood normal.        Behavior: Behavior normal.    ED Results / Procedures / Treatments   Labs (all labs ordered are listed, but only abnormal results are displayed) Labs Reviewed - No data to display  EKG None  Radiology No results found.  Procedures Procedures  {Document cardiac monitor, telemetry assessment procedure when appropriate:1}  Medications Ordered in ED Medications  - No data to display  ED Course/ Medical Decision Making/ A&P                           Medical Decision Making Amount and/or Complexity of Data Reviewed Labs: ordered. Radiology: ordered. ECG/medicine tests: ordered.  Risk Prescription drug management.   This patient presents to the ED with concern for cough, congestion, chest wall pain. This involves an extensive number of treatment options, and is a complaint that carries with it a high risk of complications and morbidity.  The differential diagnosis includes viral URI versus pneumonia versus spontaneous pneumothorax versus rib fracture versus other  Co-morbidities that complicate the patient evaluation: History of spontaneous pneumothorax and smoking raises concern for emphysematous lung infection and recurring pneumothorax  Additional history obtained from paramedics and patient's arrival   I ordered and personally interpreted labs.  The pertinent results include:  ***  I ordered imaging studies including x-ray of the chest I independently visualized and interpreted imaging which showed *** I agree with the radiologist interpretation  The patient was maintained on a cardiac monitor.  I personally viewed and interpreted the cardiac monitored which showed an underlying rhythm of: Sinus tachycardia  Per my interpretation the patient's ECG shows ***  I ordered medication including ***  for *** I have reviewed the patients home medicines and have made adjustments as needed  Test Considered: ***  I requested consultation with the ***,  and discussed lab and imaging findings as well as pertinent plan - they recommend: ***  After the interventions noted above, I reevaluated the patient and found that they have: {resolved/improved/worsened:23923::"improved"}  Social Determinants of Health:***  Dispostion:  After consideration of the diagnostic results and the patients response to treatment, I feel that the patent would  benefit from ***.   {Document critical care time when appropriate:1} {Document review of labs and clinical decision tools ie heart score, Chads2Vasc2 etc:1}  {Document your independent review of radiology images, and any outside records:1} {Document your discussion with family members, caretakers, and with consultants:1} {Document social determinants of health affecting pt's care:1} {Document your decision making why or why not admission, treatments were needed:1} Final Clinical Impression(s) / ED Diagnoses Final diagnoses:  None    Rx / DC Orders ED Discharge Orders     None

## 2021-07-19 NOTE — H&P (Signed)
History and Physical    Patient: Brett Beck KYH:[id number] DOB: 1976-02-02 DOA: 07/19/2021 DOS: the patient was seen and examined on 07/19/2021 PCP: Pcp, No  Patient coming from: Home  Chief Complaint:  Chief Complaint  Patient presents with   Shortness of Breath   HPI: Brett Beck is a 46 y.o. male with medical history significant of COPD/Emphysema, depression. Presenting with productive cough and congestion. Symptoms started 6 days ago. He has not had fever, N/V/D, hemoptysis. He becomes short of breath with activity. He has no chest pain, but he has had right rib pain with cough. He has tried albuterol inhalers, APAP, and robitussin DM; but none of these have helped. When his symptoms did not improve this morning, he decided to come to the ED for help. He denies any other aggravating or alleviating factors.     Review of Systems: As mentioned in the history of present illness. All other systems reviewed and are negative. Past Medical History:  Diagnosis Date   Chronic back pain    Degenerative disc disease    Emphysema/COPD ([place])    Opiate abuse, continuous ([place])    Pneumothorax on left    Past Surgical History:  Procedure Laterality Date   lung inflation     left lung   LUNG SURGERY     Dr Arlyce Dice-- left lung   NOSE SURGERY     broken nose   Social History:  reports that he has been smoking cigarettes. He has a 18.00 pack-year smoking history. He has never used smokeless tobacco. He reports that he does not drink alcohol and does not use drugs.  Allergies  Allergen Reactions   Effexor [Venlafaxine] Diarrhea   Hydrocodone-Acetaminophen     REACTION: severe headaches   Ibuprofen Other (See Comments)    Severe stomach issues   Lyrica [Pregabalin] Diarrhea    Swelling in the feet   Neurontin [Gabapentin] Other (See Comments)    Severe stomach issues   Nsaids Other (See Comments)    Severe stomach issues   Tramadol Hcl     REACTION: diarrhea, abdominal pain     Family History  Problem Relation Age of Onset   Alpha-1 antitrypsin deficiency Mother    Emphysema Mother    Asthma Mother     Prior to Admission medications   Medication Sig Start Date End Date Taking? Authorizing Provider  busPIRone (BUSPAR) 7.5 MG tablet Take 1 tablet (7.5 mg total) by mouth 2 (two) times daily. 02/15/16   Withrow, Elyse Jarvis, FNP  FLUoxetine (PROZAC) 40 MG capsule Take 1 capsule (40 mg total) by mouth daily. 02/16/16   Withrow, Elyse Jarvis, FNP  hydrOXYzine (ATARAX/VISTARIL) 25 MG tablet Take 1 tablet (25 mg total) by mouth every 6 (six) hours as needed for anxiety. 02/15/16   Withrow, Elyse Jarvis, FNP  lidocaine (LIDODERM) 5 % Place 1 patch onto the skin daily. Remove & Discard patch within 12 hours or as directed by MD 05/27/16   Long, Wonda Olds, MD  naproxen (NAPROSYN) 500 MG tablet Take 1 tablet (500 mg total) by mouth 2 (two) times daily with a meal. 05/27/16   Long, Wonda Olds, MD  nicotine polacrilex (NICORETTE) 2 MG gum Take 1 each (2 mg total) by mouth as needed for smoking cessation. 02/15/16   Withrow, Elyse Jarvis, FNP  traZODone (DESYREL) 150 MG tablet Take 1 tablet (150 mg total) by mouth at bedtime. 02/15/16   [name], FNP    Physical Exam: Vitals:  07/19/21 1430 07/19/21 1445 07/19/21 1545 07/19/21 1553  BP: 102/83 110/87    Pulse: (!) 105 97 97 98  Resp: (!) 32 '19 16 18  '$ Temp:      TempSrc:      SpO2: 92% 94% 97% 98%  Weight:      Height:       General: 46 y.o. male resting in bed in NAD Eyes: PERRL, normal sclera ENMT: Nares patent w/o discharge, orophaynx clear, dentition normal, ears w/o discharge/lesions/ulcers; cachetic Neck: thin, trachea midline Cardiovascular: tachy, +S1, S2, no m/g/r, equal pulses throughout Respiratory: decreased air movement, scattered wheeze, rhonchi right mid/lower GI: BS+, NDNT, no masses noted, no organomegaly noted MSK: No e/c/c Neuro: A&O x 3, no focal deficits Psyc: Appropriate interaction and affect,  calm/cooperative  Data Reviewed:  Na+  135 K+ 3.8 Glucose  126 Scr  1.00 WBC  19.4  CT chest w/o Severe COPD with bullous emphysema. There is new alveolar and interstitial infiltrate in the anterior segment of right upper lobe consistent with pneumonia. There is no pleural effusion or pneumothorax.  EKG: sinus, no st elevations  Assessment and Plan: COPD/emphysema exacerbation RUL PNA     - admit to inpt, tele     - check urine legionella/strep     - COVID/flu negative     - duonebs, rocephin/zithro, steroids, guaifenesin     - needs PCP, will place TOC consult     - if symptoms don't improve, consider pulm consult  Tobacco abuse     - counseled against further use     - nicotine patch  Severe protein calorie malnutrition      - BMI 17.15; he is cachetic      - dietitian consult  Depression     - he is not taking meds  Advance Care Planning:   Code Status: FULL  Consults: None  Family Communication: None at bedside  Severity of Illness: The appropriate patient status for this patient is INPATIENT. Inpatient status is judged to be reasonable and necessary in order to provide the required intensity of service to ensure the patient's safety. The patient's presenting symptoms, physical exam findings, and initial radiographic and laboratory data in the context of their chronic comorbidities is felt to place them at high risk for further clinical deterioration. Furthermore, it is not anticipated that the patient will be medically stable for discharge from the hospital within 2 midnights of admission.   * I certify that at the point of admission it is my clinical judgment that the patient will require inpatient hospital care spanning beyond 2 midnights from the point of admission due to high intensity of service, high risk for further deterioration and high frequency of surveillance required.*  Author: Jonnie Finner, DO 07/19/2021 3:59 PM  For on call review  [email or web address].

## 2021-07-19 NOTE — ED Notes (Addendum)
Critical Lab Result  VBG Po2 <31  Reported to N.Alvino Chapel

## 2021-07-19 NOTE — ED Provider Notes (Signed)
  Physical Exam  BP 110/87   Pulse 97   Temp 98.3 F (36.8 C) (Oral)   Resp 19   Ht '6\' 1"'$  (1.854 m)   Wt 59 kg   SpO2 94%   BMI 17.15 kg/m   Physical Exam  Procedures  Procedures  ED Course / MDM   Clinical Course as of 07/19/21 1528  Fri Jul 19, 2021  1501 Pt signed out to Dr Alvino Chapel EDP pending f/u on CT imaging, IV antibiotics, labs - may need medical admission with smoking comorbidity [MT]    Clinical Course User Index [MT] Trifan, Carola Rhine, MD   Medical Decision Making Amount and/or Complexity of Data Reviewed Labs: ordered. Radiology: ordered. ECG/medicine tests: ordered.  Risk Prescription drug management.   Patient received in signout.  Shortness of breath.  Cough.  History of severe emphysema.  Lab work showed elevated white count.  Oxygenation is borderline with sats at rest of right about 90%.  Unsure of baseline however.  Severe emphysema on CT.  CT independently interpreted by me also.  Also shows right sided pneumonia.  I think with comorbidities of the emphysema borderline hypoxia patient will require admission to the hospital.  Has seen pulmonary remotely years ago but does not have a PCP now either.  Will discuss with hospitalist for admission.       Davonna Belling, MD 07/19/21 [phone]

## 2021-07-20 ENCOUNTER — Inpatient Hospital Stay (HOSPITAL_COMMUNITY): Payer: Self-pay

## 2021-07-20 DIAGNOSIS — Z720 Tobacco use: Secondary | ICD-10-CM

## 2021-07-20 LAB — CBC
HCT: 37.1 % — ABNORMAL LOW (ref 39.0–52.0)
Hemoglobin: 13.1 g/dL (ref 13.0–17.0)
MCH: 33.9 pg (ref 26.0–34.0)
MCHC: 35.3 g/dL (ref 30.0–36.0)
MCV: 95.9 fL (ref 80.0–100.0)
Platelets: 229 10*3/uL (ref 150–400)
RBC: 3.87 MIL/uL — ABNORMAL LOW (ref 4.22–5.81)
RDW: 12.9 % (ref 11.5–15.5)
WBC: 13.4 10*3/uL — ABNORMAL HIGH (ref 4.0–10.5)
nRBC: 0 % (ref 0.0–0.2)

## 2021-07-20 LAB — COMPREHENSIVE METABOLIC PANEL
ALT: 14 U/L (ref 0–44)
AST: 14 U/L — ABNORMAL LOW (ref 15–41)
Albumin: 3.1 g/dL — ABNORMAL LOW (ref 3.5–5.0)
Alkaline Phosphatase: 75 U/L (ref 38–126)
Anion gap: 10 (ref 5–15)
BUN: 17 mg/dL (ref 6–20)
CO2: 28 mmol/L (ref 22–32)
Calcium: 8.7 mg/dL — ABNORMAL LOW (ref 8.9–10.3)
Chloride: 94 mmol/L — ABNORMAL LOW (ref 98–111)
Creatinine, Ser: 0.98 mg/dL (ref 0.61–1.24)
GFR, Estimated: >60 mL/min (ref >60)
Glucose, Bld: 135 mg/dL — ABNORMAL HIGH (ref 70–99)
Potassium: 3.6 mmol/L (ref 3.5–5.1)
Sodium: 132 mmol/L — ABNORMAL LOW (ref 135–145)
Total Bilirubin: 0.7 mg/dL (ref 0.3–1.2)
Total Protein: 5.8 g/dL — ABNORMAL LOW (ref 6.5–8.1)

## 2021-07-20 LAB — EXPECTORATED SPUTUM ASSESSMENT W GRAM STAIN, RFLX TO RESP C

## 2021-07-20 LAB — PHOSPHORUS: Phosphorus: 3.5 mg/dL (ref 2.5–4.6)

## 2021-07-20 MED ORDER — DIPHENHYDRAMINE HCL 25 MG PO CAPS
25.0000 mg | ORAL_CAPSULE | Freq: Four times a day (QID) | ORAL | Status: DC | PRN
  Administered 2021-07-20: 25 mg via ORAL
  Filled 2021-07-20 (×2): qty 1

## 2021-07-20 MED ORDER — ADULT MULTIVITAMIN W/MINERALS CH
1.0000 | ORAL_TABLET | Freq: Every day | ORAL | Status: DC
  Administered 2021-07-20 – 2021-07-25 (×6): 1 via ORAL
  Filled 2021-07-20 (×6): qty 1

## 2021-07-20 MED ORDER — NYSTATIN 100000 UNIT/ML MT SUSP
5.0000 mL | Freq: Four times a day (QID) | OROMUCOSAL | Status: DC
  Administered 2021-07-20 – 2021-07-25 (×21): 500000 [IU] via ORAL
  Filled 2021-07-20 (×21): qty 5

## 2021-07-20 MED ORDER — BUDESONIDE 0.5 MG/2ML IN SUSP
0.5000 mg | Freq: Two times a day (BID) | RESPIRATORY_TRACT | Status: AC
  Administered 2021-07-20 – 2021-07-24 (×9): 0.5 mg via RESPIRATORY_TRACT
  Filled 2021-07-20 (×9): qty 2

## 2021-07-20 MED ORDER — ALPRAZOLAM 0.5 MG PO TABS
0.5000 mg | ORAL_TABLET | Freq: Three times a day (TID) | ORAL | Status: DC | PRN
  Administered 2021-07-20 – 2021-07-22 (×7): 0.5 mg via ORAL
  Filled 2021-07-20 (×7): qty 1

## 2021-07-20 MED ORDER — ENSURE ENLIVE PO LIQD
237.0000 mL | Freq: Three times a day (TID) | ORAL | Status: DC
  Administered 2021-07-20 (×2): 237 mL via ORAL

## 2021-07-20 NOTE — Progress Notes (Addendum)
Initial Nutrition Assessment  DOCUMENTATION CODES:   Underweight (Suspect Malnutrition present, RD to attempt to gain more information on follow-up)  INTERVENTION:   Ensure Enlive po TID, each supplement provides 350 kcal and 20 grams of protein.  MVI with Minerals daily  Encourage small, frequent meals  NUTRITION DIAGNOSIS:   Increased nutrient needs related to acute illness, chronic illness, catabolic illness as evidenced by estimated needs.  GOAL:   Patient will meet greater than or equal to 90% of their needs   MONITOR:   PO intake, Supplement acceptance, Labs, Weight trends  REASON FOR ASSESSMENT:   Malnutrition Screening Tool, Consult Assessment of nutrition requirement/status  ASSESSMENT:   46 yo male admitted with COPD exacerbation. PMH includes COPD with bullous emphysema, tobacco abuse  5/26 CT chest with severe COPD with bullous emphysema, pneumonia  Unable to reach pt via phone; unable to obtain complete nutrition history at this time  Pt currently requiring 2L [place]  Currently on Regular diet; no recorded po intake. Per RN, pt appetite is good. Ate about 50% of breakfast, 100% of lunch today  Current wt 59 kg; no recent weight encounters. Noted weight around 64 kg in 2018  Labs: sodium 132 (L) Meds: solumedrol   NUTRITION - FOCUSED PHYSICAL EXAM:  Unable to assess  Diet Order:   Diet Order             Diet regular Room service appropriate? Yes; Fluid consistency: Thin  Diet effective now                   EDUCATION NEEDS:   Not appropriate for education at this time  Skin:  Skin Assessment: Reviewed RN Assessment  Last BM:  5/26  Height:   Ht Readings from Last 1 Encounters:  07/19/21 '6\' 1"'$  (1.854 m)    Weight:   Wt Readings from Last 1 Encounters:  07/19/21 59 kg     BMI:  Body mass index is 17.15 kg/m.  Estimated Nutritional Needs:   Kcal:  2100-2300 kcals  Protein:  110-130 g  Fluid:  >/= 2L   Kerman Passey  MS, RDN, LDN, CNSC Registered Dietitian III Clinical Nutrition RD Pager and On-Call Pager Number Located in [place]

## 2021-07-20 NOTE — Progress Notes (Signed)
[facility] PROGRESS NOTE   ZAI CHMIEL KDT:[id number] DOB: 02/04/1976 DOA: 07/19/2021  PCP: Pcp, No  Brief History/Interval Summary: 46 y.o. male with medical history significant of COPD/Emphysema, depression.  Presented with cough shortness of breath.  Found to have pneumonia and COPD exacerbation.  Was hospitalized for further management.    Consultants: None  Procedures: None    Subjective/Interval History: Complains of feeling short of breath this morning after he tried to get out of the bed to go to the bathroom.  Denies any chest pain.  Mentions that the last time he had to be hospitalized for breathing difficulties was about 20 years ago.  Continues to smoke about 1 pack of cigarettes on a daily basis.  No nausea or vomiting.   Assessment/Plan:  Community-acquired pneumonia/COPD with acute exacerbation Significant emphysema noted on CT chest.  Right-sided infiltrate noted on the CT. Patient started on ceftriaxone and azithromycin.  Follow-up on cultures. Does not have a primary care provider.  Does not see a pulmonologist.  Does not appear that he is on any maintenance inhaler treatment at home. Patient started on nebulizer treatment and steroids.  Will add nebulized budesonide as well.  Plan to transition to [facility] prior to discharge. Ambulatory referral will be sent to pulmonology at discharge. Currently requiring 2 L of oxygen by nasal cannula.  Hopefully will be weaned off of oxygen by discharge. WBC is better.  Headaches Patient reports occasional headaches with sharp pains at times.  We will proceed with CT head.  HIV was nonreactive.  Hyponatremia Recheck labs tomorrow.  Tobacco abuse Counseled.  Nicotine patch.  DVT Prophylaxis: Lovenox Code Status: Full code Family Communication: Discussed with patient Disposition Plan: Hopefully return home in improved  Status is: Inpatient Remains inpatient appropriate because: COPD with acute exacerbation,  community-acquired pneumonia      Medications: Scheduled:  azithromycin  500 mg Oral Q24H   enoxaparin (LOVENOX) injection  40 mg Subcutaneous Q24H   guaiFENesin  600 mg Oral BID   ipratropium-albuterol  3 mL Nebulization Q6H   methylPREDNISolone (SOLU-MEDROL) injection  125 mg Intravenous Daily   nicotine  21 mg Transdermal Daily   nystatin  5 mL Oral QID   predniSONE  40 mg Oral Q breakfast   Continuous:  cefTRIAXone (ROCEPHIN)  IV     DXI:[id number] **OR** acetaminophen, diphenhydrAMINE, ondansetron **OR** ondansetron (ZOFRAN) IV  Antibiotics: Anti-infectives (From admission, onward)    Start     Dose/Rate Route Frequency Ordered Stop   07/20/21 1500  cefTRIAXone (ROCEPHIN) 2 g in sodium chloride 0.9 % 100 mL IVPB        2 g 200 mL/hr over 30 Minutes Intravenous Every 24 hours 07/19/21 1701 07/25/21 1459   07/19/21 1800  azithromycin (ZITHROMAX) tablet 500 mg        500 mg Oral Every 24 hours 07/19/21 1701 07/24/21 1759   07/19/21 1445  cefTRIAXone (ROCEPHIN) 1 g in sodium chloride 0.9 % 100 mL IVPB        1 g 200 mL/hr over 30 Minutes Intravenous  Once 07/19/21 1434 07/19/21 1523   07/19/21 1445  doxycycline (VIBRA-TABS) tablet 100 mg        100 mg Oral  Once 07/19/21 1434 07/19/21 1454       Objective:  Vital Signs  Vitals:   07/19/21 2024 07/20/21 0103 07/20/21 0437 07/20/21 0828  BP: 105/80 100/79 108/72   Pulse: 97 90 99   Resp: 17 18    Temp:  98 F (36.7 C) 97.9 F (36.6 C) 97.8 F (36.6 C)   TempSrc: Oral Oral Oral   SpO2: 97% 98% 94% 95%  Weight:      Height:        Intake/Output Summary (Last 24 hours) at 07/20/2021 0952 Last data filed at 07/19/2021 1700 Gross per 24 hour  Intake 100.12 ml  Output --  Net 100.12 ml   Filed Weights   07/19/21 1358  Weight: 59 kg    General appearance: Awake alert.  In no distress Resp: Tachypneic.  No use of accessory muscles.  Diminished air entry at the bases with few crackles in the right.  No  wheezing appreciated. Cardio: S1-S2 is normal regular.  No S3-S4.  No rubs murmurs or bruit GI: Abdomen is soft.  Nontender nondistended.  Bowel sounds are present normal.  No masses organomegaly Extremities: No edema.  Full range of motion of lower extremities. Neurologic: Alert and oriented x3.  No focal neurological deficits.    Lab Results:  Data Reviewed: I have personally reviewed following labs and reports of the imaging studies  CBC: Recent Labs  Lab 07/19/21 1417 07/19/21 1720 07/20/21 0546  WBC 19.4* 20.1* 13.4*  NEUTROABS 18.0*  --   --   HGB 15.9 14.6 13.1  HCT 45.8 42.4 37.1*  MCV 96.6 97.5 95.9  PLT 261 258 195    Basic Metabolic Panel: Recent Labs  Lab 07/19/21 1417 07/19/21 1720 07/20/21 0546  NA 135  --  132*  K 3.8  --  3.6  CL 97*  --  94*  CO2 29  --  28  GLUCOSE 126*  --  135*  BUN 9  --  17  CREATININE 1.00 1.05 0.98  CALCIUM 9.1  --  8.7*  PHOS  --   --  3.5    GFR: Estimated Creatinine Clearance: 76.9 mL/min (by C-G formula based on SCr of 0.98 mg/dL).  Liver Function Tests: Recent Labs  Lab 07/19/21 1417 07/20/21 0546  AST 18 14*  ALT 16 14  ALKPHOS 100 75  BILITOT 1.1 0.7  PROT 7.3 5.8*  ALBUMIN 3.9 3.1*     Recent Results (from the past 240 hour(s))  SARS Coronavirus 2 by RT PCR (hospital order, performed in [facility] lab) *cepheid single result test*     Status: None   Collection Time: 07/19/21  2:18 PM   Specimen: Nasal Swab  Result Value Ref Range Status   SARS Coronavirus 2 by RT PCR NEGATIVE NEGATIVE Final    Comment: (NOTE) SARS-CoV-2 target nucleic acids are NOT DETECTED.  The SARS-CoV-2 RNA is generally detectable in upper and lower respiratory specimens during the acute phase of infection. The lowest concentration of SARS-CoV-2 viral copies this assay can detect is 250 copies / mL. A negative result does not preclude SARS-CoV-2 infection and should not be used as the sole basis for treatment or  other patient management decisions.  A negative result may occur with improper specimen collection / handling, submission of specimen other than nasopharyngeal swab, presence of viral mutation(s) within the areas targeted by this assay, and inadequate number of viral copies (<250 copies / mL). A negative result must be combined with clinical observations, patient history, and epidemiological information.  Fact Sheet for Patients:   https://www.patel.info/  Fact Sheet for Healthcare Providers: https://hall.com/  This test is not yet approved or  cleared by the [place] FDA and has been authorized for detection and/or diagnosis of  SARS-CoV-2 by FDA under an Emergency Use Authorization (EUA).  This EUA will remain in effect (meaning this test can be used) for the duration of the COVID-19 declaration under Section 564(b)(1) of the Act, 21 U.S.C. section 360bbb-3(b)(1), unless the authorization is terminated or revoked sooner.  Performed at [facility], [place] [address], [place], [place] [address]   Blood culture (routine single)     Status: None (Preliminary result)   Collection Time: 07/19/21  2:40 PM   Specimen: BLOOD  Result Value Ref Range Status   Specimen Description   Final    BLOOD RIGHT ANTECUBITAL Performed at [facility] [address], [place], [place] [address]    Special Requests   Final    BOTTLES DRAWN AEROBIC AND ANAEROBIC Blood Culture adequate volume Performed at [facility] [address] [place], [place] [address]    Culture   Final    NO GROWTH < 24 HOURS Performed at [facility] [address], [place], [place] [address]    Report Status PENDING  Incomplete  MRSA Next Gen by PCR, Nasal     Status: None   Collection Time: 07/19/21  5:02 PM   Specimen: Nasal Mucosa; Nasal Swab  Result Value Ref Range Status   MRSA by PCR Next Gen NOT  DETECTED NOT DETECTED Final    Comment: (NOTE) The GeneXpert MRSA Assay (FDA approved for NASAL specimens only), is one component of a comprehensive MRSA colonization surveillance program. It is not intended to diagnose MRSA infection nor to guide or monitor treatment for MRSA infections. Test performance is not FDA approved in patients less than 93 years old. Performed at [facility], [place] [address], [place],  [address]       Radiology Studies: CT Chest Wo Contrast  Result Date: 07/19/2021 CLINICAL DATA:  Shortness of breath, right chest pain, pneumonia EXAM: CT CHEST WITHOUT CONTRAST TECHNIQUE: Multidetector CT imaging of the chest was performed following the standard protocol without IV contrast. RADIATION DOSE REDUCTION: This exam was performed according to the departmental dose-optimization program which includes automated exposure control, adjustment of the mA and/or kV according to patient size and/or use of iterative reconstruction technique. COMPARISON:  Previous studies including chest radiograph done earlier today and CT done on 05/27/2016 FINDINGS: Cardiovascular: There is ectasia of main pulmonary artery measuring 3.6 cm suggesting pulmonary arterial hypertension. Mediastinum/Nodes: No significant lymphadenopathy seen. Lungs/Pleura: Severe bullous emphysema is seen in both lungs especially in the right upper lung fields and left lower lung fields. There is new patchy alveolar infiltrate in the anterior segment of right upper lobe. There is subtle increase in interstitial markings in the lateral aspect of right mid lung fields. Rest of the lung fields show no new infiltrates. There is no pleural effusion or pneumothorax. Upper Abdomen: Unremarkable. Musculoskeletal: Unremarkable. IMPRESSION: Severe COPD with bullous emphysema. There is new alveolar and interstitial infiltrate in the anterior segment of right upper lobe consistent with pneumonia. There is no  pleural effusion or pneumothorax. Electronically Signed   By: Elmer Picker M.D.   On: 07/19/2021 15:20   DG Chest Portable 1 View  Result Date: 07/19/2021 CLINICAL DATA:  Cough, shortness of breath EXAM: PORTABLE CHEST 1 VIEW COMPARISON:  Previous studies including the examination of 05/26/2016 FINDINGS: Cardiac size is within normal limits. Low position of diaphragms suggests COPD. Bullous emphysema is seen in both lungs, more so in the right upper and left lower lung  fields. Linear densities in the right parahilar region have not changed significantly. There are no signs of alveolar pulmonary edema or new focal consolidation. There is interval increase in interstitial markings in the right lower lung fields. There is no pleural effusion or pneumothorax. IMPRESSION: Severe COPD with bullous emphysema. There is no focal pulmonary consolidation. There is slight increase in interstitial markings in the right lower lung fields which may be due to crowding of bronchovascular structures or interstitial pneumonia. Electronically Signed   By: Elmer Picker M.D.   On: 07/19/2021 14:33       LOS: 1 day   [facility] Pager on [email or web address]  07/20/2021, 9:52 AM

## 2021-07-21 DIAGNOSIS — E871 Hypo-osmolality and hyponatremia: Secondary | ICD-10-CM

## 2021-07-21 LAB — CBC
HCT: 37.1 % — ABNORMAL LOW (ref 39.0–52.0)
Hemoglobin: 12.5 g/dL — ABNORMAL LOW (ref 13.0–17.0)
MCH: 32.7 pg (ref 26.0–34.0)
MCHC: 33.7 g/dL (ref 30.0–36.0)
MCV: 97.1 fL (ref 80.0–100.0)
Platelets: 262 10*3/uL (ref 150–400)
RBC: 3.82 MIL/uL — ABNORMAL LOW (ref 4.22–5.81)
RDW: 12.9 % (ref 11.5–15.5)
WBC: 15.3 10*3/uL — ABNORMAL HIGH (ref 4.0–10.5)
nRBC: 0 % (ref 0.0–0.2)

## 2021-07-21 LAB — SODIUM, URINE, RANDOM: Sodium, Ur: <10 mmol/L

## 2021-07-21 LAB — BASIC METABOLIC PANEL
Anion gap: 7 (ref 5–15)
BUN: 18 mg/dL (ref 6–20)
CO2: 30 mmol/L (ref 22–32)
Calcium: 8.5 mg/dL — ABNORMAL LOW (ref 8.9–10.3)
Chloride: 94 mmol/L — ABNORMAL LOW (ref 98–111)
Creatinine, Ser: 0.93 mg/dL (ref 0.61–1.24)
GFR, Estimated: >60 mL/min (ref >60)
Glucose, Bld: 107 mg/dL — ABNORMAL HIGH (ref 70–99)
Potassium: 4 mmol/L (ref 3.5–5.1)
Sodium: 131 mmol/L — ABNORMAL LOW (ref 135–145)

## 2021-07-21 LAB — RAPID URINE DRUG SCREEN, HOSP PERFORMED
Amphetamines: NOT DETECTED
Barbiturates: NOT DETECTED
Benzodiazepines: POSITIVE — AB
Cocaine: NOT DETECTED
Opiates: NOT DETECTED
Tetrahydrocannabinol: NOT DETECTED

## 2021-07-21 LAB — OSMOLALITY, URINE: Osmolality, Ur: 348 mOsm/kg (ref 300–900)

## 2021-07-21 MED ORDER — IPRATROPIUM BROMIDE 0.02 % IN SOLN
0.5000 mg | Freq: Four times a day (QID) | RESPIRATORY_TRACT | Status: DC
Start: 2021-07-21 — End: 2021-07-23
  Administered 2021-07-21 – 2021-07-23 (×6): 0.5 mg via RESPIRATORY_TRACT
  Filled 2021-07-21 (×6): qty 2.5

## 2021-07-21 MED ORDER — LEVALBUTEROL HCL 0.63 MG/3ML IN NEBU
0.6300 mg | INHALATION_SOLUTION | Freq: Four times a day (QID) | RESPIRATORY_TRACT | Status: DC
  Administered 2021-07-21 – 2021-07-23 (×6): 0.63 mg via RESPIRATORY_TRACT
  Filled 2021-07-21 (×6): qty 3

## 2021-07-21 MED ORDER — LORAZEPAM 2 MG/ML IJ SOLN
0.5000 mg | Freq: Once | INTRAMUSCULAR | Status: AC
  Administered 2021-07-21: 0.5 mg via INTRAVENOUS
  Filled 2021-07-21: qty 1

## 2021-07-21 MED ORDER — PREDNISONE 20 MG PO TABS
60.0000 mg | ORAL_TABLET | Freq: Every day | ORAL | Status: DC
  Administered 2021-07-22 – 2021-07-23 (×2): 60 mg via ORAL
  Filled 2021-07-21 (×2): qty 3

## 2021-07-21 MED ORDER — SODIUM CHLORIDE 0.9 % IV SOLN: INTRAVENOUS | Status: DC

## 2021-07-21 MED ORDER — SALINE SPRAY 0.65 % NA SOLN
1.0000 | NASAL | Status: DC | PRN
  Filled 2021-07-21 (×2): qty 44

## 2021-07-21 NOTE — Progress Notes (Addendum)
[facility] PROGRESS NOTE   ZYAIR RUSSI [id number]:[id number] DOB: 04/26/1975 DOA: 07/19/2021  PCP: Pcp, No  Brief History/Interval Summary: 46 y.o. male with medical history significant of COPD/Emphysema, depression.  Presented with cough shortness of breath.  Found to have pneumonia and COPD exacerbation.  Was hospitalized for further management.    Consultants: None  Procedures: None    Subjective/Interval History: Mentions that he is slowly improving but still feels short of breath with minimal exertion.  Denies any chest pain.  No nausea vomiting.     Assessment/Plan:  Community-acquired pneumonia/COPD with acute exacerbation Significant emphysema noted on CT chest.  Right-sided infiltrate noted on the CT. Patient started on ceftriaxone and azithromycin.  Follow-up on cultures. Does not have a primary care provider.  Does not see a pulmonologist.  Does not appear that he is on any maintenance inhaler treatment at home. Patient started on nebulizer treatment and steroids.  Budesonide nebulizer treatment was added.  Continues to have shortness of breath.  Some wheezing heard this morning. Plan will be to transition to [facility] prior to discharge. Continue current treatment for now.  Rising WBC is due to steroids. Ambulatory referral will be sent to pulmonology at discharge. Currently requiring 2 L of oxygen by nasal cannula.  Hopefully will be weaned off of oxygen by discharge. Follow-up on sputum cultures.  Headaches Patient reports occasional headaches with sharp pains at times.  CT head was unremarkable.  HIV nonreactive.  Outpatient management.  Hyponatremia Sodium level remains low.  Check urine osmolality.  Trial of IV fluids.  Check TSH and cortisol.  Tobacco abuse Counseled.  Nicotine patch.  DVT Prophylaxis: Lovenox Code Status: Full code Family Communication: Discussed with patient Disposition Plan: Hopefully return home when improved  Status is:  Inpatient Remains inpatient appropriate because: COPD with acute exacerbation, community-acquired pneumonia      Medications: Scheduled:  azithromycin  500 mg Oral Q24H   budesonide (PULMICORT) nebulizer solution  0.5 mg Nebulization BID   enoxaparin (LOVENOX) injection  40 mg Subcutaneous Q24H   feeding supplement  237 mL Oral TID BM   guaiFENesin  600 mg Oral BID   ipratropium-albuterol  3 mL Nebulization Q6H   methylPREDNISolone (SOLU-MEDROL) injection  125 mg Intravenous Daily   multivitamin with minerals  1 tablet Oral Daily   nicotine  21 mg Transdermal Daily   nystatin  5 mL Oral QID   Continuous:  cefTRIAXone (ROCEPHIN)  IV 2 g (07/20/21 1409)   KGY:[id number] **OR** acetaminophen, ALPRAZolam, diphenhydrAMINE, ondansetron **OR** ondansetron (ZOFRAN) IV  Antibiotics: Anti-infectives (From admission, onward)    Start     Dose/Rate Route Frequency Ordered Stop   07/20/21 1500  cefTRIAXone (ROCEPHIN) 2 g in sodium chloride 0.9 % 100 mL IVPB        2 g 200 mL/hr over 30 Minutes Intravenous Every 24 hours 07/19/21 1701 07/25/21 1459   07/19/21 1800  azithromycin (ZITHROMAX) tablet 500 mg        500 mg Oral Every 24 hours 07/19/21 1701 07/24/21 1759   07/19/21 1445  cefTRIAXone (ROCEPHIN) 1 g in sodium chloride 0.9 % 100 mL IVPB        1 g 200 mL/hr over 30 Minutes Intravenous  Once 07/19/21 1434 07/19/21 1523   07/19/21 1445  doxycycline (VIBRA-TABS) tablet 100 mg        100 mg Oral  Once 07/19/21 1434 07/19/21 1454       Objective:  Vital Signs  Vitals:  07/20/21 2102 07/21/21 0153 07/21/21 0454 07/21/21 0734  BP: (!) 118/91  101/68   Pulse: (!) 109  (!) 108   Resp: 18  18   Temp: 97.7 F (36.5 C)  97.6 F (36.4 C)   TempSrc: Oral  Oral   SpO2: 99% 100% 94% 94%  Weight:      Height:        Intake/Output Summary (Last 24 hours) at 07/21/2021 0913 Last data filed at 07/21/2021 0906 Gross per 24 hour  Intake 100 ml  Output 800 ml  Net -700 ml     Filed Weights   07/19/21 1358  Weight: 59 kg    General appearance: Awake alert.  In no distress Resp:  tachypneic at rest.  No use of accessory muscles.  Today wheezing is appreciated bilateral lungs especially at the bases.  Few crackles.  No rhonchi. Cardio: S1-S2 is normal regular.  No S3-S4.  No rubs murmurs or bruit GI: Abdomen is soft.  Nontender nondistended.  Bowel sounds are present normal.  No masses organomegaly Extremities: No edema.  Full range of motion of lower extremities. Neurologic: Alert and oriented x3.  No focal neurological deficits.     Lab Results:  Data Reviewed: I have personally reviewed following labs and reports of the imaging studies  CBC: Recent Labs  Lab 07/19/21 1417 07/19/21 1720 07/20/21 0546 07/21/21 0553  WBC 19.4* 20.1* 13.4* 15.3*  NEUTROABS 18.0*  --   --   --   HGB 15.9 14.6 13.1 12.5*  HCT 45.8 42.4 37.1* 37.1*  MCV 96.6 97.5 95.9 97.1  PLT 261 258 229 262     Basic Metabolic Panel: Recent Labs  Lab 07/19/21 1417 07/19/21 1720 07/20/21 0546 07/21/21 0553  NA 135  --  132* 131*  K 3.8  --  3.6 4.0  CL 97*  --  94* 94*  CO2 29  --  28 30  GLUCOSE 126*  --  135* 107*  BUN 9  --  17 18  CREATININE 1.00 1.05 0.98 0.93  CALCIUM 9.1  --  8.7* 8.5*  PHOS  --   --  3.5  --      GFR: Estimated Creatinine Clearance: 81.1 mL/min (by C-G formula based on SCr of 0.93 mg/dL).  Liver Function Tests: Recent Labs  Lab 07/19/21 1417 07/20/21 0546  AST 18 14*  ALT 16 14  ALKPHOS 100 75  BILITOT 1.1 0.7  PROT 7.3 5.8*  ALBUMIN 3.9 3.1*      Recent Results (from the past 240 hour(s))  SARS Coronavirus 2 by RT PCR (hospital order, performed in [facility] hospital lab) *cepheid single result test*     Status: None   Collection Time: 07/19/21  2:18 PM   Specimen: Nasal Swab  Result Value Ref Range Status   SARS Coronavirus 2 by RT PCR NEGATIVE NEGATIVE Final    Comment: (NOTE) SARS-CoV-2 target nucleic acids are  NOT DETECTED.  The SARS-CoV-2 RNA is generally detectable in upper and lower respiratory specimens during the acute phase of infection. The lowest concentration of SARS-CoV-2 viral copies this assay can detect is 250 copies / mL. A negative result does not preclude SARS-CoV-2 infection and should not be used as the sole basis for treatment or other patient management decisions.  A negative result may occur with improper specimen collection / handling, submission of specimen other than nasopharyngeal swab, presence of viral mutation(s) within the areas targeted by this assay, and inadequate number  of viral copies (<250 copies / mL). A negative result must be combined with clinical observations, patient history, and epidemiological information.  Fact Sheet for Patients:   https://www.patel.info/  Fact Sheet for Healthcare Providers: https://hall.com/  This test is not yet approved or  cleared by the [place] FDA and has been authorized for detection and/or diagnosis of SARS-CoV-2 by FDA under an Emergency Use Authorization (EUA).  This EUA will remain in effect (meaning this test can be used) for the duration of the COVID-19 declaration under Section 564(b)(1) of the Act, 21 U.S.C. section 360bbb-3(b)(1), unless the authorization is terminated or revoked sooner.  Performed at [facility], [place] [address] [place], [place] [address]   Blood culture (routine single)     Status: None (Preliminary result)   Collection Time: 07/19/21  2:40 PM   Specimen: BLOOD  Result Value Ref Range Status   Specimen Description   Final    BLOOD RIGHT ANTECUBITAL Performed at [place] [address], [place], [place] [address]    Special Requests   Final    BOTTLES DRAWN AEROBIC AND ANAEROBIC Blood Culture adequate volume Performed at [place] [address], [place], [place]  [address]    Culture   Final    NO GROWTH < 24 HOURS Performed at [place] [address] [place], [place] [address]    Report Status PENDING  Incomplete  MRSA Next Gen by PCR, Nasal     Status: None   Collection Time: 07/19/21  5:02 PM   Specimen: Nasal Mucosa; Nasal Swab  Result Value Ref Range Status   MRSA by PCR Next Gen NOT DETECTED NOT DETECTED Final    Comment: (NOTE) The GeneXpert MRSA Assay (FDA approved for NASAL specimens only), is one component of a comprehensive MRSA colonization surveillance program. It is not intended to diagnose MRSA infection nor to guide or monitor treatment for MRSA infections. Test performance is not FDA approved in patients less than 74 years old. Performed at [facility], [place] [address] [place], [place] [address]   Expectorated Sputum Assessment w Gram Stain, Rflx to Resp Cult     Status: None   Collection Time: 07/20/21 10:51 AM   Specimen: Sputum  Result Value Ref Range Status   Specimen Description SPUTUM  Final   Special Requests NONE  Final   Sputum evaluation   Final    THIS SPECIMEN IS ACCEPTABLE FOR SPUTUM CULTURE Performed at [facility], [place] [address], [place], [place] [address]    Report Status 07/20/2021 FINAL  Final  Culture, Respiratory w Gram Stain     Status: None (Preliminary result)   Collection Time: 07/20/21 10:51 AM   Specimen: SPU  Result Value Ref Range Status   Specimen Description   Final    SPUTUM Performed at [place] [address] [place], [place] [address]    Special Requests   Final    NONE Reflexed from [id number] Performed at [facility], [place] [address] [place], [place] [address]    Gram Stain   Final    MODERATE WBC PRESENT,BOTH PMN AND MONONUCLEAR RARE GRAM POSITIVE RODS RARE YEAST    Culture   Final    CULTURE REINCUBATED FOR BETTER GROWTH Performed at [facility], [place] [address],  [place], [place] [address]    Report Status PENDING  Incomplete  Radiology Studies: CT HEAD WO CONTRAST (5MM)  Result Date: 07/20/2021 CLINICAL DATA:  Headache, chronic, with new features or increased frequency. EXAM: CT HEAD WITHOUT CONTRAST TECHNIQUE: Contiguous axial images were obtained from the base of the skull through the vertex without intravenous contrast. RADIATION DOSE REDUCTION: This exam was performed according to the departmental dose-optimization program which includes automated exposure control, adjustment of the mA and/or kV according to patient size and/or use of iterative reconstruction technique. COMPARISON:  None Available. FINDINGS: Brain: No evidence of acute infarction, hemorrhage, hydrocephalus, extra-axial collection or mass lesion/mass effect. Vascular: No hyperdense vessel or unexpected calcification. Skull: Normal. Negative for fracture or focal lesion. Sinuses/Orbits: No acute finding. IMPRESSION: Negative head CT. Electronically Signed   By: Jorje Guild M.D.   On: 07/20/2021 10:30   CT Chest Wo Contrast  Result Date: 07/19/2021 CLINICAL DATA:  Shortness of breath, right chest pain, pneumonia EXAM: CT CHEST WITHOUT CONTRAST TECHNIQUE: Multidetector CT imaging of the chest was performed following the standard protocol without IV contrast. RADIATION DOSE REDUCTION: This exam was performed according to the departmental dose-optimization program which includes automated exposure control, adjustment of the mA and/or kV according to patient size and/or use of iterative reconstruction technique. COMPARISON:  Previous studies including chest radiograph done earlier today and CT done on 05/27/2016 FINDINGS: Cardiovascular: There is ectasia of main pulmonary artery measuring 3.6 cm suggesting pulmonary arterial hypertension. Mediastinum/Nodes: No significant lymphadenopathy seen. Lungs/Pleura: Severe bullous emphysema is seen in both lungs especially in the right upper lung  fields and left lower lung fields. There is new patchy alveolar infiltrate in the anterior segment of right upper lobe. There is subtle increase in interstitial markings in the lateral aspect of right mid lung fields. Rest of the lung fields show no new infiltrates. There is no pleural effusion or pneumothorax. Upper Abdomen: Unremarkable. Musculoskeletal: Unremarkable. IMPRESSION: Severe COPD with bullous emphysema. There is new alveolar and interstitial infiltrate in the anterior segment of right upper lobe consistent with pneumonia. There is no pleural effusion or pneumothorax. Electronically Signed   By: Elmer Picker M.D.   On: 07/19/2021 15:20   DG Chest Portable 1 View  Result Date: 07/19/2021 CLINICAL DATA:  Cough, shortness of breath EXAM: PORTABLE CHEST 1 VIEW COMPARISON:  Previous studies including the examination of 05/26/2016 FINDINGS: Cardiac size is within normal limits. Low position of diaphragms suggests COPD. Bullous emphysema is seen in both lungs, more so in the right upper and left lower lung fields. Linear densities in the right parahilar region have not changed significantly. There are no signs of alveolar pulmonary edema or new focal consolidation. There is interval increase in interstitial markings in the right lower lung fields. There is no pleural effusion or pneumothorax. IMPRESSION: Severe COPD with bullous emphysema. There is no focal pulmonary consolidation. There is slight increase in interstitial markings in the right lower lung fields which may be due to crowding of bronchovascular structures or interstitial pneumonia. Electronically Signed   By: Elmer Picker M.D.   On: 07/19/2021 14:33       LOS: 2 days   [facility] Pager on [email or web address]  07/21/2021, 9:13 AM

## 2021-07-22 ENCOUNTER — Inpatient Hospital Stay (HOSPITAL_COMMUNITY): Payer: Self-pay

## 2021-07-22 DIAGNOSIS — R0609 Other forms of dyspnea: Secondary | ICD-10-CM

## 2021-07-22 LAB — BASIC METABOLIC PANEL
Anion gap: 6 (ref 5–15)
BUN: 12 mg/dL (ref 6–20)
CO2: 32 mmol/L (ref 22–32)
Calcium: 8.8 mg/dL — ABNORMAL LOW (ref 8.9–10.3)
Chloride: 100 mmol/L (ref 98–111)
Creatinine, Ser: 0.83 mg/dL (ref 0.61–1.24)
GFR, Estimated: >60 mL/min (ref >60)
Glucose, Bld: 95 mg/dL (ref 70–99)
Potassium: 4.7 mmol/L (ref 3.5–5.1)
Sodium: 138 mmol/L (ref 135–145)

## 2021-07-22 LAB — CORTISOL: Cortisol, Plasma: 2.2 ug/dL

## 2021-07-22 LAB — CBC
HCT: 38.8 % — ABNORMAL LOW (ref 39.0–52.0)
Hemoglobin: 13.3 g/dL (ref 13.0–17.0)
MCH: 34.1 pg — ABNORMAL HIGH (ref 26.0–34.0)
MCHC: 34.3 g/dL (ref 30.0–36.0)
MCV: 99.5 fL (ref 80.0–100.0)
Platelets: 271 10*3/uL (ref 150–400)
RBC: 3.9 MIL/uL — ABNORMAL LOW (ref 4.22–5.81)
RDW: 13.2 % (ref 11.5–15.5)
WBC: 11.6 10*3/uL — ABNORMAL HIGH (ref 4.0–10.5)
nRBC: 0 % (ref 0.0–0.2)

## 2021-07-22 LAB — ECHOCARDIOGRAM COMPLETE
Height: 73 in
S' Lateral: 2.4 cm
Weight: 2080 oz

## 2021-07-22 LAB — TSH: TSH: 0.349 u[IU]/mL — ABNORMAL LOW (ref 0.350–4.500)

## 2021-07-22 MED ORDER — ESCITALOPRAM OXALATE 10 MG PO TABS
10.0000 mg | ORAL_TABLET | Freq: Every day | ORAL | Status: DC
  Administered 2021-07-22 – 2021-07-25 (×4): 10 mg via ORAL
  Filled 2021-07-22 (×4): qty 1

## 2021-07-22 MED ORDER — LOPERAMIDE HCL 2 MG PO CAPS
4.0000 mg | ORAL_CAPSULE | Freq: Three times a day (TID) | ORAL | Status: DC | PRN
  Administered 2021-07-22 – 2021-07-25 (×6): 4 mg via ORAL
  Filled 2021-07-22 (×6): qty 2

## 2021-07-22 MED ORDER — GUAIFENESIN-DM 100-10 MG/5ML PO SYRP
5.0000 mL | ORAL_SOLUTION | ORAL | Status: DC | PRN
  Administered 2021-07-22: 5 mL via ORAL
  Filled 2021-07-22: qty 10

## 2021-07-22 MED ORDER — CLONAZEPAM 0.5 MG PO TABS
0.5000 mg | ORAL_TABLET | Freq: Three times a day (TID) | ORAL | Status: DC | PRN
  Administered 2021-07-22 – 2021-07-25 (×8): 0.5 mg via ORAL
  Filled 2021-07-22 (×8): qty 1

## 2021-07-22 MED ORDER — LORAZEPAM 2 MG/ML IJ SOLN
0.5000 mg | Freq: Once | INTRAMUSCULAR | Status: AC
  Administered 2021-07-22: 0.5 mg via INTRAVENOUS
  Filled 2021-07-22: qty 1

## 2021-07-22 MED ORDER — FUROSEMIDE 40 MG PO TABS
40.0000 mg | ORAL_TABLET | Freq: Once | ORAL | Status: AC
Start: 2021-07-22 — End: 2021-07-22
  Administered 2021-07-22: 40 mg via ORAL
  Filled 2021-07-22: qty 1

## 2021-07-22 MED ORDER — COSYNTROPIN 0.25 MG IJ SOLR
0.2500 mg | Freq: Once | INTRAMUSCULAR | Status: AC
  Administered 2021-07-23: 0.25 mg via INTRAVENOUS
  Filled 2021-07-22: qty 0.25

## 2021-07-22 NOTE — Plan of Care (Signed)
°  Problem: Respiratory: °Goal: Ability to maintain a clear airway will improve °Outcome: Progressing °  °

## 2021-07-22 NOTE — Progress Notes (Signed)
[facility] PROGRESS NOTE   Brett Beck IDP:[id number] DOB: [birth year] DOA: 07/19/2021  PCP: Pcp, No  Brief History/Interval Summary: 46 y.o. male with medical history significant of COPD/Emphysema, depression.  Presented with cough shortness of breath.  Found to have pneumonia and COPD exacerbation.  Was hospitalized for further management.    Consultants: None  Procedures: None    Subjective/Interval History: Patient mentioned that he is feeling anxious.  Also complains of continued cough with yellowish expectoration.  He did have a couple of times when he had some blood streaks in his sputum.  Denies any chest pain.  Swelling in his foot is stable.     Assessment/Plan:  Community-acquired pneumonia/COPD with acute exacerbation Significant emphysema noted on CT chest.  Right-sided infiltrate noted on the CT. Patient started on ceftriaxone and azithromycin.  Blood cultures are negative.  Strep pneumo urinary antigen was negative.  HIV negative.  Sputum cultures pending.  Rare gram-positive rods noted on Gram stain.   Does not have a primary care provider.  Does not see a pulmonologist.  Does not appear that he is on any maintenance inhaler treatment at home. Patient started on nebulizer treatment and steroids.  Budesonide nebulizer treatment was added.  Plan will be to transition to [facility] prior to discharge. Due to tachycardia he was changed over from albuterol to Xopenex.  He was also feeling quite jittery so we stopped his Solu-Medrol and switched him over to oral prednisone. Due to complaints of blood-tinged sputum's chest x-ray to be repeated today.   Continues to require oxygen via nasal cannula.   If patient does not make much progress in the next 24 hours then may have to involve pulmonology. WBC was elevated at presentation and then improved.  He is on steroids as well.  Edema of both feet No significant edema in the legs were noted.  No calf tenderness.   Echocardiogram shows normal systolic function.  However elevated pulmonary artery pressures were noted.  Abdomen was noted to be 3.1.  There could be some nutritional reason for his edema.  Continue to monitor for now.  Stop IV fluids.  Generalized anxiety disorder Persistently anxious.  He was on Xanax as needed.  We will change him to Klonopin.  We will add Lexapro.  Apparently his sister passed away recently which has increased his anxiety levels.  His dyspnea is also contributing.  Headaches Patient reports occasional headaches with sharp pains at times.  CT head was unremarkable.    Hyponatremia Sodium level noted to be better today.  Urine osmolality 348, urine sodium less than 10.  He was given IV fluids yesterday. TSH 0.34.  We will check free T4.  Cortisol 2.2.  We will do a ACTH stimulation test tomorrow.    Tobacco abuse Counseled.  Nicotine patch.  DVT Prophylaxis: Lovenox Code Status: Full code Family Communication: Discussed with patient Disposition Plan: Hopefully return home when improved.  Mobilize as tolerated.  Status is: Inpatient Remains inpatient appropriate because: COPD with acute exacerbation, community-acquired pneumonia      Medications: Scheduled:  azithromycin  500 mg Oral Q24H   budesonide (PULMICORT) nebulizer solution  0.5 mg Nebulization BID   enoxaparin (LOVENOX) injection  40 mg Subcutaneous Q24H   escitalopram  10 mg Oral Daily   feeding supplement  237 mL Oral TID BM   guaiFENesin  600 mg Oral BID   ipratropium  0.5 mg Nebulization Q6H   levalbuterol  0.63 mg Nebulization Q6H  multivitamin with minerals  1 tablet Oral Daily   nicotine  21 mg Transdermal Daily   nystatin  5 mL Oral QID   predniSONE  60 mg Oral Q breakfast   Continuous:  cefTRIAXone (ROCEPHIN)  IV 2 g (07/21/21 1402)   VQM:[id number] **OR** acetaminophen, clonazePAM, diphenhydrAMINE, guaiFENesin-dextromethorphan, ondansetron **OR** ondansetron (ZOFRAN) IV, sodium  chloride  Antibiotics: Anti-infectives (From admission, onward)    Start     Dose/Rate Route Frequency Ordered Stop   07/20/21 1500  cefTRIAXone (ROCEPHIN) 2 g in sodium chloride 0.9 % 100 mL IVPB        2 g 200 mL/hr over 30 Minutes Intravenous Every 24 hours 07/19/21 1701 07/25/21 1459   07/19/21 1800  azithromycin (ZITHROMAX) tablet 500 mg        500 mg Oral Every 24 hours 07/19/21 1701 07/24/21 1759   07/19/21 1445  cefTRIAXone (ROCEPHIN) 1 g in sodium chloride 0.9 % 100 mL IVPB        1 g 200 mL/hr over 30 Minutes Intravenous  Once 07/19/21 1434 07/19/21 1523   07/19/21 1445  doxycycline (VIBRA-TABS) tablet 100 mg        100 mg Oral  Once 07/19/21 1434 07/19/21 1454       Objective:  Vital Signs  Vitals:   07/21/21 2049 07/22/21 0227 07/22/21 0450 07/22/21 0744  BP: (!) 139/91  118/85   Pulse: (!) 108  (!) 102   Resp: 18  18   Temp: 97.7 F (36.5 C)  97.6 F (36.4 C)   TempSrc: Oral  Oral   SpO2: 98% 98% 96% 94%  Weight:      Height:        Intake/Output Summary (Last 24 hours) at 07/22/2021 0953 Last data filed at 07/21/2021 1545 Gross per 24 hour  Intake 643.5 ml  Output 300 ml  Net 343.5 ml    Filed Weights   07/19/21 1358  Weight: 59 kg    General appearance: Awake alert.  In no distress Resp: Tachypneic.  No use of accessory muscles.  Scattered wheezing bilaterally with few crackles at the bases.  No rhonchi. Cardio: S1-S2 is normal regular.  No S3-S4.  No rubs murmurs or bruit GI: Abdomen is soft.  Nontender nondistended.  Bowel sounds are present normal.  No masses organomegaly Extremities: No edema.  Full range of motion of lower extremities. Neurologic: Alert and oriented x3.  No focal neurological deficits.     Lab Results:  Data Reviewed: I have personally reviewed following labs and reports of the imaging studies  CBC: Recent Labs  Lab 07/19/21 1417 07/19/21 1720 07/20/21 0546 07/21/21 0553 07/22/21 0507  WBC 19.4* 20.1* 13.4*  15.3* 11.6*  NEUTROABS 18.0*  --   --   --   --   HGB 15.9 14.6 13.1 12.5* 13.3  HCT 45.8 42.4 37.1* 37.1* 38.8*  MCV 96.6 97.5 95.9 97.1 99.5  PLT 261 258 229 262 271     Basic Metabolic Panel: Recent Labs  Lab 07/19/21 1417 07/19/21 1720 07/20/21 0546 07/21/21 0553 07/22/21 0507  NA 135  --  132* 131* 138  K 3.8  --  3.6 4.0 4.7  CL 97*  --  94* 94* 100  CO2 29  --  28 30 32  GLUCOSE 126*  --  135* 107* 95  BUN 9  --  '17 18 12  '$ CREATININE 1.00 1.05 0.98 0.93 0.83  CALCIUM 9.1  --  8.7* 8.5* 8.8*  PHOS  --   --  3.5  --   --      GFR: Estimated Creatinine Clearance: 90.8 mL/min (by C-G formula based on SCr of 0.83 mg/dL).  Liver Function Tests: Recent Labs  Lab 07/19/21 1417 07/20/21 0546  AST 18 14*  ALT 16 14  ALKPHOS 100 75  BILITOT 1.1 0.7  PROT 7.3 5.8*  ALBUMIN 3.9 3.1*      Recent Results (from the past 240 hour(s))  SARS Coronavirus 2 by RT PCR (hospital order, performed in [facility] hospital lab) *cepheid single result test*     Status: None   Collection Time: 07/19/21  2:18 PM   Specimen: Nasal Swab  Result Value Ref Range Status   SARS Coronavirus 2 by RT PCR NEGATIVE NEGATIVE Final    Comment: (NOTE) SARS-CoV-2 target nucleic acids are NOT DETECTED.  The SARS-CoV-2 RNA is generally detectable in upper and lower respiratory specimens during the acute phase of infection. The lowest concentration of SARS-CoV-2 viral copies this assay can detect is 250 copies / mL. A negative result does not preclude SARS-CoV-2 infection and should not be used as the sole basis for treatment or other patient management decisions.  A negative result may occur with improper specimen collection / handling, submission of specimen other than nasopharyngeal swab, presence of viral mutation(s) within the areas targeted by this assay, and inadequate number of viral copies (<250 copies / mL). A negative result must be combined with clinical observations, patient  history, and epidemiological information.  Fact Sheet for Patients:   https://www.patel.info/  Fact Sheet for Healthcare Providers: https://hall.com/  This test is not yet approved or  cleared by the [place] FDA and has been authorized for detection and/or diagnosis of SARS-CoV-2 by FDA under an Emergency Use Authorization (EUA).  This EUA will remain in effect (meaning this test can be used) for the duration of the COVID-19 declaration under Section 564(b)(1) of the Act, 21 U.S.C. section 360bbb-3(b)(1), unless the authorization is terminated or revoked sooner.  Performed at [facility], [place] [address], [place], [place] [address]   Blood culture (routine single)     Status: None (Preliminary result)   Collection Time: 07/19/21  2:40 PM   Specimen: BLOOD  Result Value Ref Range Status   Specimen Description   Final    BLOOD RIGHT ANTECUBITAL Performed at [facility] [address], [place], [place] [address]    Special Requests   Final    BOTTLES DRAWN AEROBIC AND ANAEROBIC Blood Culture adequate volume Performed at [facility] [address] [place], [place] [address]    Culture   Final    NO GROWTH 3 DAYS Performed at [facility], [place] [address], [place], [place] [address]    Report Status PENDING  Incomplete  MRSA Next Gen by PCR, Nasal     Status: None   Collection Time: 07/19/21  5:02 PM   Specimen: Nasal Mucosa; Nasal Swab  Result Value Ref Range Status   MRSA by PCR Next Gen NOT DETECTED NOT DETECTED Final    Comment: (NOTE) The GeneXpert MRSA Assay (FDA approved for NASAL specimens only), is one component of a comprehensive MRSA colonization surveillance program. It is not intended to diagnose MRSA infection nor to guide or monitor treatment for MRSA infections. Test performance is not FDA approved in patients less than 87 years old. Performed at  [facility], [place] [address], [place], [place] [address]   Expectorated Sputum  Assessment w Gram Stain, Rflx to Resp Cult     Status: None   Collection Time: 07/20/21 10:51 AM   Specimen: Sputum  Result Value Ref Range Status   Specimen Description SPUTUM  Final   Special Requests NONE  Final   Sputum evaluation   Final    THIS SPECIMEN IS ACCEPTABLE FOR SPUTUM CULTURE Performed at [facility], [place] [address], [place], [place] [address]    Report Status 07/20/2021 FINAL  Final  Culture, Respiratory w Gram Stain     Status: None (Preliminary result)   Collection Time: 07/20/21 10:51 AM   Specimen: SPU  Result Value Ref Range Status   Specimen Description   Final    SPUTUM Performed at [facility] [address] [place], [place] [address]    Special Requests   Final    NONE Reflexed from [id number] Performed at [facility], [place] [address] [place], [place] [address]    Gram Stain   Final    MODERATE WBC PRESENT,BOTH PMN AND MONONUCLEAR RARE GRAM POSITIVE RODS RARE YEAST    Culture   Final    CULTURE REINCUBATED FOR BETTER GROWTH Performed at [facility], [place] [address], [place], [place] [address]    Report Status PENDING  Incomplete       Radiology Studies: CT HEAD WO CONTRAST (5MM)  Result Date: 07/20/2021 CLINICAL DATA:  Headache, chronic, with new features or increased frequency. EXAM: CT HEAD WITHOUT CONTRAST TECHNIQUE: Contiguous axial images were obtained from the base of the skull through the vertex without intravenous contrast. RADIATION DOSE REDUCTION: This exam was performed according to the departmental dose-optimization program which includes automated exposure control, adjustment of the mA and/or kV according to patient size and/or use of iterative reconstruction technique. COMPARISON:  None Available. FINDINGS: Brain: No evidence of acute infarction, hemorrhage, hydrocephalus,  extra-axial collection or mass lesion/mass effect. Vascular: No hyperdense vessel or unexpected calcification. Skull: Normal. Negative for fracture or focal lesion. Sinuses/Orbits: No acute finding. IMPRESSION: Negative head CT. Electronically Signed   By: Jorje Guild M.D.   On: 07/20/2021 10:30   ECHOCARDIOGRAM COMPLETE  Result Date: 07/22/2021    ECHOCARDIOGRAM REPORT   Patient Name:   Brett Beck Date of Exam: 07/22/2021 Medical Rec #:  [id number]      Height:       73.0 in Accession #:    [id number]     Weight:       130.0 lb Date of Birth:  [birth year]      BSA:          1.792 m Patient Age:    52 years       BP:           118/85 mmHg Patient Gender: M              HR:           111 bpm. Exam Location:  Inpatient Procedure: 2D Echo, Cardiac Doppler and Color Doppler Indications:    Dyspnea R06.00  History:        Patient has no prior history of Echocardiogram examinations.                 Risk Factors:Current Smoker.  Sonographer:    Darlina Sicilian RDCS Referring Phys: [id number]  Sonographer Comments: Image acquisition challenging due to COPD. IMPRESSIONS  1. Left ventricular ejection fraction, by estimation, is 55 to 60%. The left  ventricle has normal function. The left ventricle has no regional wall motion abnormalities. Left ventricular diastolic parameters were normal.  2. Right ventricular systolic function is normal. The right ventricular size is normal. There is severely elevated pulmonary artery systolic pressure.  3. The mitral valve is normal in structure. Trivial mitral valve regurgitation. No evidence of mitral stenosis.  4. The aortic valve is tricuspid. Aortic valve regurgitation is not visualized. No aortic stenosis is present.  5. The inferior vena cava is dilated in size with >50% respiratory variability, suggesting right atrial pressure of 8 mmHg. Comparison(s): No prior Echocardiogram. FINDINGS  Left Ventricle: Left ventricular ejection fraction, by estimation, is 55 to 60%.  The left ventricle has normal function. The left ventricle has no regional wall motion abnormalities. The left ventricular internal cavity size was normal in size. There is  no left ventricular hypertrophy. Left ventricular diastolic parameters were normal. Right Ventricle: The right ventricular size is normal. Right ventricular systolic function is normal. There is severely elevated pulmonary artery systolic pressure. The tricuspid regurgitant velocity is 3.69 m/s, and with an assumed right atrial pressure  of 8 mmHg, the estimated right ventricular systolic pressure is 16.6 mmHg. Left Atrium: Left atrial size was normal in size. Right Atrium: Right atrial size was normal in size. Pericardium: Trivial pericardial effusion is present. Mitral Valve: The mitral valve is normal in structure. Trivial mitral valve regurgitation. No evidence of mitral valve stenosis. Tricuspid Valve: The tricuspid valve is normal in structure. Tricuspid valve regurgitation is mild . No evidence of tricuspid stenosis. Aortic Valve: The aortic valve is tricuspid. Aortic valve regurgitation is not visualized. No aortic stenosis is present. Pulmonic Valve: The pulmonic valve was grossly normal. Pulmonic valve regurgitation is not visualized. No evidence of pulmonic stenosis. Aorta: The aortic root is normal in size and structure. Venous: The inferior vena cava is dilated in size with greater than 50% respiratory variability, suggesting right atrial pressure of 8 mmHg. IAS/Shunts: No atrial level shunt detected by color flow Doppler.  LEFT VENTRICLE PLAX 2D LVIDd:         4.30 cm LVIDs:         2.40 cm LV PW:         0.70 cm LV IVS:        0.60 cm LVOT diam:     2.20 cm LV SV:         52 LV SV Index:   29 LVOT Area:     3.80 cm  RIGHT VENTRICLE RV S prime:     16.70 cm/s TAPSE (M-mode): 2.3 cm LEFT ATRIUM             Index LA diam:        3.40 cm 1.90 cm/m LA Vol (A2C):   26.6 ml 14.85 ml/m LA Vol (A4C):   33.2 ml 18.53 ml/m LA Biplane  Vol: 30.7 ml 17.13 ml/m  AORTIC VALVE LVOT Vmax:   80.00 cm/s LVOT Vmean:  44.700 cm/s LVOT VTI:    0.137 m  AORTA Ao Root diam: 3.40 cm Ao Asc diam:  2.80 cm TRICUSPID VALVE TR Peak grad:   54.5 mmHg TR Vmax:        369.00 cm/s  SHUNTS Systemic VTI:  0.14 m Systemic Diam: 2.20 cm Kirk Ruths MD Electronically signed by Kirk Ruths MD Signature Date/Time: 07/22/2021/8:31:47 AM    Final        LOS: 3 days   [facility] Pager on  [email or web address]  07/22/2021, 9:53 AM

## 2021-07-22 NOTE — Progress Notes (Signed)
  Echocardiogram 2D Echocardiogram has been performed.  Brett Beck M 07/22/2021, 8:24 AM

## 2021-07-23 ENCOUNTER — Inpatient Hospital Stay (HOSPITAL_COMMUNITY): Payer: Self-pay

## 2021-07-23 DIAGNOSIS — J441 Chronic obstructive pulmonary disease with (acute) exacerbation: Secondary | ICD-10-CM

## 2021-07-23 LAB — ACTH STIMULATION, 3 TIME POINTS
Cortisol, 30 Min: 15.7 ug/dL
Cortisol, 60 Min: 25.6 ug/dL
Cortisol, Base: 2.2 ug/dL

## 2021-07-23 LAB — CULTURE, RESPIRATORY W GRAM STAIN: Culture: NORMAL

## 2021-07-23 LAB — LEGIONELLA PNEUMOPHILA SEROGP 1 UR AG: L. pneumophila Serogp 1 Ur Ag: NEGATIVE

## 2021-07-23 LAB — T4, FREE: Free T4: 1.12 ng/dL (ref 0.61–1.12)

## 2021-07-23 MED ORDER — IPRATROPIUM BROMIDE 0.02 % IN SOLN
0.5000 mg | Freq: Three times a day (TID) | RESPIRATORY_TRACT | Status: DC
  Administered 2021-07-23 – 2021-07-24 (×4): 0.5 mg via RESPIRATORY_TRACT
  Filled 2021-07-23 (×4): qty 2.5

## 2021-07-23 MED ORDER — SODIUM CHLORIDE (PF) 0.9 % IJ SOLN
INTRAMUSCULAR | Status: AC
  Filled 2021-07-23: qty 50

## 2021-07-23 MED ORDER — PREDNISONE 20 MG PO TABS
40.0000 mg | ORAL_TABLET | Freq: Every day | ORAL | Status: DC
Start: 2021-07-24 — End: 2021-07-25
  Administered 2021-07-24 – 2021-07-25 (×2): 40 mg via ORAL
  Filled 2021-07-23 (×2): qty 2

## 2021-07-23 MED ORDER — ARFORMOTEROL TARTRATE 15 MCG/2ML IN NEBU
15.0000 ug | INHALATION_SOLUTION | Freq: Two times a day (BID) | RESPIRATORY_TRACT | Status: AC
  Administered 2021-07-23 – 2021-07-24 (×3): 15 ug via RESPIRATORY_TRACT
  Filled 2021-07-23 (×4): qty 2

## 2021-07-23 MED ORDER — LEVALBUTEROL HCL 0.63 MG/3ML IN NEBU
0.6300 mg | INHALATION_SOLUTION | Freq: Three times a day (TID) | RESPIRATORY_TRACT | Status: DC
  Administered 2021-07-23 – 2021-07-24 (×4): 0.63 mg via RESPIRATORY_TRACT
  Filled 2021-07-23 (×4): qty 3

## 2021-07-23 MED ORDER — FUROSEMIDE 10 MG/ML IJ SOLN
40.0000 mg | Freq: Once | INTRAMUSCULAR | Status: AC
  Administered 2021-07-23: 40 mg via INTRAVENOUS
  Filled 2021-07-23: qty 4

## 2021-07-23 MED ORDER — POTASSIUM CHLORIDE CRYS ER 20 MEQ PO TBCR
40.0000 meq | EXTENDED_RELEASE_TABLET | Freq: Once | ORAL | Status: AC
  Administered 2021-07-23: 40 meq via ORAL
  Filled 2021-07-23: qty 2

## 2021-07-23 MED ORDER — IOHEXOL 350 MG/ML SOLN
75.0000 mL | Freq: Once | INTRAVENOUS | Status: AC | PRN
  Administered 2021-07-23: 75 mL via INTRAVENOUS

## 2021-07-23 MED ORDER — LEVALBUTEROL HCL 0.63 MG/3ML IN NEBU: 0.6300 mg | INHALATION_SOLUTION | Freq: Four times a day (QID) | RESPIRATORY_TRACT | Status: DC | PRN

## 2021-07-23 NOTE — Consult Note (Addendum)
NAME:  Brett Beck, MRN:  [id number], DOB:  1975-12-25, LOS: 4 ADMISSION DATE:  07/19/2021, CONSULTATION DATE:  07/23/21 REFERRING MD:  [place] Pink, CHIEF COMPLAINT:  cough and dyspnea   History of Present Illness:  46yM with history of COPD/emphysema seen in past by Dr. Gwenette Greet A1AT MZ, PTX in 1998 and 2000 s/p VATS in 2000, opioid use who was admitted 07/19/21 after presentin gto [facility] ED with acute productive cough, dyspnea, R pleuritic CP. VBG on admission showed compensated chronic hypercapnia. He was started on bronchodilators, steroids, CAP coverage. Over last day or so has developed some hemoptysis. He has remained on 2L O2 during admission with good oxygen saturation. With slow improvement on 5/30 primary team ordered CTA Chest and consulted pulmonary.   He has no family history of lung cancer, no personal history of VTE   Pertinent  Medical History  COPD Severe bullous emphysema  Significant Hospital Events: Including procedures, antibiotic start and stop dates in addition to other pertinent events   07/19/21 admitted and started on ceftriaxone, azithro, steroids, bronchodilators  Interim History / Subjective:    Objective   Blood pressure (!) 117/93, pulse 87, temperature 98 F (36.7 C), temperature source Oral, resp. rate 16, height '6\' 1"'$  (1.854 m), weight 59 kg, SpO2 91 %.        Intake/Output Summary (Last 24 hours) at 07/23/2021 1038 Last data filed at 07/23/2021 0542 Gross per 24 hour  Intake 340 ml  Output 1500 ml  Net -1160 ml   Filed Weights   07/19/21 1358  Weight: 59 kg    Examination: General appearance: 46 y.o., male, NAD, conversant, very thin bordering cachectic Eyes: anicteric sclerae; PERRL, tracking appropriately HENT: NCAT; MMM Neck: Trachea midline; no lymphadenopathy, no JVD Lungs: faint wheeze bl, no crackles, no wheeze, with normal respiratory effort CV: RRR, no murmur  Abdomen: Soft, non-tender; non-distended, BS present  Extremities: 1+ edema  over ankles, warm Neuro: Jittery, alert and oriented to person and place, no focal deficit    S Cr stable WBCs trending down  Micro unrevealing  CT Chest 07/19/21 with RUL anterior segment consolidation, stable severe bullous emphysema  TTE with severely elevated RVSP 62.5  Resolved Hospital Problem list     Assessment & Plan:   # RUL CAP # Acute exacerbation of COPD # Severe bullous emphysema, PiMZ # Chronic hypercapnic respiratory failure # Severely elevated RVSP - would continue ceftriaxone, has completed course of azithromycin - would decrease to prednisone 40 mg daily tomorrow, likely taper off over another week - would add brovana, continue pulmicort, scheduled atrovent - would encourage flutter valve after each atrovent treatment - would challenge again with diuretic today. Remainder of pulmonary hypertension workup can be completed on outpatient basis if still symptomatic despite optimizing his COPD regimen. - wean nasal cannula for saturation 88-92% in setting of his chronic hypercapnia. may need walking oximetry before discharge - will follow up on CTA chest. May need surveillance ct chest in 6-8 weeks for RUL consolidation pending CTA today. - will continue to encourage smoking cessation  We will continue to follow  Best Practice (right click and "Reselect all SmartList Selections" daily)   Per primary  Labs   CBC: Recent Labs  Lab 07/19/21 1417 07/19/21 1720 07/20/21 0546 07/21/21 0553 07/22/21 0507  WBC 19.4* 20.1* 13.4* 15.3* 11.6*  NEUTROABS 18.0*  --   --   --   --   HGB 15.9 14.6 13.1 12.5* 13.3  HCT 45.8 42.4  37.1* 37.1* 38.8*  MCV 96.6 97.5 95.9 97.1 99.5  PLT 261 258 229 262 400    Basic Metabolic Panel: Recent Labs  Lab 07/19/21 1417 07/19/21 1720 07/20/21 0546 07/21/21 0553 07/22/21 0507  NA 135  --  132* 131* 138  K 3.8  --  3.6 4.0 4.7  CL 97*  --  94* 94* 100  CO2 29  --  28 30 32  GLUCOSE 126*  --  135* 107* 95  BUN 9  --   '17 18 12  '$ CREATININE 1.00 1.05 0.98 0.93 0.83  CALCIUM 9.1  --  8.7* 8.5* 8.8*  PHOS  --   --  3.5  --   --    GFR: Estimated Creatinine Clearance: 90.8 mL/min (by C-G formula based on SCr of 0.83 mg/dL). Recent Labs  Lab 07/19/21 1440 07/19/21 1610 07/19/21 1720 07/20/21 0546 07/21/21 0553 07/22/21 0507  WBC  --   --  20.1* 13.4* 15.3* 11.6*  LATICACIDVEN 1.4 1.3  --   --   --   --     Liver Function Tests: Recent Labs  Lab 07/19/21 1417 07/20/21 0546  AST 18 14*  ALT 16 14  ALKPHOS 100 75  BILITOT 1.1 0.7  PROT 7.3 5.8*  ALBUMIN 3.9 3.1*   No results for input(s): LIPASE, AMYLASE in the last 168 hours. No results for input(s): AMMONIA in the last 168 hours.  ABG    Component Value Date/Time   HCO3 35.0 (H) 07/19/2021 1440   O2SAT 24.9 07/19/2021 1440     Coagulation Profile: No results for input(s): INR, PROTIME in the last 168 hours.  Cardiac Enzymes: No results for input(s): CKTOTAL, CKMB, CKMBINDEX, TROPONINI in the last 168 hours.  HbA1C: No results found for: HGBA1C  CBG: No results for input(s): GLUCAP in the last 168 hours.  Review of Systems:   12 point review of systems is negative except as in HPI  Past Medical History:  He,  has a past medical history of Chronic back pain, Degenerative disc disease, Emphysema/COPD ([place]), Opiate abuse, continuous ([place]), and Pneumothorax on left.   Surgical History:   Past Surgical History:  Procedure Laterality Date   lung inflation     left lung   LUNG SURGERY     Dr Arlyce Dice-- left lung   NOSE SURGERY     broken nose     Social History:   reports that he has been smoking cigarettes. He has a 18.00 pack-year smoking history. He has never used smokeless tobacco. He reports that he does not drink alcohol and does not use drugs.   Family History:  His family history includes Alpha-1 antitrypsin deficiency in his mother; Asthma in his mother; Emphysema in his mother.   Allergies Allergies  Allergen  Reactions   Effexor [Venlafaxine] Diarrhea   Hydrocodone-Acetaminophen     REACTION: severe headaches   Ibuprofen Other (See Comments)    Severe stomach issues   Lyrica [Pregabalin] Diarrhea    Swelling in the feet   Neurontin [Gabapentin] Other (See Comments)    Severe stomach issues   Nsaids Other (See Comments)    Severe stomach issues   Tramadol Hcl     REACTION: diarrhea, abdominal pain     Home Medications  Prior to Admission medications   Medication Sig Start Date End Date Taking? Authorizing Provider  acetaminophen (TYLENOL) 500 MG tablet Take 500 mg by mouth every 6 (six) hours as needed for moderate pain.  Yes [provider]  albuterol (VENTOLIN HFA) 108 (90 Base) MCG/ACT inhaler Inhale 2 puffs into the lungs every 6 (six) hours as needed for wheezing or shortness of breath.   Yes [provider]  busPIRone (BUSPAR) 7.5 MG tablet Take 1 tablet (7.5 mg total) by mouth 2 (two) times daily. Patient not taking: Reported on 07/19/2021 02/15/16   [name], FNP  FLUoxetine (PROZAC) 40 MG capsule Take 1 capsule (40 mg total) by mouth daily. Patient not taking: Reported on 07/19/2021 02/16/16   [name], FNP  hydrOXYzine (ATARAX/VISTARIL) 25 MG tablet Take 1 tablet (25 mg total) by mouth every 6 (six) hours as needed for anxiety. Patient not taking: Reported on 07/19/2021 02/15/16   [name], FNP  lidocaine (LIDODERM) 5 % Place 1 patch onto the skin daily. Remove & Discard patch within 12 hours or as directed by MD Patient not taking: Reported on 07/19/2021 05/27/16   Long, Wonda Olds, MD  naproxen (NAPROSYN) 500 MG tablet Take 1 tablet (500 mg total) by mouth 2 (two) times daily with a meal. Patient not taking: Reported on 07/19/2021 05/27/16   Long, Wonda Olds, MD  nicotine polacrilex (NICORETTE) 2 MG gum Take 1 each (2 mg total) by mouth as needed for smoking cessation. Patient not taking: Reported on 07/19/2021 02/15/16   [name], FNP   traZODone (DESYREL) 150 MG tablet Take 1 tablet (150 mg total) by mouth at bedtime. Patient not taking: Reported on 07/19/2021 02/15/16   [name], FNP     Critical care time: n/a

## 2021-07-23 NOTE — Progress Notes (Addendum)
[facility] PROGRESS NOTE   Brett Beck CXK:[id number] DOB: 1975-04-11 DOA: 07/19/2021  PCP: Pcp, No  Brief History/Interval Summary: 46 y.o. male with medical history significant of COPD/Emphysema, depression.  Presented with cough shortness of breath.  Found to have pneumonia and COPD exacerbation.  Was hospitalized for further management.    Consultants: Pulmonology  Procedures: None    Subjective/Interval History: Patient mentions that he does not feel much better.  Continues to have wheezing.  Continues to get short of breath with minimal exertion.  According to nursing staff he tends to do better as the day progresses.  Again has noticed some bloody streaks in the sputum.  Chest pain has improved.     Assessment/Plan:  Community-acquired pneumonia/COPD with acute exacerbation Significant emphysema noted on CT chest.  Right-sided infiltrate noted on the CT. Patient started on ceftriaxone and azithromycin.  Blood cultures are negative.  Strep pneumo urinary antigen was negative.  HIV negative.   Sputum cultures pending.  Rare gram-positive rods noted on Gram stain.   Does not have a primary care provider.  Does not see a pulmonologist.  Does not appear that he is on any maintenance inhaler treatment at home. Patient started on nebulizer treatment and steroids.  Budesonide nebulizer treatment was added.  Plan will be to transition to [facility] prior to discharge. Due to tachycardia he was changed over from albuterol to Xopenex.  He was also feeling quite jittery so we stopped his Solu-Medrol and switched him over to oral prednisone. Patient went in mild hemoptysis yesterday.  Chest x-ray was repeated which did not show any acute findings except for a significant emphysema which was seen previously as well. Patient continues to have significant symptoms.  Due to his mild hemoptysis we will go ahead and rule out pulmonary embolism by doing a CT angiogram. Since patient is  still not improving much we will consult pulmonology to assist with management.  Edema of both feet No significant edema in the legs were noted.  No calf tenderness.  Echocardiogram shows normal systolic function.  However elevated pulmonary artery pressures were noted.  Abdomen was noted to be 3.1.  There could be some nutritional reason for his edema.   IV fluids were discontinued.  He was given a dose of furosemide with some improvement.  Does not have calf tenderness.  No significant edema in the legs noted.  Generalized anxiety disorder Persistently anxious.  He was on Xanax as needed.  He was changed over to Klonopin.  Lexapro was added.   Appaarently his sister passed away recently which has increased his anxiety levels.  His dyspnea is also contributing.  Headaches Patient reports occasional headaches with sharp pains at times.  CT head was unremarkable.    Hyponatremia Sodium level noted to be better today.  Urine osmolality 348, urine sodium less than 10.  He was given IV fluids yesterday. TSH 0.34. FT4 is normal. Cortisol 2.2. ACTH stim test shows normal response. No further w/u at this time.  Tobacco abuse Counseled.  Nicotine patch.  DVT Prophylaxis: Lovenox Code Status: Full code Family Communication: Discussed with patient Disposition Plan: Hopefully return home when improved.  Mobilize as tolerated.  Status is: Inpatient Remains inpatient appropriate because: COPD with acute exacerbation, community-acquired pneumonia      Medications: Scheduled:  azithromycin  500 mg Oral Q24H   budesonide (PULMICORT) nebulizer solution  0.5 mg Nebulization BID   enoxaparin (LOVENOX) injection  40 mg Subcutaneous Q24H   escitalopram  10 mg Oral Daily   feeding supplement  237 mL Oral TID BM   guaiFENesin  600 mg Oral BID   ipratropium  0.5 mg Nebulization TID   levalbuterol  0.63 mg Nebulization TID   multivitamin with minerals  1 tablet Oral Daily   nicotine  21 mg  Transdermal Daily   nystatin  5 mL Oral QID   predniSONE  60 mg Oral Q breakfast   Continuous:  cefTRIAXone (ROCEPHIN)  IV Stopped (07/22/21 1547)   YQM:VHQIONGEXBMWU **OR** acetaminophen, clonazePAM, diphenhydrAMINE, guaiFENesin-dextromethorphan, loperamide, ondansetron **OR** ondansetron (ZOFRAN) IV, sodium chloride  Antibiotics: Anti-infectives (From admission, onward)    Start     Dose/Rate Route Frequency Ordered Stop   07/20/21 1500  cefTRIAXone (ROCEPHIN) 2 g in sodium chloride 0.9 % 100 mL IVPB        2 g 200 mL/hr over 30 Minutes Intravenous Every 24 hours 07/19/21 1701 07/25/21 1459   07/19/21 1800  azithromycin (ZITHROMAX) tablet 500 mg        500 mg Oral Every 24 hours 07/19/21 1701 07/24/21 1759   07/19/21 1445  cefTRIAXone (ROCEPHIN) 1 g in sodium chloride 0.9 % 100 mL IVPB        1 g 200 mL/hr over 30 Minutes Intravenous  Once 07/19/21 1434 07/19/21 1523   07/19/21 1445  doxycycline (VIBRA-TABS) tablet 100 mg        100 mg Oral  Once 07/19/21 1434 07/19/21 1454       Objective:  Vital Signs  Vitals:   07/22/21 2056 07/23/21 0157 07/23/21 0537 07/23/21 0808  BP: 129/78  (!) 117/93   Pulse: (!) 107  87   Resp:      Temp: 98 F (36.7 C)  98 F (36.7 C)   TempSrc: Oral  Oral   SpO2: 93% 98% 95% 91%  Weight:      Height:        Intake/Output Summary (Last 24 hours) at 07/23/2021 1006 Last data filed at 07/23/2021 0542 Gross per 24 hour  Intake 340 ml  Output 1500 ml  Net -1160 ml    Filed Weights   07/19/21 1358  Weight: 59 kg    General appearance: Awake alert.  In no distress Resp: Tachypneic.  No use of accessory muscles.  Wheezing bilaterally.  Few crackles at the bases. Cardio: S1-S2 is normal regular.  No S3-S4.  No rubs murmurs or bruit GI: Abdomen is soft.  Nontender nondistended.  Bowel sounds are present normal.  No masses organomegaly Extremities: Pedal edema improved. Neurologic: Alert and oriented x3.  No focal neurological deficits.       Lab Results:  Data Reviewed: I have personally reviewed following labs and reports of the imaging studies  CBC: Recent Labs  Lab 07/19/21 1417 07/19/21 1720 07/20/21 0546 07/21/21 0553 07/22/21 0507  WBC 19.4* 20.1* 13.4* 15.3* 11.6*  NEUTROABS 18.0*  --   --   --   --   HGB 15.9 14.6 13.1 12.5* 13.3  HCT 45.8 42.4 37.1* 37.1* 38.8*  MCV 96.6 97.5 95.9 97.1 99.5  PLT 261 258 229 262 271     Basic Metabolic Panel: Recent Labs  Lab 07/19/21 1417 07/19/21 1720 07/20/21 0546 07/21/21 0553 07/22/21 0507  NA 135  --  132* 131* 138  K 3.8  --  3.6 4.0 4.7  CL 97*  --  94* 94* 100  CO2 29  --  28 30 32  GLUCOSE 126*  --  135*  107* 95  BUN 9  --  '17 18 12  '$ CREATININE 1.00 1.05 0.98 0.93 0.83  CALCIUM 9.1  --  8.7* 8.5* 8.8*  PHOS  --   --  3.5  --   --      GFR: Estimated Creatinine Clearance: 90.8 mL/min (by C-G formula based on SCr of 0.83 mg/dL).  Liver Function Tests: Recent Labs  Lab 07/19/21 1417 07/20/21 0546  AST 18 14*  ALT 16 14  ALKPHOS 100 75  BILITOT 1.1 0.7  PROT 7.3 5.8*  ALBUMIN 3.9 3.1*      Recent Results (from the past 240 hour(s))  SARS Coronavirus 2 by RT PCR (hospital order, performed in [facility] hospital lab) *cepheid single result test*     Status: None   Collection Time: 07/19/21  2:18 PM   Specimen: Nasal Swab  Result Value Ref Range Status   SARS Coronavirus 2 by RT PCR NEGATIVE NEGATIVE Final    Comment: (NOTE) SARS-CoV-2 target nucleic acids are NOT DETECTED.  The SARS-CoV-2 RNA is generally detectable in upper and lower respiratory specimens during the acute phase of infection. The lowest concentration of SARS-CoV-2 viral copies this assay can detect is 250 copies / mL. A negative result does not preclude SARS-CoV-2 infection and should not be used as the sole basis for treatment or other patient management decisions.  A negative result may occur with improper specimen collection / handling, submission of  specimen other than nasopharyngeal swab, presence of viral mutation(s) within the areas targeted by this assay, and inadequate number of viral copies (<250 copies / mL). A negative result must be combined with clinical observations, patient history, and epidemiological information.  Fact Sheet for Patients:   https://www.patel.info/  Fact Sheet for Healthcare Providers: https://hall.com/  This test is not yet approved or  cleared by the [place] FDA and has been authorized for detection and/or diagnosis of SARS-CoV-2 by FDA under an Emergency Use Authorization (EUA).  This EUA will remain in effect (meaning this test can be used) for the duration of the COVID-19 declaration under Section 564(b)(1) of the Act, 21 U.S.C. section 360bbb-3(b)(1), unless the authorization is terminated or revoked sooner.  Performed at [facility], [place] [address] [place], [place] [address]   Blood culture (routine single)     Status: None (Preliminary result)   Collection Time: 07/19/21  2:40 PM   Specimen: BLOOD  Result Value Ref Range Status   Specimen Description   Final    BLOOD RIGHT ANTECUBITAL Performed at [place] [address], [place], [place] [address]    Special Requests   Final    BOTTLES DRAWN AEROBIC AND ANAEROBIC Blood Culture adequate volume Performed at [place] [address], [place], [place] [address]    Culture   Final    NO GROWTH 4 DAYS Performed at [facility], [place] [address] [place], [place] [address]    Report Status PENDING  Incomplete  MRSA Next Gen by PCR, Nasal     Status: None   Collection Time: 07/19/21  5:02 PM   Specimen: Nasal Mucosa; Nasal Swab  Result Value Ref Range Status   MRSA by PCR Next Gen NOT DETECTED NOT DETECTED Final    Comment: (NOTE) The GeneXpert MRSA Assay (FDA approved for NASAL specimens only), is one component of a  comprehensive MRSA colonization surveillance program. It is not intended to diagnose MRSA infection nor to guide or  monitor treatment for MRSA infections. Test performance is not FDA approved in patients less than 37 years old. Performed at [facility], [place] [address], [place], [place] [address]   Expectorated Sputum Assessment w Gram Stain, Rflx to Resp Cult     Status: None   Collection Time: 07/20/21 10:51 AM   Specimen: Sputum  Result Value Ref Range Status   Specimen Description SPUTUM  Final   Special Requests NONE  Final   Sputum evaluation   Final    THIS SPECIMEN IS ACCEPTABLE FOR SPUTUM CULTURE Performed at [facility], [place] [address], [place], [place] [address]    Report Status 07/20/2021 FINAL  Final  Culture, Respiratory w Gram Stain     Status: None (Preliminary result)   Collection Time: 07/20/21 10:51 AM   Specimen: SPU  Result Value Ref Range Status   Specimen Description   Final    SPUTUM Performed at [place] [address], [place],  [address]    Special Requests   Final    NONE Reflexed from [id number] Performed at [facility], [place] [address], [place], [place] [address]    Gram Stain   Final    MODERATE WBC PRESENT,BOTH PMN AND MONONUCLEAR RARE GRAM POSITIVE RODS RARE YEAST    Culture   Final    CULTURE REINCUBATED FOR BETTER GROWTH Performed at [facility], [place] [address] [place],  [address]    Report Status PENDING  Incomplete       Radiology Studies: DG CHEST PORT 1 VIEW  Result Date: 07/22/2021 CLINICAL DATA:  Cough and shortness of breath beginning this morning. Emphysema. EXAM: PORTABLE CHEST 1 VIEW COMPARISON:  07/19/2021 FINDINGS: The heart size and mediastinal contours are within normal limits. Severe bullous emphysema again demonstrated. Both lungs are clear. No evidence of pneumothorax or pleural effusion. IMPRESSION: Severe bullous  emphysema. No active lung disease. Electronically Signed   By: Marlaine Hind M.D.   On: 07/22/2021 10:45   ECHOCARDIOGRAM COMPLETE  Result Date: 07/22/2021    ECHOCARDIOGRAM REPORT   Patient Name:   Brett Beck Date of Exam: 07/22/2021 Medical Rec #:  [id number]      Height:       73.0 in Accession #:    [id number]     Weight:       130.0 lb Date of Birth:  [birth year]      BSA:          1.792 m Patient Age:    15 years       BP:           118/85 mmHg Patient Gender: M              HR:           111 bpm. Exam Location:  Inpatient Procedure: 2D Echo, Cardiac Doppler and Color Doppler Indications:    Dyspnea R06.00  History:        Patient has no prior history of Echocardiogram examinations.                 Risk Factors:Current Smoker.  Sonographer:    Darlina Sicilian RDCS Referring Phys: [id number] [facility]  Sonographer Comments: Image acquisition challenging due to COPD. IMPRESSIONS  1. Left ventricular ejection fraction, by estimation, is 55 to 60%. The left ventricle has normal function. The left ventricle has no regional wall motion abnormalities. Left ventricular diastolic parameters were normal.  2. Right ventricular systolic function is normal. The right ventricular size is normal. There is severely elevated pulmonary artery systolic pressure.  3. The mitral valve is normal in structure. Trivial mitral valve regurgitation. No evidence of mitral stenosis.  4. The aortic valve is tricuspid. Aortic valve regurgitation is not visualized. No aortic stenosis is present.  5. The inferior vena cava is dilated in size with >50% respiratory variability, suggesting right atrial pressure of 8 mmHg. Comparison(s): No prior Echocardiogram. FINDINGS  Left Ventricle: Left ventricular ejection fraction, by estimation, is 55 to 60%. The left ventricle has normal function. The left ventricle has no regional wall motion abnormalities. The left ventricular internal cavity size was normal in size. There is  no left ventricular  hypertrophy. Left ventricular diastolic parameters were normal. Right Ventricle: The right ventricular size is normal. Right ventricular systolic function is normal. There is severely elevated pulmonary artery systolic pressure. The tricuspid regurgitant velocity is 3.69 m/s, and with an assumed right atrial pressure  of 8 mmHg, the estimated right ventricular systolic pressure is 03.0 mmHg. Left Atrium: Left atrial size was normal in size. Right Atrium: Right atrial size was normal in size. Pericardium: Trivial pericardial effusion is present. Mitral Valve: The mitral valve is normal in structure. Trivial mitral valve regurgitation. No evidence of mitral valve stenosis. Tricuspid Valve: The tricuspid valve is normal in structure. Tricuspid valve regurgitation is mild . No evidence of tricuspid stenosis. Aortic Valve: The aortic valve is tricuspid. Aortic valve regurgitation is not visualized. No aortic stenosis is present. Pulmonic Valve: The pulmonic valve was grossly normal. Pulmonic valve regurgitation is not visualized. No evidence of pulmonic stenosis. Aorta: The aortic root is normal in size and structure. Venous: The inferior vena cava is dilated in size with greater than 50% respiratory variability, suggesting right atrial pressure of 8 mmHg. IAS/Shunts: No atrial level shunt detected by color flow Doppler.  LEFT VENTRICLE PLAX 2D LVIDd:         4.30 cm LVIDs:         2.40 cm LV PW:         0.70 cm LV IVS:        0.60 cm LVOT diam:     2.20 cm LV SV:         52 LV SV Index:   29 LVOT Area:     3.80 cm  RIGHT VENTRICLE RV S prime:     16.70 cm/s TAPSE (M-mode): 2.3 cm LEFT ATRIUM             Index LA diam:        3.40 cm 1.90 cm/m LA Vol (A2C):   26.6 ml 14.85 ml/m LA Vol (A4C):   33.2 ml 18.53 ml/m LA Biplane Vol: 30.7 ml 17.13 ml/m  AORTIC VALVE LVOT Vmax:   80.00 cm/s LVOT Vmean:  44.700 cm/s LVOT VTI:    0.137 m  AORTA Ao Root diam: 3.40 cm Ao Asc diam:  2.80 cm TRICUSPID VALVE TR Peak grad:   54.5  mmHg TR Vmax:        369.00 cm/s  SHUNTS Systemic VTI:  0.14 m Systemic Diam: 2.20 cm Kirk Ruths MD Electronically signed by Kirk Ruths MD Signature Date/Time: 07/22/2021/8:31:47 AM    Final        LOS: 4 days   [facility] Pager on [email or web address]  07/23/2021, 10:06 AM

## 2021-07-23 NOTE — Progress Notes (Signed)
24 hour chart audit completed 

## 2021-07-24 DIAGNOSIS — J432 Centrilobular emphysema: Secondary | ICD-10-CM

## 2021-07-24 DIAGNOSIS — E43 Unspecified severe protein-calorie malnutrition: Secondary | ICD-10-CM

## 2021-07-24 LAB — CBC
HCT: 36.2 % — ABNORMAL LOW (ref 39.0–52.0)
Hemoglobin: 12.4 g/dL — ABNORMAL LOW (ref 13.0–17.0)
MCH: 33.3 pg (ref 26.0–34.0)
MCHC: 34.3 g/dL (ref 30.0–36.0)
MCV: 97.3 fL (ref 80.0–100.0)
Platelets: 266 10*3/uL (ref 150–400)
RBC: 3.72 MIL/uL — ABNORMAL LOW (ref 4.22–5.81)
RDW: 12.6 % (ref 11.5–15.5)
WBC: 8.3 10*3/uL (ref 4.0–10.5)
nRBC: 0 % (ref 0.0–0.2)

## 2021-07-24 LAB — BASIC METABOLIC PANEL
Anion gap: 7 (ref 5–15)
BUN: 10 mg/dL (ref 6–20)
CO2: 35 mmol/L — ABNORMAL HIGH (ref 22–32)
Calcium: 8.6 mg/dL — ABNORMAL LOW (ref 8.9–10.3)
Chloride: 96 mmol/L — ABNORMAL LOW (ref 98–111)
Creatinine, Ser: 0.97 mg/dL (ref 0.61–1.24)
GFR, Estimated: >60 mL/min (ref >60)
Glucose, Bld: 94 mg/dL (ref 70–99)
Potassium: 3.9 mmol/L (ref 3.5–5.1)
Sodium: 138 mmol/L (ref 135–145)

## 2021-07-24 LAB — CULTURE, BLOOD (SINGLE)
Culture: NO GROWTH
Special Requests: ADEQUATE

## 2021-07-24 MED ORDER — UMECLIDINIUM BROMIDE 62.5 MCG/ACT IN AEPB
1.0000 | INHALATION_SPRAY | Freq: Every day | RESPIRATORY_TRACT | Status: DC
  Administered 2021-07-25: 1 via RESPIRATORY_TRACT
  Filled 2021-07-24: qty 7

## 2021-07-24 MED ORDER — IPRATROPIUM BROMIDE 0.02 % IN SOLN
0.5000 mg | Freq: Two times a day (BID) | RESPIRATORY_TRACT | Status: AC
  Administered 2021-07-24: 0.5 mg via RESPIRATORY_TRACT
  Filled 2021-07-24: qty 2.5

## 2021-07-24 MED ORDER — MOMETASONE FURO-FORMOTEROL FUM 200-5 MCG/ACT IN AERO
2.0000 | INHALATION_SPRAY | Freq: Two times a day (BID) | RESPIRATORY_TRACT | Status: DC
  Administered 2021-07-25: 2 via RESPIRATORY_TRACT
  Filled 2021-07-24: qty 8.8

## 2021-07-24 MED ORDER — POTASSIUM CHLORIDE 20 MEQ PO PACK
20.0000 meq | PACK | Freq: Once | ORAL | Status: AC
  Administered 2021-07-24: 20 meq via ORAL
  Filled 2021-07-24: qty 1

## 2021-07-24 MED ORDER — NAPHAZOLINE-PHENIRAMINE 0.025-0.3 % OP SOLN
2.0000 [drp] | Freq: Four times a day (QID) | OPHTHALMIC | Status: DC | PRN
  Administered 2021-07-24: 2 [drp] via OPHTHALMIC
  Filled 2021-07-24 (×2): qty 5

## 2021-07-24 MED ORDER — LEVALBUTEROL HCL 0.63 MG/3ML IN NEBU
0.6300 mg | INHALATION_SOLUTION | Freq: Two times a day (BID) | RESPIRATORY_TRACT | Status: DC
  Administered 2021-07-24: 0.63 mg via RESPIRATORY_TRACT
  Filled 2021-07-24: qty 3

## 2021-07-24 MED ORDER — FUROSEMIDE 10 MG/ML IJ SOLN
40.0000 mg | Freq: Once | INTRAMUSCULAR | Status: AC
Start: 2021-07-24 — End: 2021-07-24
  Administered 2021-07-24: 40 mg via INTRAVENOUS
  Filled 2021-07-24: qty 4

## 2021-07-24 MED ORDER — UMECLIDINIUM BROMIDE 62.5 MCG/ACT IN AEPB: 1.0000 | INHALATION_SPRAY | Freq: Every day | RESPIRATORY_TRACT | Status: DC

## 2021-07-24 NOTE — TOC Initial Note (Signed)
Transition of Care [facility]) - Initial/Assessment Note   Patient Details  Name: Brett Beck MRN: [id number] Date of Birth: 08/16/1975  [facility] CM/SW Contact:    Sherie Don, LCSW Phone Number: 07/24/2021, 11:07 AM  Clinical Narrative: Patient will need a hospital follow up appointment as patient does not have a PCP and is uninsured. Patient agreeable to appointment at [facility]. Patient's hospital follow up appointment is scheduled for Wednesday August 28, 2021 at 2:30pm with Geryl Rankins, NP. The clinic will call patient to schedule an earlier appointment if one becomes available. Appointment added to AVS. CSW updated patient.  Expected Discharge Plan: Home/Self Care Barriers to Discharge: Continued Medical Work up, Inadequate or no insurance  Patient Goals and CMS Choice Patient states their goals for this hospitalization and ongoing recovery are:: Get a PCP Choice offered to / list presented to : NA  Expected Discharge Plan and Services Expected Discharge Plan: Home/Self Care In-house Referral: Clinical Social Work Discharge Planning Services: [place], [place] Acute Care Choice: NA Living arrangements for the past 2 months: Single Family Home              DME Arranged: N/A DME Agency: NA  Prior Living Arrangements/Services Living arrangements for the past 2 months: Single Family Home Patient language and need for interpreter reviewed:: Yes Do you feel safe going back to the place where you live?: Yes      Need for Family Participation in Patient Care: No (Comment) Care giver support system in place?: Yes (comment) Criminal Activity/Legal Involvement Pertinent to Current Situation/Hospitalization: No - Comment as needed  Activities of Daily Living Home Assistive Devices/Equipment: None ADL Screening (condition at time of admission) Patient's cognitive ability adequate to safely complete daily activities?: Yes Is the  patient deaf or have difficulty hearing?: No Does the patient have difficulty seeing, even when wearing glasses/contacts?: No Does the patient have difficulty concentrating, remembering, or making decisions?: No Patient able to express need for assistance with ADLs?: Yes Does the patient have difficulty dressing or bathing?: No Independently performs ADLs?: No Communication: Independent Dressing (OT): Independent Grooming: Independent Feeding: Independent Bathing: Independent Toileting: Needs assistance Is this a change from baseline?: Change from baseline, expected to last >3days In/Out Bed: Needs assistance Is this a change from baseline?: Change from baseline, expected to last >3 days Walks in Home: Needs assistance Is this a change from baseline?: Change from baseline, expected to last >3 days Does the patient have difficulty walking or climbing stairs?: Yes (gets sob) Weakness of Legs: Both Weakness of Arms/Hands: Both  Permission Sought/Granted Permission sought to share information with : Facility Art therapist granted to share information with : Yes, Verbal Permission Granted Share Information with NAME: [facility]  Emotional Assessment Attitude/Demeanor/Rapport: Engaged Affect (typically observed): Accepting Orientation: : Oriented to Self, Oriented to Place, Oriented to  Time, Oriented to Situation Alcohol / Substance Use: Tobacco Use  Admission diagnosis:  PNA (pneumonia) [J18.9] Pulmonary emphysema, unspecified emphysema type ([place]) [J43.9] Community acquired pneumonia, unspecified laterality [J18.9] Patient Active Problem List   Diagnosis Date Noted   PNA (pneumonia) 07/19/2021   COPD with acute exacerbation ([place]) 07/19/2021   Tobacco abuse 07/19/2021   Protein-calorie malnutrition, severe ([place]) 07/19/2021   MDD (major depressive disorder), recurrent severe, 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   Major depressive disorder,  recurrent severe without psychotic features ([place]) 10/02/2015   Anxiety    Opiate withdrawal ([place]) 03/21/2015  Substance induced mood disorder ([place]) 03/20/2015   GAD (generalized anxiety disorder)    Opioid use with withdrawal ([place]) 01/07/2015   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

## 2021-07-24 NOTE — Progress Notes (Signed)
[facility] PROGRESS NOTE    Progress Note  Brett Beck  TOI:[id number] DOB: 08-01-1975 DOA: 07/19/2021 PCP: Pcp, No     Brief Narrative:   Brett Beck is an 46 y.o. male past medical history significant for COPD emphysema comes in for cough and shortness of breath found to have pneumonia and COPD exacerbation.    Assessment/Plan:   Community-acquired pneumonia/with superimposed acute COPD exacerbation: Noted to have infiltrate on the right side, continue Rocephin has completed his azithromycin treatment. Urine antigens and HIV are also negative. Cultures have remained negative to date. Sputum culture Gram stain for gram-negative rods. He was continued on his inhalers. Had hemoptysis on 07/22/2021 chest x-ray repeated did not show any acute findings. Pulmonary was consulted, CT angio of the chest was done was negative for PE persistent right upper lobe pneumonia severe emphysema very small right pleural effusion. Pulmonary recommended to decrease steroids continue Brovana Pulmicort and Atrovent and schedule he would also encourage flutter valve and they have challenged him with diuretics today. Try to wean him to room air before discharge. He is negative about 3 L.  Creatinine has remained stable further diuretics per pulmonary.   Bilateral lower extremity edema: Echo showed normal systolic function, with elevated pulmonary pressures IV fluids were discontinued he was given IV Lasix  General anxiety disorder: Xanax as needed, Lexapro was added.  Occasional headaches: CT of the head was unremarkable.  Hypovolemic hyponatremia: Urinary sodium was less than 10 he was started on IV fluids, his sodium is improved.   PNA (pneumonia)  Moderate protein caloric malnutrition: Question due to COPD Estimated body mass index is 17.15 kg/m as calculated from the following:   Height as of this encounter: '6\' 1"'$  (1.854 m).   Weight as of this encounter: 59 kg. Malnutrition  Type:  Nutrition Problem: Increased nutrient needs Etiology: acute illness, chronic illness, catabolic illness   Malnutrition Characteristics:  Signs/Symptoms: estimated needs   Nutrition Interventions:  Interventions: Ensure Enlive (each supplement provides 350kcal and 20 grams of protein), MVI, Refer to RD note for recommendations    DVT prophylaxis: None Family Communication:none Status is: Inpatient Remains inpatient appropriate because: Pneumonia with hemoptysis    Code Status:     Code Status Orders  (From admission, onward)           Start     Ordered   07/19/21 1701  Full code  Continuous        07/19/21 1701           Code Status History     Date Active Date Inactive Code Status Order ID Comments User Context   02/13/2016 0002 02/15/2016 1653 Full Code [id number]  [name], NP Inpatient   02/11/2016 1704 02/12/2016 2348 Full Code [id number]  Dorie Rank, MD ED   10/02/2015 0703 10/02/2015 1833 Full Code [id number]  [name], NP Inpatient   10/01/2015 2150 10/02/2015 0702 Full Code [id number]  Laverle Hobby, PA-C Inpatient   10/01/2015 1517 10/01/2015 2127 Full Code [id number]  Zacarias Pontes, PA-C ED   07/16/2015 1738 07/17/2015 1449 Full Code [id number]  Davonna Belling, MD ED   03/22/2015 1228 03/22/2015 1642 Full Code [id number]  [name], [place] Inpatient   03/20/2015 2145 03/22/2015 1228 Full Code [id number]  Laverle Hobby, PA-C Inpatient   03/20/2015 1522 03/20/2015 2145 Full Code [id number]  Deno Etienne, DO ED   01/06/2015 1925 01/08/2015 1554 Full Code [id number]  Derrill Center, NP Inpatient  01/06/2015 1649 01/06/2015 1925 Full Code [id number]  Delfin Gant, NP Inpatient   01/06/2015 1649 01/06/2015 1649 Full Code [id number]  Delfin Gant, NP Inpatient   01/06/2015 1144 01/06/2015 1649 Full Code [id number]  Lacretia Leigh, MD ED   04/02/2014 1526 04/02/2014 2208 Full Code [id number]  Debby Freiberg, MD ED   08/22/2013 2313  08/23/2013 1445 Full Code [id number]  Martie Lee, PA-C ED   12/07/2011 0650 12/07/2011 1626 Full Code [id number]  Leotis Shames, PA-C ED         IV Access:   Peripheral IV   Procedures and diagnostic studies:   CT Angio Chest Pulmonary Embolism (PE) W or WO Contrast  Result Date: 07/23/2021 CLINICAL DATA:  Right-sided pleuritic chest pain. Shortness of breath and productive cough. EXAM: CT ANGIOGRAPHY CHEST WITH CONTRAST TECHNIQUE: Multidetector CT imaging of the chest was performed using the standard protocol during bolus administration of intravenous contrast. Multiplanar CT image reconstructions and MIPs were obtained to evaluate the vascular anatomy. RADIATION DOSE REDUCTION: This exam was performed according to the departmental dose-optimization program which includes automated exposure control, adjustment of the mA and/or kV according to patient size and/or use of iterative reconstruction technique. CONTRAST:  90m OMNIPAQUE IOHEXOL 350 MG/ML SOLN COMPARISON:  Chest CT 07/19/2021 FINDINGS: Cardiovascular: The heart is normal in size. No pericardial effusion. The aorta is normal in caliber. No dissection or atherosclerotic calcification. Branch vessels are patent. No obvious coronary artery calcifications. The pulmonary arterial tree is well opacified. No filling defects to suggest pulmonary embolism. Mediastinum/Nodes: No mediastinal or hilar mass or lymphadenopathy. The esophagus is grossly normal. Lungs/Pleura: Stable severe bullous emphysema. Persistent but improving right upper lobe pneumonia. Very small right pleural effusion. No pneumothorax. No worrisome pulmonary lesions. Upper Abdomen: No significant upper abdominal findings. Musculoskeletal: No significant bony findings. Review of the MIP images confirms the above findings. IMPRESSION: 1. No CT findings for pulmonary embolism. 2. Normal thoracic aorta. 3. Persistent but improving right upper lobe pneumonia. 4. Stable severe  bullous emphysema. 5. Very small right pleural effusion. Aortic Atherosclerosis (ICD10-I70.0) and Emphysema (ICD10-J43.9). Electronically Signed   By: PMarijo SanesM.D.   On: 07/23/2021 12:13     Medical Consultants:   None.   Subjective:    WRusty Ausrelates his breathing is better compared to yesterday.  Objective:    Vitals:   07/23/21 1945 07/23/21 2047 07/24/21 0436 07/24/21 0737  BP:  109/78 110/87   Pulse:  (!) 102 85   Resp:  18 18   Temp:  97.9 F (36.6 C) 97.6 F (36.4 C)   TempSrc:  Oral Oral   SpO2: 92% 94% 94% 95%  Weight:      Height:       SpO2: 95 % O2 Flow Rate (L/min): 1 L/min   Intake/Output Summary (Last 24 hours) at 07/24/2021 0856 Last data filed at 07/23/2021 1435 Gross per 24 hour  Intake 240 ml  Output 1750 ml  Net -1510 ml   Filed Weights   07/19/21 1358  Weight: 59 kg    Exam: General exam: In no acute distress. Respiratory system: Air movement with crackles bilaterally Cardiovascular system: S1 & S2 heard, RRR. No JVD, murmurs, rubs, gallops or clicks.  Gastrointestinal system: Abdomen is nondistended, soft and nontender.  Central nervous system: Alert and oriented. No focal neurological deficits. Extremities: No pedal edema. Skin: No rashes, lesions or ulcers Psychiatry: Judgement and insight appear normal. Mood & affect appropriate.  Data Reviewed:    Labs: Basic Metabolic Panel: Recent Labs  Lab 07/19/21 1417 07/19/21 1720 07/20/21 0546 07/21/21 0553 07/22/21 0507 07/24/21 0606  NA 135  --  132* 131* 138 138  K 3.8  --  3.6 4.0 4.7 3.9  CL 97*  --  94* 94* 100 96*  CO2 29  --  28 30 32 35*  GLUCOSE 126*  --  135* 107* 95 94  BUN 9  --  '17 18 12 10  '$ CREATININE 1.00 1.05 0.98 0.93 0.83 0.97  CALCIUM 9.1  --  8.7* 8.5* 8.8* 8.6*  PHOS  --   --  3.5  --   --   --    GFR Estimated Creatinine Clearance: 77.7 mL/min (by C-G formula based on SCr of 0.97 mg/dL). Liver Function Tests: Recent Labs  Lab  07/19/21 1417 07/20/21 0546  AST 18 14*  ALT 16 14  ALKPHOS 100 75  BILITOT 1.1 0.7  PROT 7.3 5.8*  ALBUMIN 3.9 3.1*   No results for input(s): LIPASE, AMYLASE in the last 168 hours. No results for input(s): AMMONIA in the last 168 hours. Coagulation profile No results for input(s): INR, PROTIME in the last 168 hours. COVID-19 Labs  No results for input(s): DDIMER, FERRITIN, LDH, CRP in the last 72 hours.  Lab Results  Component Value Date   [place] NEGATIVE 07/19/2021    CBC: Recent Labs  Lab 07/19/21 1417 07/19/21 1720 07/20/21 0546 07/21/21 0553 07/22/21 0507 07/24/21 0606  WBC 19.4* 20.1* 13.4* 15.3* 11.6* 8.3  NEUTROABS 18.0*  --   --   --   --   --   HGB 15.9 14.6 13.1 12.5* 13.3 12.4*  HCT 45.8 42.4 37.1* 37.1* 38.8* 36.2*  MCV 96.6 97.5 95.9 97.1 99.5 97.3  PLT 261 258 229 262 271 266   Cardiac Enzymes: No results for input(s): CKTOTAL, CKMB, CKMBINDEX, TROPONINI in the last 168 hours. BNP (last 3 results) No results for input(s): PROBNP in the last 8760 hours. CBG: No results for input(s): GLUCAP in the last 168 hours. D-Dimer: No results for input(s): DDIMER in the last 72 hours. Hgb A1c: No results for input(s): HGBA1C in the last 72 hours. Lipid Profile: No results for input(s): CHOL, HDL, LDLCALC, TRIG, CHOLHDL, LDLDIRECT in the last 72 hours. Thyroid function studies: Recent Labs    07/22/21 0507  TSH 0.349*   Anemia work up: No results for input(s): VITAMINB12, FOLATE, FERRITIN, TIBC, IRON, RETICCTPCT in the last 72 hours. Sepsis Labs: Recent Labs  Lab 07/19/21 1440 07/19/21 1610 07/19/21 1720 07/20/21 0546 07/21/21 0553 07/22/21 0507 07/24/21 0606  WBC  --   --    < > 13.4* 15.3* 11.6* 8.3  LATICACIDVEN 1.4 1.3  --   --   --   --   --    < > = values in this interval not displayed.   Microbiology Recent Results (from the past 240 hour(s))  SARS Coronavirus 2 by RT PCR (hospital order, performed in [facility] hospital lab)  *cepheid single result test*     Status: None   Collection Time: 07/19/21  2:18 PM   Specimen: Nasal Swab  Result Value Ref Range Status   SARS Coronavirus 2 by RT PCR NEGATIVE NEGATIVE Final    Comment: (NOTE) SARS-CoV-2 target nucleic acids are NOT DETECTED.  The SARS-CoV-2 RNA is generally detectable in upper and lower respiratory specimens during the acute phase of infection. The lowest concentration of SARS-CoV-2 viral  copies this assay can detect is 250 copies / mL. A negative result does not preclude SARS-CoV-2 infection and should not be used as the sole basis for treatment or other patient management decisions.  A negative result may occur with improper specimen collection / handling, submission of specimen other than nasopharyngeal swab, presence of viral mutation(s) within the areas targeted by this assay, and inadequate number of viral copies (<250 copies / mL). A negative result must be combined with clinical observations, patient history, and epidemiological information.  Fact Sheet for Patients:   https://www.patel.info/  Fact Sheet for Healthcare Providers: https://hall.com/  This test is not yet approved or  cleared by the [place] FDA and has been authorized for detection and/or diagnosis of SARS-CoV-2 by FDA under an Emergency Use Authorization (EUA).  This EUA will remain in effect (meaning this test can be used) for the duration of the COVID-19 declaration under Section 564(b)(1) of the Act, 21 U.S.C. section 360bbb-3(b)(1), unless the authorization is terminated or revoked sooner.  Performed at [facility], [place] [address], [place], [place] [address]   Blood culture (routine single)     Status: None (Preliminary result)   Collection Time: 07/19/21  2:40 PM   Specimen: BLOOD  Result Value Ref Range Status   Specimen Description   Final    BLOOD RIGHT ANTECUBITAL Performed at [place] [address] [place], [place] [address]    Special Requests   Final    BOTTLES DRAWN AEROBIC AND ANAEROBIC Blood Culture adequate volume Performed at [place] [address] [place], [place] [address]    Culture   Final    NO GROWTH 4 DAYS Performed at [facility], [place] [address], [place], [place] [address]    Report Status PENDING  Incomplete  MRSA Next Gen by PCR, Nasal     Status: None   Collection Time: 07/19/21  5:02 PM   Specimen: Nasal Mucosa; Nasal Swab  Result Value Ref Range Status   MRSA by PCR Next Gen NOT DETECTED NOT DETECTED Final    Comment: (NOTE) The GeneXpert MRSA Assay (FDA approved for NASAL specimens only), is one component of a comprehensive MRSA colonization surveillance program. It is not intended to diagnose MRSA infection nor to guide or monitor treatment for MRSA infections. Test performance is not FDA approved in patients less than 52 years old. Performed at [facility], [place] [address] [place], [place] [address]   Expectorated Sputum Assessment w Gram Stain, Rflx to Resp Cult     Status: None   Collection Time: 07/20/21 10:51 AM   Specimen: Sputum  Result Value Ref Range Status   Specimen Description SPUTUM  Final   Special Requests NONE  Final   Sputum evaluation   Final    THIS SPECIMEN IS ACCEPTABLE FOR SPUTUM CULTURE Performed at [facility], [place] [address] [place], [place] [address]    Report Status 07/20/2021 FINAL  Final  Culture, Respiratory w Gram Stain     Status: None   Collection Time: 07/20/21 10:51 AM   Specimen: SPU  Result Value Ref Range Status   Specimen Description   Final    SPUTUM Performed at [facility] [address], [place], [place] [address]    Special Requests   Final    NONE Reflexed from [id number] Performed at [facility], [place] [address] [place], [place] [address]  Gram  Stain   Final    MODERATE WBC PRESENT,BOTH PMN AND MONONUCLEAR RARE GRAM POSITIVE RODS RARE YEAST    Culture   Final    RARE Normal respiratory flora-no Staph aureus or Pseudomonas seen Performed at [facility], [address], [place], [place] [address]    Report Status 07/23/2021 FINAL  Final     Medications:    arformoterol  15 mcg Nebulization BID   budesonide (PULMICORT) nebulizer solution  0.5 mg Nebulization BID   enoxaparin (LOVENOX) injection  40 mg Subcutaneous Q24H   escitalopram  10 mg Oral Daily   feeding supplement  237 mL Oral TID BM   guaiFENesin  600 mg Oral BID   ipratropium  0.5 mg Nebulization BID   levalbuterol  0.63 mg Nebulization BID   multivitamin with minerals  1 tablet Oral Daily   nicotine  21 mg Transdermal Daily   nystatin  5 mL Oral QID   predniSONE  40 mg Oral Q breakfast   Continuous Infusions:  cefTRIAXone (ROCEPHIN)  IV Stopped (07/23/21 1449)      LOS: 5 days   Charlynne Cousins  [facility]  07/24/2021, 8:56 AM

## 2021-07-24 NOTE — Progress Notes (Addendum)
NAME:  Brett Beck, MRN:  [id number], DOB:  November 03, 1975, LOS: 5 ADMISSION DATE:  07/19/2021, CONSULTATION DATE:  07/23/21 REFERRING MD:  [place] Pink, CHIEF COMPLAINT:  Cough and dyspnea   History of Present Illness:  53y M with history of COPD/emphysema seen in past by Dr. Gwenette Greet A1AT MZ, PTX in 1998 and 2000 s/p VATS in 2000, opioid use who was admitted 07/19/21 after presentin gto [facility] ED with acute productive cough, dyspnea, R pleuritic CP. VBG on admission showed compensated chronic hypercapnia. He was started on bronchodilators, steroids, CAP coverage. Over last day or so has developed some hemoptysis. He has remained on 2L O2 during admission with good oxygen saturation. With slow improvement on 5/30 primary team ordered CTA Chest and consulted pulmonary.   He has no family history of lung cancer, no personal history of VTE.   Pertinent  Medical History  COPD Severe bullous emphysema  Significant Hospital Events: Including procedures, antibiotic start and stop dates in addition to other pertinent events   5/26 Admit with Cough, SOB. Started on ceftriaxone, azithro, steroids, bronchodilators 5/30 PCCM consulted for pulmonary evaluation. CTA Chest negative for PE. RUL PNA.  5/31 O2 down to 1L, 1.7L UOP overnight/improved LE swelling   Interim History / Subjective:  Afebrile  O2 weaned to 1L  I/O 1.7L UOP, -1.5L in last 24 hours  Pt reports feeling much better, urinated well overnight / feels swelling is down, thinks klonopin has really helped with SOB & anxiety  Objective   Blood pressure 110/87, pulse 85, temperature 97.6 F (36.4 C), temperature source Oral, resp. rate 18, height '6\' 1"'$  (1.854 m), weight 59 kg, SpO2 95 %.        Intake/Output Summary (Last 24 hours) at 07/24/2021 9485 Last data filed at 07/23/2021 1435 Gross per 24 hour  Intake 240 ml  Output 1750 ml  Net -1510 ml   Filed Weights   07/19/21 1358  Weight: 59 kg    Examination: General: thin adult male  sitting up on side of bed in NAD HEENT: MM pink/moist, anicteric, pupils =/reactive  Neuro: AAOx4, speech clear, MAE CV: s1s2 RRR, no m/r/g PULM: non-labored at rest, lungs bilaterally with good air entry, no wheezing GI: soft, bsx4 active  Extremities: warm/dry, improved pedal/LE edema > now 1+  Skin: no rashes or lesions   Resolved Hospital Problem list     Assessment & Plan:   # RUL CAP # Acute exacerbation of COPD # Severe bullous emphysema, PiMZ # Chronic hypercapnic respiratory failure # Severely elevated RVSP # Severe Protein Calorie Malnutrition    -brovana, pulmicort + atrovent nebs through 5/31  -transition to [facility], [place] as of 6/1 am > would plan for this at discharge.  Encourage the patient to take these inhalers with him at discharge.  Discussed meds with Social Work for [employer]) -he will likely need samples at time of hospital follow up with NP -continue prednisone 40 mg QD, taper off over next week -complete abx -continue flutter valve -repeat lasix today, plan for intermittent/PRN low dose diuresis ('20mg'$  PRN)at discharge -further pulmonary HTN work up can be completed as outpatient  -patient will need follow CT chest in 6-8 weeks to ensure clearance of RUL consolidation  -smoking cessation  -klonopin would be reasonable given the severity of his lung disease  -ensure BID    Best Practice (right click and "Reselect all SmartList Selections" daily)  Per primary  Critical care time: n/a     Zacchaeus Halm  Alfredo Martinez, MSN, APRN, NP-C, AGACNP-BC  Pulmonary & Critical Care 07/24/2021, 8:28 AM   Please see [email or web address] for pager details.   From 7A-7P if no response, please call [phone] After hours, please call ELink [phone]

## 2021-07-25 DIAGNOSIS — J439 Emphysema, unspecified: Secondary | ICD-10-CM

## 2021-07-25 MED ORDER — BUSPIRONE HCL 7.5 MG PO TABS: 7.5000 mg | ORAL_TABLET | Freq: Two times a day (BID) | ORAL | 0 refills | Status: DC

## 2021-07-25 MED ORDER — MOMETASONE FURO-FORMOTEROL FUM 200-5 MCG/ACT IN AERO: 2.0000 | INHALATION_SPRAY | Freq: Two times a day (BID) | RESPIRATORY_TRACT | 3 refills | Status: DC

## 2021-07-25 MED ORDER — PREDNISONE 10 MG PO TABS: ORAL_TABLET | ORAL | 0 refills | Status: DC

## 2021-07-25 MED ORDER — UMECLIDINIUM BROMIDE 62.5 MCG/ACT IN AEPB
1.0000 | INHALATION_SPRAY | Freq: Every day | RESPIRATORY_TRACT | 3 refills | Status: DC
Start: 2021-07-26 — End: 2023-08-04

## 2021-07-25 NOTE — TOC Transition Note (Signed)
Transition of Care [facility]) - CM/SW Discharge Note   Patient Details  Name: Brett Beck MRN: [id number] Date of Birth: [birth year]  Transition of [facility]) CM/SW Contact:  Lynnell Catalan, RN Phone Number: 07/25/2021, 11:06 AM   Clinical Narrative:    Pt entered into the [facility] program. [place] letter provided to pt. Instructions for use provided to pt and staff nurse.  Home DME oxygen ordered. Desaturation screen done by nursing staff. [employer] contacted to provide pt with charity home 02.     Barriers to Discharge: Continued Medical Work up, Inadequate or no insurance   Patient Goals and CMS Choice Patient states their goals for this hospitalization and ongoing recovery are:: Get a PCP   Choice offered to / list presented to : NA  Discharge Plan and Services In-house Referral: Clinical Social Work Discharge Planning Services: [employer] [facility], [facility] Post Acute Care Choice: NA          DME Arranged: N/A DME Agency: NA    Readmission Risk Interventions     View : No data to display.

## 2021-07-25 NOTE — Discharge Summary (Signed)
Physician Discharge Summary  Brett Beck OQH:[id number] DOB: 12-15-1975 DOA: 07/19/2021  PCP: Pcp, No  Admit date: 07/19/2021 Discharge date: 07/25/2021  Admitted From: Home Disposition:  Home  Recommendations for Outpatient Follow-up:  Follow up with PCP in 1-2 weeks at the wellness center. He will follow-up with pulmonary in 2 to 3 weeks. Please obtain BMP/CBC in one week   Home Health:No Equipment/Devices:None  Discharge Condition:Stable CODE STATUS:Full Diet recommendation: Heart Healthy   Brief/Interim Summary: 46 y.o. male past medical history significant for COPD emphysema comes in for cough and shortness of breath found to have pneumonia and COPD exacerbation  Discharge Diagnoses:  Principal Problem:   PNA (pneumonia) Active Problems:   Depression   Emphysema lung (HCC)   COPD with acute exacerbation ([place])   Tobacco abuse   Protein-calorie malnutrition, severe ([place])  Acute respiratory failure with hypoxia secondary to community-acquired pneumonia with superimposed acute COPD exacerbation: He was started empirically on IV Rocephin and azithromycin and completed his treatment in house. Culture data remain negative till date. He had hemoptysis on 07/22/2021 chest x-ray showed no infiltrate pulmonary was consulted who recommended a CT angio to rule out a PE which was negative for PE.  It showed right upper lobe pneumonia. Pulmonary recommended to continue steroids Profilnine inhalers given 2 doses and IV Lasix and was weaned to room air. Pulmonary will follow up as an outpatient .  Bilateral lower extremity edema: 2D echo showed normal systolic function elevated pressures. He was given IV Lasix.  We will follow-up with pulmonary as an outpatient.  General anxiety disorder: Discontinue Xanax continue BuSpar as an outpatient.  Hypovolemic hyponatremia: Urine sodium was less than 10 he was started on IV fluids is resolved.    Discharge Instructions  Discharge  Instructions     Diet - low sodium heart healthy   Complete by: As directed    Increase activity slowly   Complete by: As directed       Allergies as of 07/25/2021       Reactions   Effexor [venlafaxine] Diarrhea   Hydrocodone-acetaminophen    REACTION: severe headaches   Ibuprofen Other (See Comments)   Severe stomach issues   Lyrica [pregabalin] Diarrhea   Swelling in the feet   Neurontin [gabapentin] Other (See Comments)   Severe stomach issues   Nsaids Other (See Comments)   Severe stomach issues   Tramadol Hcl    REACTION: diarrhea, abdominal pain        Medication List     STOP taking these medications    FLUoxetine 40 MG capsule Commonly known as: PROZAC       TAKE these medications    acetaminophen 500 MG tablet Commonly known as: TYLENOL Take 500 mg by mouth every 6 (six) hours as needed for moderate pain.   albuterol 108 (90 Base) MCG/ACT inhaler Commonly known as: VENTOLIN HFA Inhale 2 puffs into the lungs every 6 (six) hours as needed for wheezing or shortness of breath.   busPIRone 7.5 MG tablet Commonly known as: BUSPAR Take 1 tablet (7.5 mg total) by mouth 2 (two) times daily.   hydrOXYzine 25 MG tablet Commonly known as: ATARAX Take 1 tablet (25 mg total) by mouth every 6 (six) hours as needed for anxiety.   lidocaine 5 % Commonly known as: Lidoderm Place 1 patch onto the skin daily. Remove & Discard patch within 12 hours or as directed by MD   mometasone-formoterol 200-5 MCG/ACT Aero Commonly known as: DULERA Inhale  2 puffs into the lungs 2 (two) times daily.   naproxen 500 MG tablet Commonly known as: Naprosyn Take 1 tablet (500 mg total) by mouth 2 (two) times daily with a meal.   nicotine polacrilex 2 MG gum Commonly known as: NICORETTE Take 1 each (2 mg total) by mouth as needed for smoking cessation.   predniSONE 10 MG tablet Commonly known as: DELTASONE Takes 6 tablets for 1 days, then 5 tablets for 1 days, then 4  tablets for 1 days, then 3 tablets for 1 days, then 2 tabs for 1 days, then 1 tab for 1 days, and then stop.   traZODone 150 MG tablet Commonly known as: DESYREL Take 1 tablet (150 mg total) by mouth at bedtime.   umeclidinium bromide 62.5 MCG/ACT Aepb Commonly known as: INCRUSE ELLIPTA Inhale 1 puff into the lungs daily. Start taking on: July 26, 2021        Follow-up Information     [facility]. Go on 08/28/2021.   Why: Your hospital follow up appointment is scheduled for Wednesday August 28, 2021 at 2:30pm with Geryl Rankins, NP. The clinic will call to schedule an earlier appointment if one becomes available. Contact information: [place] [address] [phone]        Martyn Ehrich, NP Follow up on 08/02/2021.   Specialty: Pulmonary Disease Why: Hospital Follow Up / Pulmonary - Appt Friday June 9th at 2:30 PM.  Please arrive at 2:15 for check in. Contact information: [address] [address] [phone]                Allergies  Allergen Reactions   Effexor [Venlafaxine] Diarrhea   Hydrocodone-Acetaminophen     REACTION: severe headaches   Ibuprofen Other (See Comments)    Severe stomach issues   Lyrica [Pregabalin] Diarrhea    Swelling in the feet   Neurontin [Gabapentin] Other (See Comments)    Severe stomach issues   Nsaids Other (See Comments)    Severe stomach issues   Tramadol Hcl     REACTION: diarrhea, abdominal pain    Consultations: Pulmonary and critical care   Procedures/Studies: CT HEAD WO CONTRAST (5MM)  Result Date: 07/20/2021 CLINICAL DATA:  Headache, chronic, with new features or increased frequency. EXAM: CT HEAD WITHOUT CONTRAST TECHNIQUE: Contiguous axial images were obtained from the base of the skull through the vertex without intravenous contrast. RADIATION DOSE REDUCTION: This exam was performed according to the departmental  dose-optimization program which includes automated exposure control, adjustment of the mA and/or kV according to patient size and/or use of iterative reconstruction technique. COMPARISON:  None Available. FINDINGS: Brain: No evidence of acute infarction, hemorrhage, hydrocephalus, extra-axial collection or mass lesion/mass effect. Vascular: No hyperdense vessel or unexpected calcification. Skull: Normal. Negative for fracture or focal lesion. Sinuses/Orbits: No acute finding. IMPRESSION: Negative head CT. Electronically Signed   By: Jorje Guild M.D.   On: 07/20/2021 10:30   CT Chest Wo Contrast  Result Date: 07/19/2021 CLINICAL DATA:  Shortness of breath, right chest pain, pneumonia EXAM: CT CHEST WITHOUT CONTRAST TECHNIQUE: Multidetector CT imaging of the chest was performed following the standard protocol without IV contrast. RADIATION DOSE REDUCTION: This exam was performed according to the departmental dose-optimization program which includes automated exposure control, adjustment of the mA and/or kV according to patient size and/or use of iterative reconstruction technique. COMPARISON:  Previous studies including chest radiograph done earlier  today and CT done on 05/27/2016 FINDINGS: Cardiovascular: There is ectasia of main pulmonary artery measuring 3.6 cm suggesting pulmonary arterial hypertension. Mediastinum/Nodes: No significant lymphadenopathy seen. Lungs/Pleura: Severe bullous emphysema is seen in both lungs especially in the right upper lung fields and left lower lung fields. There is new patchy alveolar infiltrate in the anterior segment of right upper lobe. There is subtle increase in interstitial markings in the lateral aspect of right mid lung fields. Rest of the lung fields show no new infiltrates. There is no pleural effusion or pneumothorax. Upper Abdomen: Unremarkable. Musculoskeletal: Unremarkable. IMPRESSION: Severe COPD with bullous emphysema. There is new alveolar and interstitial  infiltrate in the anterior segment of right upper lobe consistent with pneumonia. There is no pleural effusion or pneumothorax. Electronically Signed   By: Elmer Picker M.D.   On: 07/19/2021 15:20   CT Angio Chest Pulmonary Embolism (PE) W or WO Contrast  Result Date: 07/23/2021 CLINICAL DATA:  Right-sided pleuritic chest pain. Shortness of breath and productive cough. EXAM: CT ANGIOGRAPHY CHEST WITH CONTRAST TECHNIQUE: Multidetector CT imaging of the chest was performed using the standard protocol during bolus administration of intravenous contrast. Multiplanar CT image reconstructions and MIPs were obtained to evaluate the vascular anatomy. RADIATION DOSE REDUCTION: This exam was performed according to the departmental dose-optimization program which includes automated exposure control, adjustment of the mA and/or kV according to patient size and/or use of iterative reconstruction technique. CONTRAST:  70m OMNIPAQUE IOHEXOL 350 MG/ML SOLN COMPARISON:  Chest CT 07/19/2021 FINDINGS: Cardiovascular: The heart is normal in size. No pericardial effusion. The aorta is normal in caliber. No dissection or atherosclerotic calcification. Branch vessels are patent. No obvious coronary artery calcifications. The pulmonary arterial tree is well opacified. No filling defects to suggest pulmonary embolism. Mediastinum/Nodes: No mediastinal or hilar mass or lymphadenopathy. The esophagus is grossly normal. Lungs/Pleura: Stable severe bullous emphysema. Persistent but improving right upper lobe pneumonia. Very small right pleural effusion. No pneumothorax. No worrisome pulmonary lesions. Upper Abdomen: No significant upper abdominal findings. Musculoskeletal: No significant bony findings. Review of the MIP images confirms the above findings. IMPRESSION: 1. No CT findings for pulmonary embolism. 2. Normal thoracic aorta. 3. Persistent but improving right upper lobe pneumonia. 4. Stable severe bullous emphysema. 5. Very  small right pleural effusion. Aortic Atherosclerosis (ICD10-I70.0) and Emphysema (ICD10-J43.9). Electronically Signed   By: PMarijo SanesM.D.   On: 07/23/2021 12:13   DG CHEST PORT 1 VIEW  Result Date: 07/22/2021 CLINICAL DATA:  Cough and shortness of breath beginning this morning. Emphysema. EXAM: PORTABLE CHEST 1 VIEW COMPARISON:  07/19/2021 FINDINGS: The heart size and mediastinal contours are within normal limits. Severe bullous emphysema again demonstrated. Both lungs are clear. No evidence of pneumothorax or pleural effusion. IMPRESSION: Severe bullous emphysema. No active lung disease. Electronically Signed   By: JMarlaine HindM.D.   On: 07/22/2021 10:45   DG Chest Portable 1 View  Result Date: 07/19/2021 CLINICAL DATA:  Cough, shortness of breath EXAM: PORTABLE CHEST 1 VIEW COMPARISON:  Previous studies including the examination of 05/26/2016 FINDINGS: Cardiac size is within normal limits. Low position of diaphragms suggests COPD. Bullous emphysema is seen in both lungs, more so in the right upper and left lower lung fields. Linear densities in the right parahilar region have not changed significantly. There are no signs of alveolar pulmonary edema or new focal consolidation. There is interval increase in interstitial markings in the right lower lung fields. There is no pleural effusion or pneumothorax. IMPRESSION:  Severe COPD with bullous emphysema. There is no focal pulmonary consolidation. There is slight increase in interstitial markings in the right lower lung fields which may be due to crowding of bronchovascular structures or interstitial pneumonia. Electronically Signed   By: Elmer Picker M.D.   On: 07/19/2021 14:33   ECHOCARDIOGRAM COMPLETE  Result Date: 07/22/2021    ECHOCARDIOGRAM REPORT   Patient Name:   Brett Beck Date of Exam: 07/22/2021 Medical Rec #:  [id number]      Height:       73.0 in Accession #:    [id number]     Weight:       130.0 lb Date of Birth:  [birth year]       BSA:          1.792 m Patient Age:    46 years       BP:           118/85 mmHg Patient Gender: M              HR:           111 bpm. Exam Location:  Inpatient Procedure: 2D Echo, Cardiac Doppler and Color Doppler Indications:    Dyspnea R06.00  History:        Patient has no prior history of Echocardiogram examinations.                 Risk Factors:Current Smoker.  Sonographer:    Darlina Sicilian RDCS Referring Phys: [id number] [facility]  Sonographer Comments: Image acquisition challenging due to COPD. IMPRESSIONS  1. Left ventricular ejection fraction, by estimation, is 55 to 60%. The left ventricle has normal function. The left ventricle has no regional wall motion abnormalities. Left ventricular diastolic parameters were normal.  2. Right ventricular systolic function is normal. The right ventricular size is normal. There is severely elevated pulmonary artery systolic pressure.  3. The mitral valve is normal in structure. Trivial mitral valve regurgitation. No evidence of mitral stenosis.  4. The aortic valve is tricuspid. Aortic valve regurgitation is not visualized. No aortic stenosis is present.  5. The inferior vena cava is dilated in size with >50% respiratory variability, suggesting right atrial pressure of 8 mmHg. Comparison(s): No prior Echocardiogram. FINDINGS  Left Ventricle: Left ventricular ejection fraction, by estimation, is 55 to 60%. The left ventricle has normal function. The left ventricle has no regional wall motion abnormalities. The left ventricular internal cavity size was normal in size. There is  no left ventricular hypertrophy. Left ventricular diastolic parameters were normal. Right Ventricle: The right ventricular size is normal. Right ventricular systolic function is normal. There is severely elevated pulmonary artery systolic pressure. The tricuspid regurgitant velocity is 3.69 m/s, and with an assumed right atrial pressure  of 8 mmHg, the estimated right ventricular systolic pressure  is 58.0 mmHg. Left Atrium: Left atrial size was normal in size. Right Atrium: Right atrial size was normal in size. Pericardium: Trivial pericardial effusion is present. Mitral Valve: The mitral valve is normal in structure. Trivial mitral valve regurgitation. No evidence of mitral valve stenosis. Tricuspid Valve: The tricuspid valve is normal in structure. Tricuspid valve regurgitation is mild . No evidence of tricuspid stenosis. Aortic Valve: The aortic valve is tricuspid. Aortic valve regurgitation is not visualized. No aortic stenosis is present. Pulmonic Valve: The pulmonic valve was grossly normal. Pulmonic valve regurgitation is not visualized. No evidence of pulmonic stenosis. Aorta: The aortic root is normal in size and structure. Venous: The inferior  vena cava is dilated in size with greater than 50% respiratory variability, suggesting right atrial pressure of 8 mmHg. IAS/Shunts: No atrial level shunt detected by color flow Doppler.  LEFT VENTRICLE PLAX 2D LVIDd:         4.30 cm LVIDs:         2.40 cm LV PW:         0.70 cm LV IVS:        0.60 cm LVOT diam:     2.20 cm LV SV:         52 LV SV Index:   29 LVOT Area:     3.80 cm  RIGHT VENTRICLE RV S prime:     16.70 cm/s TAPSE (M-mode): 2.3 cm LEFT ATRIUM             Index LA diam:        3.40 cm 1.90 cm/m LA Vol (A2C):   26.6 ml 14.85 ml/m LA Vol (A4C):   33.2 ml 18.53 ml/m LA Biplane Vol: 30.7 ml 17.13 ml/m  AORTIC VALVE LVOT Vmax:   80.00 cm/s LVOT Vmean:  44.700 cm/s LVOT VTI:    0.137 m  AORTA Ao Root diam: 3.40 cm Ao Asc diam:  2.80 cm TRICUSPID VALVE TR Peak grad:   54.5 mmHg TR Vmax:        369.00 cm/s  SHUNTS Systemic VTI:  0.14 m Systemic Diam: 2.20 cm Kirk Ruths MD Electronically signed by Kirk Ruths MD Signature Date/Time: 07/22/2021/8:31:47 AM    Final      Subjective: No complaints  Discharge Exam: Vitals:   07/25/21 0505 07/25/21 0805  BP: 107/76   Pulse: (!) 108   Resp:    Temp: 98.1 F (36.7 C)   SpO2: 91% 91%    Vitals:   07/24/21 2005 07/24/21 2005 07/25/21 0505 07/25/21 0805  BP: 110/73 110/73 107/76   Pulse: (!) 107 (!) 105 (!) 108   Resp:      Temp: 97.9 F (36.6 C) 97.9 F (36.6 C) 98.1 F (36.7 C)   TempSrc: Oral Oral Oral   SpO2: 94% 95% 91% 91%  Weight:      Height:        General: Pt is alert, awake, not in acute distress Cardiovascular: RRR, S1/S2 +, no rubs, no gallops Respiratory: CTA bilaterally, no wheezing, no rhonchi Abdominal: Soft, NT, ND, bowel sounds + Extremities: no edema, no cyanosis    The results of significant diagnostics from this hospitalization (including imaging, microbiology, ancillary and laboratory) are listed below for reference.     Microbiology: Recent Results (from the past 240 hour(s))  SARS Coronavirus 2 by RT PCR (hospital order, performed in [facility] hospital lab) *cepheid single result test*     Status: None   Collection Time: 07/19/21  2:18 PM   Specimen: Nasal Swab  Result Value Ref Range Status   SARS Coronavirus 2 by RT PCR NEGATIVE NEGATIVE Final    Comment: (NOTE) SARS-CoV-2 target nucleic acids are NOT DETECTED.  The SARS-CoV-2 RNA is generally detectable in upper and lower respiratory specimens during the acute phase of infection. The lowest concentration of SARS-CoV-2 viral copies this assay can detect is 250 copies / mL. A negative result does not preclude SARS-CoV-2 infection and should not be used as the sole basis for treatment or other patient management decisions.  A negative result may occur with improper specimen collection / handling, submission of specimen other than nasopharyngeal swab, presence of viral  mutation(s) within the areas targeted by this assay, and inadequate number of viral copies (<250 copies / mL). A negative result must be combined with clinical observations, patient history, and epidemiological information.  Fact Sheet for Patients:   https://www.patel.info/  Fact Sheet  for Healthcare Providers: https://hall.com/  This test is not yet approved or  cleared by the [place] FDA and has been authorized for detection and/or diagnosis of SARS-CoV-2 by FDA under an Emergency Use Authorization (EUA).  This EUA will remain in effect (meaning this test can be used) for the duration of the COVID-19 declaration under Section 564(b)(1) of the Act, 21 U.S.C. section 360bbb-3(b)(1), unless the authorization is terminated or revoked sooner.  Performed at [facility], [place] [address], [place], [place] [address]   Blood culture (routine single)     Status: None   Collection Time: 07/19/21  2:40 PM   Specimen: BLOOD  Result Value Ref Range Status   Specimen Description   Final    BLOOD RIGHT ANTECUBITAL Performed at [facility] [address] [place], [place] [address]    Special Requests   Final    BOTTLES DRAWN AEROBIC AND ANAEROBIC Blood Culture adequate volume Performed at [facility] [address], [place], [place] [address]    Culture   Final    NO GROWTH 5 DAYS Performed at [facility], [place] [address] [place], [place] [address]    Report Status 07/24/2021 FINAL  Final  MRSA Next Gen by PCR, Nasal     Status: None   Collection Time: 07/19/21  5:02 PM   Specimen: Nasal Mucosa; Nasal Swab  Result Value Ref Range Status   MRSA by PCR Next Gen NOT DETECTED NOT DETECTED Final    Comment: (NOTE) The GeneXpert MRSA Assay (FDA approved for NASAL specimens only), is one component of a comprehensive MRSA colonization surveillance program. It is not intended to diagnose MRSA infection nor to guide or monitor treatment for MRSA infections. Test performance is not FDA approved in patients less than 23 years old. Performed at [facility], [place] [address], [place], [place] [address]   Expectorated Sputum Assessment w Gram Stain, Rflx to Resp Cult      Status: None   Collection Time: 07/20/21 10:51 AM   Specimen: Sputum  Result Value Ref Range Status   Specimen Description SPUTUM  Final   Special Requests NONE  Final   Sputum evaluation   Final    THIS SPECIMEN IS ACCEPTABLE FOR SPUTUM CULTURE Performed at [facility], [place] [address] [place], [place] [address]    Report Status 07/20/2021 FINAL  Final  Culture, Respiratory w Gram Stain     Status: None   Collection Time: 07/20/21 10:51 AM   Specimen: SPU  Result Value Ref Range Status   Specimen Description   Final    SPUTUM Performed at [facility] [address], [place], [place] [address]    Special Requests   Final    NONE Reflexed from [phone] Performed at [facility], [place] [address], [place], [place] [address]    Gram Stain   Final    MODERATE WBC PRESENT,BOTH PMN AND MONONUCLEAR RARE GRAM POSITIVE RODS RARE YEAST    Culture   Final    RARE Normal respiratory flora-no Staph aureus or Pseudomonas seen Performed at [facility], [address], [place],  [address]  Report Status 07/23/2021 FINAL  Final     Labs: BNP (last 3 results) No results for input(s): BNP in the last 8760 hours. Basic Metabolic Panel: Recent Labs  Lab 07/19/21 1417 07/19/21 1720 07/20/21 0546 07/21/21 0553 07/22/21 0507 07/24/21 0606  NA 135  --  132* 131* 138 138  K 3.8  --  3.6 4.0 4.7 3.9  CL 97*  --  94* 94* 100 96*  CO2 29  --  28 30 32 35*  GLUCOSE 126*  --  135* 107* 95 94  BUN 9  --  '17 18 12 10  '$ CREATININE 1.00 1.05 0.98 0.93 0.83 0.97  CALCIUM 9.1  --  8.7* 8.5* 8.8* 8.6*  PHOS  --   --  3.5  --   --   --    Liver Function Tests: Recent Labs  Lab 07/19/21 1417 07/20/21 0546  AST 18 14*  ALT 16 14  ALKPHOS 100 75  BILITOT 1.1 0.7  PROT 7.3 5.8*  ALBUMIN 3.9 3.1*   No results for input(s): LIPASE, AMYLASE in the last 168 hours. No results for input(s): AMMONIA in the last 168  hours. CBC: Recent Labs  Lab 07/19/21 1417 07/19/21 1720 07/20/21 0546 07/21/21 0553 07/22/21 0507 07/24/21 0606  WBC 19.4* 20.1* 13.4* 15.3* 11.6* 8.3  NEUTROABS 18.0*  --   --   --   --   --   HGB 15.9 14.6 13.1 12.5* 13.3 12.4*  HCT 45.8 42.4 37.1* 37.1* 38.8* 36.2*  MCV 96.6 97.5 95.9 97.1 99.5 97.3  PLT 261 258 229 262 271 266   Cardiac Enzymes: No results for input(s): CKTOTAL, CKMB, CKMBINDEX, TROPONINI in the last 168 hours. BNP: Invalid input(s): POCBNP CBG: No results for input(s): GLUCAP in the last 168 hours. D-Dimer No results for input(s): DDIMER in the last 72 hours. Hgb A1c No results for input(s): HGBA1C in the last 72 hours. Lipid Profile No results for input(s): CHOL, HDL, LDLCALC, TRIG, CHOLHDL, LDLDIRECT in the last 72 hours. Thyroid function studies No results for input(s): TSH, T4TOTAL, T3FREE, THYROIDAB in the last 72 hours.  Invalid input(s): FREET3 Anemia work up No results for input(s): VITAMINB12, FOLATE, FERRITIN, TIBC, IRON, RETICCTPCT in the last 72 hours. Urinalysis    Component Value Date/Time   COLORURINE YELLOW 03/20/2015 1507   APPEARANCEUR CLEAR 03/20/2015 1507   APPEARANCEUR Clear 04/03/2014 1039   LABSPEC 1.020 03/20/2015 1507   LABSPEC 1.033 04/03/2014 1039   PHURINE 6.0 03/20/2015 1507   GLUCOSEU NEGATIVE 03/20/2015 1507   GLUCOSEU Negative 04/03/2014 1039   HGBUR NEGATIVE 03/20/2015 1507   BILIRUBINUR NEGATIVE 03/20/2015 1507   BILIRUBINUR Negative 04/03/2014 1039   KETONESUR NEGATIVE 03/20/2015 1507   PROTEINUR NEGATIVE 03/20/2015 1507   UROBILINOGEN 0.2 04/02/2014 1355   NITRITE NEGATIVE 03/20/2015 1507   LEUKOCYTESUR NEGATIVE 03/20/2015 1507   LEUKOCYTESUR Negative 04/03/2014 1039   Sepsis Labs Invalid input(s): PROCALCITONIN,  WBC,  LACTICIDVEN Microbiology Recent Results (from the past 240 hour(s))  SARS Coronavirus 2 by RT PCR (hospital order, performed in [facility] hospital lab) *cepheid single result test*      Status: None   Collection Time: 07/19/21  2:18 PM   Specimen: Nasal Swab  Result Value Ref Range Status   SARS Coronavirus 2 by RT PCR NEGATIVE NEGATIVE Final    Comment: (NOTE) SARS-CoV-2 target nucleic acids are NOT DETECTED.  The SARS-CoV-2 RNA is generally detectable in upper and lower respiratory specimens during the acute phase of  infection. The lowest concentration of SARS-CoV-2 viral copies this assay can detect is 250 copies / mL. A negative result does not preclude SARS-CoV-2 infection and should not be used as the sole basis for treatment or other patient management decisions.  A negative result may occur with improper specimen collection / handling, submission of specimen other than nasopharyngeal swab, presence of viral mutation(s) within the areas targeted by this assay, and inadequate number of viral copies (<250 copies / mL). A negative result must be combined with clinical observations, patient history, and epidemiological information.  Fact Sheet for Patients:   https://www.patel.info/  Fact Sheet for Healthcare Providers: https://hall.com/  This test is not yet approved or  cleared by the [place] FDA and has been authorized for detection and/or diagnosis of SARS-CoV-2 by FDA under an Emergency Use Authorization (EUA).  This EUA will remain in effect (meaning this test can be used) for the duration of the COVID-19 declaration under Section 564(b)(1) of the Act, 21 U.S.C. section 360bbb-3(b)(1), unless the authorization is terminated or revoked sooner.  Performed at [facility], [place] [address] [place], [place] [address]   Blood culture (routine single)     Status: None   Collection Time: 07/19/21  2:40 PM   Specimen: BLOOD  Result Value Ref Range Status   Specimen Description   Final    BLOOD RIGHT ANTECUBITAL Performed at [place] [address]  [place], [place] [address]    Special Requests   Final    BOTTLES DRAWN AEROBIC AND ANAEROBIC Blood Culture adequate volume Performed at [place] [address], [place], [place] [address]    Culture   Final    NO GROWTH 5 DAYS Performed at [facility], [place] [address] [place], [place] [address]    Report Status 07/24/2021 FINAL  Final  MRSA Next Gen by PCR, Nasal     Status: None   Collection Time: 07/19/21  5:02 PM   Specimen: Nasal Mucosa; Nasal Swab  Result Value Ref Range Status   MRSA by PCR Next Gen NOT DETECTED NOT DETECTED Final    Comment: (NOTE) The GeneXpert MRSA Assay (FDA approved for NASAL specimens only), is one component of a comprehensive MRSA colonization surveillance program. It is not intended to diagnose MRSA infection nor to guide or monitor treatment for MRSA infections. Test performance is not FDA approved in patients less than 65 years old. Performed at [facility], [place] [address] [place], [place] [address]   Expectorated Sputum Assessment w Gram Stain, Rflx to Resp Cult     Status: None   Collection Time: 07/20/21 10:51 AM   Specimen: Sputum  Result Value Ref Range Status   Specimen Description SPUTUM  Final   Special Requests NONE  Final   Sputum evaluation   Final    THIS SPECIMEN IS ACCEPTABLE FOR SPUTUM CULTURE Performed at [facility], [place] [address], [place], [place] [address]    Report Status 07/20/2021 FINAL  Final  Culture, Respiratory w Gram Stain     Status: None   Collection Time: 07/20/21 10:51 AM   Specimen: SPU  Result Value Ref Range Status   Specimen Description   Final    SPUTUM Performed at [place] [address], [place], [place] [address]    Special Requests   Final    NONE Reflexed from [id number] Performed at [facility], [address] [place], [place] [address]    Gram Stain   Final    MODERATE WBC PRESENT,BOTH  PMN AND MONONUCLEAR RARE GRAM POSITIVE RODS RARE YEAST    Culture   Final    RARE Normal respiratory flora-no Staph aureus or Pseudomonas seen Performed at [facility], [address], [place], [place] [address]    Report Status 07/23/2021 FINAL  Final    SIGNED:   Charlynne Cousins, MD  [facility] 07/25/2021, 9:46 AM Pager   If 7PM-7AM, please contact night-coverage [email or web address] Password [email or web address]

## 2021-07-25 NOTE — Progress Notes (Signed)
SATURATION QUALIFICATIONS: (This note is used to comply with regulatory documentation for home oxygen)  Patient Saturations on Room Air at Rest = 88%  Patient Saturations on Room Air while Ambulating = 81%  Patient Saturations on 4 Liters of oxygen while Ambulating = 91%

## 2021-08-01 ENCOUNTER — Ambulatory Visit (INDEPENDENT_AMBULATORY_CARE_PROVIDER_SITE_OTHER): Payer: Self-pay | Admitting: Nurse Practitioner

## 2021-08-01 ENCOUNTER — Encounter: Payer: Self-pay | Admitting: Nurse Practitioner

## 2021-08-01 VITALS — BP 110/79 | HR 96 | Temp 99.1°F | Ht 73.5 in | Wt 122.6 lb

## 2021-08-01 DIAGNOSIS — J189 Pneumonia, unspecified organism: Secondary | ICD-10-CM

## 2021-08-01 DIAGNOSIS — R609 Edema, unspecified: Secondary | ICD-10-CM | POA: Insufficient documentation

## 2021-08-01 DIAGNOSIS — Z09 Encounter for follow-up examination after completed treatment for conditions other than malignant neoplasm: Secondary | ICD-10-CM

## 2021-08-01 DIAGNOSIS — J441 Chronic obstructive pulmonary disease with (acute) exacerbation: Secondary | ICD-10-CM

## 2021-08-01 DIAGNOSIS — B370 Candidal stomatitis: Secondary | ICD-10-CM

## 2021-08-01 DIAGNOSIS — F419 Anxiety disorder, unspecified: Secondary | ICD-10-CM

## 2021-08-01 MED ORDER — HYDROXYZINE HCL 25 MG PO TABS: 25.0000 mg | ORAL_TABLET | Freq: Four times a day (QID) | ORAL | 0 refills | Status: DC | PRN

## 2021-08-01 MED ORDER — NYSTATIN 100000 UNIT/ML MT SUSP: 5.0000 mL | Freq: Four times a day (QID) | OROMUCOSAL | 0 refills | Status: DC

## 2021-08-01 MED ORDER — ALBUTEROL SULFATE HFA 108 (90 BASE) MCG/ACT IN AERS: 2.0000 | INHALATION_SPRAY | Freq: Four times a day (QID) | RESPIRATORY_TRACT | 0 refills | Status: AC | PRN

## 2021-08-01 NOTE — Patient Instructions (Signed)
1. Anxiety  - Ambulatory referral to Psychiatry - hydrOXYzine (ATARAX) 25 MG tablet; Take 1 tablet (25 mg total) by mouth every 6 (six) hours as needed for anxiety.  Dispense: 30 tablet; Refill: 0  2. Pneumonia of right upper lobe due to infectious organism  - albuterol (VENTOLIN HFA) 108 (90 Base) MCG/ACT inhaler; Inhale 2 puffs into the lungs every 6 (six) hours as needed for wheezing or shortness of breath.  Dispense: 6.7 g; Refill: 0 - Basic Metabolic Panel - CBC  3. COPD with acute exacerbation ([place]) Follow up with pulmonary  4. Edema, unspecified type  - albuterol (VENTOLIN HFA) 108 (90 Base) MCG/ACT inhaler; Inhale 2 puffs into the lungs every 6 (six) hours as needed for wheezing or shortness of breath.  Dispense: 6.7 g; Refill: 0 - Basic Metabolic Panel - CBC  5. Hospital discharge follow-up   6. Oral thrush  - nystatin (MYCOSTATIN) 100000 UNIT/ML suspension; Take 5 mLs (500,000 Units total) by mouth 4 (four) times daily.  Dispense: 60 mL; Refill: 0  Follow up:  Follow up in 3 months

## 2021-08-01 NOTE — Assessment & Plan Note (Signed)
-   Ambulatory referral to Psychiatry - hydrOXYzine (ATARAX) 25 MG tablet; Take 1 tablet (25 mg total) by mouth every 6 (six) hours as needed for anxiety.  Dispense: 30 tablet; Refill: 0  2. Pneumonia of right upper lobe due to infectious organism  - albuterol (VENTOLIN HFA) 108 (90 Base) MCG/ACT inhaler; Inhale 2 puffs into the lungs every 6 (six) hours as needed for wheezing or shortness of breath.  Dispense: 6.7 g; Refill: 0 - Basic Metabolic Panel - CBC  3. COPD with acute exacerbation ([place]) Follow up with pulmonary  4. Edema, unspecified type  - albuterol (VENTOLIN HFA) 108 (90 Base) MCG/ACT inhaler; Inhale 2 puffs into the lungs every 6 (six) hours as needed for wheezing or shortness of breath.  Dispense: 6.7 g; Refill: 0 - Basic Metabolic Panel - CBC  5. Hospital discharge follow-up   6. Oral thrush  - nystatin (MYCOSTATIN) 100000 UNIT/ML suspension; Take 5 mLs (500,000 Units total) by mouth 4 (four) times daily.  Dispense: 60 mL; Refill: 0  Follow up:  Follow up in 3 months

## 2021-08-01 NOTE — Progress Notes (Signed)
$'@Patient'Z$  ID: Rusty Aus, male    DOB: 09-Sep-1975, 46 y.o.   MRN: [id number]  Chief Complaint  Patient presents with   Establish Care    Patient is here to establish care and discuss his hospital visit. Patient was admitted to the hospital 07/19/21 for pneumonia.  Patient states that his breathing is better but he is very weak and having lower back pains. Patient also states that his younger sister just passed away and he is dealing with that and its a little stressful on him right now.    Referring provider: Fenton Foy, NP  Recent significant events:  Hospital admission: 07/19/21  Acute respiratory failure with hypoxia secondary to community-acquired pneumonia with superimposed acute COPD exacerbation: He was started empirically on IV Rocephin and azithromycin and completed his treatment in house. Culture data remain negative till date. He had hemoptysis on 07/22/2021 chest x-ray showed no infiltrate pulmonary was consulted who recommended a CT angio to rule out a PE which was negative for PE.  It showed right upper lobe pneumonia. Pulmonary recommended to continue steroids Profilnine inhalers given 2 doses and IV Lasix and was weaned to room air. Pulmonary will follow up as an outpatient .   Bilateral lower extremity edema: 2D echo showed normal systolic function elevated pressures. He was given IV Lasix.  We will follow-up with pulmonary as an outpatient.  General anxiety disorder: Discontinue Xanax continue BuSpar as an outpatient.  Hypovolemic hyponatremia: Urine sodium was less than 10 he was started on IV fluids is resolved.  HPI   Acute respiratory failure with hypoxia secondary to community-acquired pneumonia with superimposed acute COPD exacerbation:  Patient has appointment scheduled to follow up with pulmonary   Bilateral lower extremity edema:  Resolved  General anxiety disorder:  Sister passed away 01-Aug-2022 continues on BuSpar as an outpatient.  Will need  BMP/CBC  Denies f/c/s, n/v/d, hemoptysis, PND, leg swelling Denies chest pain or edema      Allergies  Allergen Reactions   Effexor [Venlafaxine] Diarrhea   Hydrocodone-Acetaminophen     REACTION: severe headaches   Ibuprofen Other (See Comments)    Severe stomach issues   Lyrica [Pregabalin] Diarrhea    Swelling in the feet   Neurontin [Gabapentin] Other (See Comments)    Severe stomach issues   Nsaids Other (See Comments)    Severe stomach issues   Tramadol Hcl     REACTION: diarrhea, abdominal pain    Immunization History  Administered Date(s) Administered   PFIZER(Purple Top)SARS-COV-2 Vaccination 05/25/2019, 06/15/2019    Past Medical History:  Diagnosis Date   Chronic back pain    Degenerative disc disease    Emphysema/COPD (HCC)    Opiate abuse, continuous (HCC)    Pneumothorax on left     Tobacco History: Social History   Tobacco Use  Smoking Status Every Day   Packs/day: 0.25   Years: 12.00   Total pack years: 3.00   Types: Cigarettes  Smokeless Tobacco Never   Ready to quit: Not Answered Counseling given: Not Answered   Outpatient Encounter Medications as of 08/01/2021  Medication Sig   acetaminophen (TYLENOL) 500 MG tablet Take 500 mg by mouth every 6 (six) hours as needed for moderate pain.   mometasone-formoterol (DULERA) 200-5 MCG/ACT AERO Inhale 2 puffs into the lungs 2 (two) times daily.   nystatin (MYCOSTATIN) 100000 UNIT/ML suspension Take 5 mLs (500,000 Units total) by mouth 4 (four) times daily.   umeclidinium bromide (INCRUSE ELLIPTA) 62.5 MCG/ACT AEPB  Inhale 1 puff into the lungs daily.   [DISCONTINUED] albuterol (VENTOLIN HFA) 108 (90 Base) MCG/ACT inhaler Inhale 2 puffs into the lungs every 6 (six) hours as needed for wheezing or shortness of breath.   albuterol (VENTOLIN HFA) 108 (90 Base) MCG/ACT inhaler Inhale 2 puffs into the lungs every 6 (six) hours as needed for wheezing or shortness of breath.   hydrOXYzine (ATARAX) 25 MG  tablet Take 1 tablet (25 mg total) by mouth every 6 (six) hours as needed for anxiety.   nicotine polacrilex (NICORETTE) 2 MG gum Take 1 each (2 mg total) by mouth as needed for smoking cessation. (Patient not taking: Reported on 07/19/2021)   traZODone (DESYREL) 150 MG tablet Take 1 tablet (150 mg total) by mouth at bedtime. (Patient not taking: Reported on 07/19/2021)   [DISCONTINUED] busPIRone (BUSPAR) 7.5 MG tablet Take 1 tablet (7.5 mg total) by mouth 2 (two) times daily.   [DISCONTINUED] hydrOXYzine (ATARAX/VISTARIL) 25 MG tablet Take 1 tablet (25 mg total) by mouth every 6 (six) hours as needed for anxiety. (Patient not taking: Reported on 07/19/2021)   [DISCONTINUED] lidocaine (LIDODERM) 5 % Place 1 patch onto the skin daily. Remove & Discard patch within 12 hours or as directed by MD (Patient not taking: Reported on 07/19/2021)   [DISCONTINUED] naproxen (NAPROSYN) 500 MG tablet Take 1 tablet (500 mg total) by mouth 2 (two) times daily with a meal. (Patient not taking: Reported on 07/19/2021)   [DISCONTINUED] predniSONE (DELTASONE) 10 MG tablet Takes 6 tablets for 1 days, then 5 tablets for 1 days, then 4 tablets for 1 days, then 3 tablets for 1 days, then 2 tabs for 1 days, then 1 tab for 1 days, and then stop. (Patient not taking: Reported on 08/01/2021)   No facility-administered encounter medications on file as of 08/01/2021.     Review of Systems  Review of Systems  Constitutional: Negative.   HENT: Negative.    Cardiovascular: Negative.   Gastrointestinal: Negative.   Allergic/Immunologic: Negative.   Neurological: Negative.   Psychiatric/Behavioral: Negative.         Physical Exam  BP 110/79   Pulse 96   Temp 99.1 F (37.3 C)   Ht 6' 1.5" (1.867 m)   Wt 122 lb 9.6 oz (55.6 kg)   SpO2 98%   BMI 15.96 kg/m   Wt Readings from Last 5 Encounters:  08/01/21 122 lb 9.6 oz (55.6 kg)  07/19/21 130 lb (59 kg)  05/26/16 140 lb (63.5 kg)  02/11/16 140 lb (63.5 kg)  01/13/16 140  lb (63.5 kg)     Physical Exam Vitals and nursing note reviewed.  Constitutional:      General: He is not in acute distress.    Appearance: He is well-developed.  Cardiovascular:     Rate and Rhythm: Normal rate and regular rhythm.  Pulmonary:     Effort: Pulmonary effort is normal.     Breath sounds: Normal breath sounds.  Skin:    General: Skin is warm and dry.  Neurological:     Mental Status: He is alert and oriented to person, place, and time.      Lab Results:  CBC    Component Value Date/Time   WBC 8.3 07/24/2021 0606   RBC 3.72 (L) 07/24/2021 0606   HGB 12.4 (L) 07/24/2021 0606   HGB 16.7 04/03/2014 1039   HCT 36.2 (L) 07/24/2021 0606   HCT 50.4 04/03/2014 1039   PLT 266 07/24/2021 0606   PLT  387 04/03/2014 1039   MCV 97.3 07/24/2021 0606   MCV 95 04/03/2014 1039   MCH 33.3 07/24/2021 0606   MCHC 34.3 07/24/2021 0606   RDW 12.6 07/24/2021 0606   RDW 14.0 04/03/2014 1039   LYMPHSABS 0.4 (L) 07/19/2021 1417   LYMPHSABS 1.2 04/03/2014 1039   MONOABS 1.0 07/19/2021 1417   MONOABS 0.6 04/03/2014 1039   EOSABS 0.0 07/19/2021 1417   EOSABS 0.0 04/03/2014 1039   BASOSABS 0.0 07/19/2021 1417   BASOSABS 0.1 04/03/2014 1039    BMET    Component Value Date/Time   NA 138 07/24/2021 0606   NA 142 04/03/2014 1039   K 3.9 07/24/2021 0606   K 3.8 04/03/2014 1039   CL 96 (L) 07/24/2021 0606   CL 106 04/03/2014 1039   CO2 35 (H) 07/24/2021 0606   CO2 28 04/03/2014 1039   GLUCOSE 94 07/24/2021 0606   GLUCOSE 121 (H) 04/03/2014 1039   BUN 10 07/24/2021 0606   BUN 11 04/03/2014 1039   CREATININE 0.97 07/24/2021 0606   CREATININE 1.14 04/03/2014 1039   CALCIUM 8.6 (L) 07/24/2021 0606   CALCIUM 9.4 04/03/2014 1039   GFRNONAA >60 07/24/2021 0606   GFRNONAA >60 04/03/2014 1039   GFRAA >60 05/26/2016 2343   GFRAA >60 04/03/2014 1039    BNP No results found for: "BNP"  ProBNP No results found for: "PROBNP"  Imaging: CT Angio Chest Pulmonary Embolism (PE)  W or WO Contrast  Result Date: 07/23/2021 CLINICAL DATA:  Right-sided pleuritic chest pain. Shortness of breath and productive cough. EXAM: CT ANGIOGRAPHY CHEST WITH CONTRAST TECHNIQUE: Multidetector CT imaging of the chest was performed using the standard protocol during bolus administration of intravenous contrast. Multiplanar CT image reconstructions and MIPs were obtained to evaluate the vascular anatomy. RADIATION DOSE REDUCTION: This exam was performed according to the departmental dose-optimization program which includes automated exposure control, adjustment of the mA and/or kV according to patient size and/or use of iterative reconstruction technique. CONTRAST:  71m OMNIPAQUE IOHEXOL 350 MG/ML SOLN COMPARISON:  Chest CT 07/19/2021 FINDINGS: Cardiovascular: The heart is normal in size. No pericardial effusion. The aorta is normal in caliber. No dissection or atherosclerotic calcification. Branch vessels are patent. No obvious coronary artery calcifications. The pulmonary arterial tree is well opacified. No filling defects to suggest pulmonary embolism. Mediastinum/Nodes: No mediastinal or hilar mass or lymphadenopathy. The esophagus is grossly normal. Lungs/Pleura: Stable severe bullous emphysema. Persistent but improving right upper lobe pneumonia. Very small right pleural effusion. No pneumothorax. No worrisome pulmonary lesions. Upper Abdomen: No significant upper abdominal findings. Musculoskeletal: No significant bony findings. Review of the MIP images confirms the above findings. IMPRESSION: 1. No CT findings for pulmonary embolism. 2. Normal thoracic aorta. 3. Persistent but improving right upper lobe pneumonia. 4. Stable severe bullous emphysema. 5. Very small right pleural effusion. Aortic Atherosclerosis (ICD10-I70.0) and Emphysema (ICD10-J43.9). Electronically Signed   By: PMarijo SanesM.D.   On: 07/23/2021 12:13   DG CHEST PORT 1 VIEW  Result Date: 07/22/2021 CLINICAL DATA:  Cough and  shortness of breath beginning this morning. Emphysema. EXAM: PORTABLE CHEST 1 VIEW COMPARISON:  07/19/2021 FINDINGS: The heart size and mediastinal contours are within normal limits. Severe bullous emphysema again demonstrated. Both lungs are clear. No evidence of pneumothorax or pleural effusion. IMPRESSION: Severe bullous emphysema. No active lung disease. Electronically Signed   By: JMarlaine HindM.D.   On: 07/22/2021 10:45   ECHOCARDIOGRAM COMPLETE  Result Date: 07/22/2021    ECHOCARDIOGRAM REPORT  Patient Name:   SHANNAN GARFINKEL Date of Exam: 07/22/2021 Medical Rec #:  [id number]      Height:       73.0 in Accession #:    [id number]     Weight:       130.0 lb Date of Birth:  Jun 01, 1975      BSA:          1.792 m Patient Age:    40 years       BP:           118/85 mmHg Patient Gender: M              HR:           111 bpm. Exam Location:  Inpatient Procedure: 2D Echo, Cardiac Doppler and Color Doppler Indications:    Dyspnea R06.00  History:        Patient has no prior history of Echocardiogram examinations.                 Risk Factors:Current Smoker.  Sonographer:    Darlina Sicilian RDCS Referring Phys: [id number]  Sonographer Comments: Image acquisition challenging due to COPD. IMPRESSIONS  1. Left ventricular ejection fraction, by estimation, is 55 to 60%. The left ventricle has normal function. The left ventricle has no regional wall motion abnormalities. Left ventricular diastolic parameters were normal.  2. Right ventricular systolic function is normal. The right ventricular size is normal. There is severely elevated pulmonary artery systolic pressure.  3. The mitral valve is normal in structure. Trivial mitral valve regurgitation. No evidence of mitral stenosis.  4. The aortic valve is tricuspid. Aortic valve regurgitation is not visualized. No aortic stenosis is present.  5. The inferior vena cava is dilated in size with >50% respiratory variability, suggesting right atrial pressure of 8 mmHg.  Comparison(s): No prior Echocardiogram. FINDINGS  Left Ventricle: Left ventricular ejection fraction, by estimation, is 55 to 60%. The left ventricle has normal function. The left ventricle has no regional wall motion abnormalities. The left ventricular internal cavity size was normal in size. There is  no left ventricular hypertrophy. Left ventricular diastolic parameters were normal. Right Ventricle: The right ventricular size is normal. Right ventricular systolic function is normal. There is severely elevated pulmonary artery systolic pressure. The tricuspid regurgitant velocity is 3.69 m/s, and with an assumed right atrial pressure  of 8 mmHg, the estimated right ventricular systolic pressure is 14.9 mmHg. Left Atrium: Left atrial size was normal in size. Right Atrium: Right atrial size was normal in size. Pericardium: Trivial pericardial effusion is present. Mitral Valve: The mitral valve is normal in structure. Trivial mitral valve regurgitation. No evidence of mitral valve stenosis. Tricuspid Valve: The tricuspid valve is normal in structure. Tricuspid valve regurgitation is mild . No evidence of tricuspid stenosis. Aortic Valve: The aortic valve is tricuspid. Aortic valve regurgitation is not visualized. No aortic stenosis is present. Pulmonic Valve: The pulmonic valve was grossly normal. Pulmonic valve regurgitation is not visualized. No evidence of pulmonic stenosis. Aorta: The aortic root is normal in size and structure. Venous: The inferior vena cava is dilated in size with greater than 50% respiratory variability, suggesting right atrial pressure of 8 mmHg. IAS/Shunts: No atrial level shunt detected by color flow Doppler.  LEFT VENTRICLE PLAX 2D LVIDd:         4.30 cm LVIDs:         2.40 cm LV PW:  0.70 cm LV IVS:        0.60 cm LVOT diam:     2.20 cm LV SV:         52 LV SV Index:   29 LVOT Area:     3.80 cm  RIGHT VENTRICLE RV S prime:     16.70 cm/s TAPSE (M-mode): 2.3 cm LEFT ATRIUM              Index LA diam:        3.40 cm 1.90 cm/m LA Vol (A2C):   26.6 ml 14.85 ml/m LA Vol (A4C):   33.2 ml 18.53 ml/m LA Biplane Vol: 30.7 ml 17.13 ml/m  AORTIC VALVE LVOT Vmax:   80.00 cm/s LVOT Vmean:  44.700 cm/s LVOT VTI:    0.137 m  AORTA Ao Root diam: 3.40 cm Ao Asc diam:  2.80 cm TRICUSPID VALVE TR Peak grad:   54.5 mmHg TR Vmax:        369.00 cm/s  SHUNTS Systemic VTI:  0.14 m Systemic Diam: 2.20 cm Kirk Ruths MD Electronically signed by Kirk Ruths MD Signature Date/Time: 07/22/2021/8:31:47 AM    Final    CT HEAD WO CONTRAST (5MM)  Result Date: 07/20/2021 CLINICAL DATA:  Headache, chronic, with new features or increased frequency. EXAM: CT HEAD WITHOUT CONTRAST TECHNIQUE: Contiguous axial images were obtained from the base of the skull through the vertex without intravenous contrast. RADIATION DOSE REDUCTION: This exam was performed according to the departmental dose-optimization program which includes automated exposure control, adjustment of the mA and/or kV according to patient size and/or use of iterative reconstruction technique. COMPARISON:  None Available. FINDINGS: Brain: No evidence of acute infarction, hemorrhage, hydrocephalus, extra-axial collection or mass lesion/mass effect. Vascular: No hyperdense vessel or unexpected calcification. Skull: Normal. Negative for fracture or focal lesion. Sinuses/Orbits: No acute finding. IMPRESSION: Negative head CT. Electronically Signed   By: Jorje Guild M.D.   On: 07/20/2021 10:30   CT Chest Wo Contrast  Result Date: 07/19/2021 CLINICAL DATA:  Shortness of breath, right chest pain, pneumonia EXAM: CT CHEST WITHOUT CONTRAST TECHNIQUE: Multidetector CT imaging of the chest was performed following the standard protocol without IV contrast. RADIATION DOSE REDUCTION: This exam was performed according to the departmental dose-optimization program which includes automated exposure control, adjustment of the mA and/or kV according to patient size  and/or use of iterative reconstruction technique. COMPARISON:  Previous studies including chest radiograph done earlier today and CT done on 05/27/2016 FINDINGS: Cardiovascular: There is ectasia of main pulmonary artery measuring 3.6 cm suggesting pulmonary arterial hypertension. Mediastinum/Nodes: No significant lymphadenopathy seen. Lungs/Pleura: Severe bullous emphysema is seen in both lungs especially in the right upper lung fields and left lower lung fields. There is new patchy alveolar infiltrate in the anterior segment of right upper lobe. There is subtle increase in interstitial markings in the lateral aspect of right mid lung fields. Rest of the lung fields show no new infiltrates. There is no pleural effusion or pneumothorax. Upper Abdomen: Unremarkable. Musculoskeletal: Unremarkable. IMPRESSION: Severe COPD with bullous emphysema. There is new alveolar and interstitial infiltrate in the anterior segment of right upper lobe consistent with pneumonia. There is no pleural effusion or pneumothorax. Electronically Signed   By: Elmer Picker M.D.   On: 07/19/2021 15:20   DG Chest Portable 1 View  Result Date: 07/19/2021 CLINICAL DATA:  Cough, shortness of breath EXAM: PORTABLE CHEST 1 VIEW COMPARISON:  Previous studies including the examination of 05/26/2016 FINDINGS: Cardiac size is within  normal limits. Low position of diaphragms suggests COPD. Bullous emphysema is seen in both lungs, more so in the right upper and left lower lung fields. Linear densities in the right parahilar region have not changed significantly. There are no signs of alveolar pulmonary edema or new focal consolidation. There is interval increase in interstitial markings in the right lower lung fields. There is no pleural effusion or pneumothorax. IMPRESSION: Severe COPD with bullous emphysema. There is no focal pulmonary consolidation. There is slight increase in interstitial markings in the right lower lung fields which may be  due to crowding of bronchovascular structures or interstitial pneumonia. Electronically Signed   By: Elmer Picker M.D.   On: 07/19/2021 14:33     Assessment & Plan:   Anxiety - Ambulatory referral to Psychiatry - hydrOXYzine (ATARAX) 25 MG tablet; Take 1 tablet (25 mg total) by mouth every 6 (six) hours as needed for anxiety.  Dispense: 30 tablet; Refill: 0  2. Pneumonia of right upper lobe due to infectious organism  - albuterol (VENTOLIN HFA) 108 (90 Base) MCG/ACT inhaler; Inhale 2 puffs into the lungs every 6 (six) hours as needed for wheezing or shortness of breath.  Dispense: 6.7 g; Refill: 0 - Basic Metabolic Panel - CBC  3. COPD with acute exacerbation ([place]) Follow up with pulmonary  4. Edema, unspecified type  - albuterol (VENTOLIN HFA) 108 (90 Base) MCG/ACT inhaler; Inhale 2 puffs into the lungs every 6 (six) hours as needed for wheezing or shortness of breath.  Dispense: 6.7 g; Refill: 0 - Basic Metabolic Panel - CBC  5. Hospital discharge follow-up   6. Oral thrush  - nystatin (MYCOSTATIN) 100000 UNIT/ML suspension; Take 5 mLs (500,000 Units total) by mouth 4 (four) times daily.  Dispense: 60 mL; Refill: 0  Follow up:  Follow up in 3 months     Fenton Foy, NP 08/01/2021

## 2021-08-02 ENCOUNTER — Telehealth: Payer: Self-pay | Admitting: Primary Care

## 2021-08-02 ENCOUNTER — Encounter: Payer: Self-pay | Admitting: Primary Care

## 2021-08-02 ENCOUNTER — Ambulatory Visit (INDEPENDENT_AMBULATORY_CARE_PROVIDER_SITE_OTHER): Payer: Self-pay | Admitting: Primary Care

## 2021-08-02 DIAGNOSIS — J189 Pneumonia, unspecified organism: Secondary | ICD-10-CM

## 2021-08-02 DIAGNOSIS — J441 Chronic obstructive pulmonary disease with (acute) exacerbation: Secondary | ICD-10-CM

## 2021-08-02 LAB — CBC
Hematocrit: 35.9 % — ABNORMAL LOW (ref 37.5–51.0)
Hemoglobin: 12.4 g/dL — ABNORMAL LOW (ref 13.0–17.7)
MCH: 32.7 pg (ref 26.6–33.0)
MCHC: 34.5 g/dL (ref 31.5–35.7)
MCV: 95 fL (ref 79–97)
Platelets: 420 10*3/uL (ref 150–450)
RBC: 3.79 x10E6/uL — ABNORMAL LOW (ref 4.14–5.80)
RDW: 12.7 % (ref 11.6–15.4)
WBC: 18 10*3/uL — ABNORMAL HIGH (ref 3.4–10.8)

## 2021-08-02 LAB — BASIC METABOLIC PANEL
BUN/Creatinine Ratio: 10 (ref 9–20)
BUN: 12 mg/dL (ref 6–24)
CO2: 26 mmol/L (ref 20–29)
Calcium: 8.6 mg/dL — ABNORMAL LOW (ref 8.7–10.2)
Chloride: 101 mmol/L (ref 96–106)
Creatinine, Ser: 1.23 mg/dL (ref 0.76–1.27)
Glucose: 78 mg/dL (ref 70–99)
Potassium: 4.1 mmol/L (ref 3.5–5.2)
Sodium: 141 mmol/L (ref 134–144)
eGFR: 72 mL/min/{1.73_m2} (ref >59)

## 2021-08-02 MED ORDER — TRELEGY ELLIPTA 200-62.5-25 MCG/ACT IN AEPB: 1.0000 | INHALATION_SPRAY | Freq: Every day | RESPIRATORY_TRACT | 0 refills | Status: DC

## 2021-08-02 NOTE — Telephone Encounter (Signed)
Can you do benefits investigation for Brett Beck. Or other options for triple therapy coverage. He received 1 month supply through [facility] program. He is currently self pay but tells me he was recently approved for medicaid.

## 2021-08-02 NOTE — Patient Instructions (Addendum)
Recommendations: - Continue Dulera 28mg two puffs morning and evening  - Continue Incruse one puff daily in the morning - Use albuterol rescue inhaler 2 puffs every 4-6 hours as needed for breakthrough shortness of breath/wheezing - I have reach out to our pharmacy team about coverage for the above inhalers. I have given you a sample of Trelegy to use only if you run out of Dulera/Incruse inhalers (take one puff once daily in the morning. Do not use while taking either Dulera or Incruse as you would be doubling up on some of the medication) - We should get repeat imaging in 4-6 weeks, I have reached out to our patient care coordinator about cost   Follow-up: 4-6 weeks with Dr. MVerlee Monte(15 min slot ok, seen in hosp for CAP)

## 2021-08-02 NOTE — Progress Notes (Unsigned)
$'@Patient'Q$  ID: Brett Beck, male    DOB: [birth year], 46 y.o.   MRN: [id number]  No chief complaint on file.   Referring provider: No ref. provider found  HPI: 46 year old male, current every day smoker.  Past medical history significant for COPD, bullous emphysema, pneumonia, substance abuse, major depressive disorder, tobacco abuse.  Hospital course 07/19/21-07/25/21 Discharge Diagnoses:  Principal Problem:   PNA (pneumonia) Active Problems:   Depression   Emphysema lung (HCC)   COPD with acute exacerbation ([place])   Tobacco abuse   Protein-calorie malnutrition, severe (HCC)   Acute respiratory failure with hypoxia secondary to community-acquired pneumonia with superimposed acute COPD exacerbation: He was started empirically on IV Rocephin and azithromycin and completed his treatment in house. Culture data remain negative till date. He had hemoptysis on 07/22/2021 chest x-ray showed no infiltrate pulmonary was consulted who recommended a CT angio to rule out a PE which was negative for PE.  It showed right upper lobe pneumonia. Pulmonary recommended to continue steroids Profilnine inhalers given 2 doses and IV Lasix and was weaned to room air. Pulmonary will follow up as an outpatient .   Bilateral lower extremity edema: 2D echo showed normal systolic function elevated pressures. He was given IV Lasix.  We will follow-up with pulmonary as an outpatient.  08/02/2021- Hospital fu  Patient presents today for hospital follow-up. Admitted from 07/19/21-07/25/21 for CAP. Consulted in patient by Dr. Irineo Axon.  CTA negative for PE, showed right upper lobe pneumonia. Pulmonary recommended continued steriods  He is feeling significantly better. Breathing has improved. He has some residual weakness.  He is newly on dulera and incruse. Prescriptions were filled by Match program and he received 1 month supply. He had previously been on symbicort several years ago. It was costing him over 300. Sent in  albuterol and nystatin yesterday  Approved for [facility]   Allergies  Allergen Reactions   Effexor [Venlafaxine] Diarrhea   Hydrocodone-Acetaminophen     REACTION: severe headaches   Ibuprofen Other (See Comments)    Severe stomach issues   Lyrica [Pregabalin] Diarrhea    Swelling in the feet   Neurontin [Gabapentin] Other (See Comments)    Severe stomach issues   Nsaids Other (See Comments)    Severe stomach issues   Tramadol Hcl     REACTION: diarrhea, abdominal pain    Immunization History  Administered Date(s) Administered   PFIZER(Purple Top)SARS-COV-2 Vaccination 05/25/2019, 06/15/2019    Past Medical History:  Diagnosis Date   Chronic back pain    Degenerative disc disease    Emphysema/COPD (HCC)    Opiate abuse, continuous (HCC)    Pneumothorax on left     Tobacco History: Social History   Tobacco Use  Smoking Status Every Day   Packs/day: 0.25   Years: 12.00   Total pack years: 3.00   Types: Cigarettes  Smokeless Tobacco Never   Ready to quit: Not Answered Counseling given: Not Answered   Outpatient Medications Prior to Visit  Medication Sig Dispense Refill   acetaminophen (TYLENOL) 500 MG tablet Take 500 mg by mouth every 6 (six) hours as needed for moderate pain.     albuterol (VENTOLIN HFA) 108 (90 Base) MCG/ACT inhaler Inhale 2 puffs into the lungs every 6 (six) hours as needed for wheezing or shortness of breath. 6.7 g 0   hydrOXYzine (ATARAX) 25 MG tablet Take 1 tablet (25 mg total) by mouth every 6 (six) hours as needed for anxiety. 30 tablet 0  mometasone-formoterol (DULERA) 200-5 MCG/ACT AERO Inhale 2 puffs into the lungs 2 (two) times daily. 1 each 3   nicotine polacrilex (NICORETTE) 2 MG gum Take 1 each (2 mg total) by mouth as needed for smoking cessation. (Patient not taking: Reported on 07/19/2021) 100 tablet 0   nystatin (MYCOSTATIN) 100000 UNIT/ML suspension Take 5 mLs (500,000 Units total) by mouth 4 (four) times daily. 60 mL 0    traZODone (DESYREL) 150 MG tablet Take 1 tablet (150 mg total) by mouth at bedtime. (Patient not taking: Reported on 07/19/2021) 30 tablet 0   umeclidinium bromide (INCRUSE ELLIPTA) 62.5 MCG/ACT AEPB Inhale 1 puff into the lungs daily. 30 each 3   No facility-administered medications prior to visit.      Review of Systems  Review of Systems   Physical Exam  There were no vitals taken for this visit. Physical Exam   Lab Results:  CBC    Component Value Date/Time   WBC 18.0 (H) 08/01/2021 1536   WBC 8.3 07/24/2021 0606   RBC 3.79 (L) 08/01/2021 1536   RBC 3.72 (L) 07/24/2021 0606   HGB 12.4 (L) 08/01/2021 1536   HCT 35.9 (L) 08/01/2021 1536   PLT 420 08/01/2021 1536   MCV 95 08/01/2021 1536   MCV 95 04/03/2014 1039   MCH 32.7 08/01/2021 1536   MCH 33.3 07/24/2021 0606   MCHC 34.5 08/01/2021 1536   MCHC 34.3 07/24/2021 0606   RDW 12.7 08/01/2021 1536   RDW 14.0 04/03/2014 1039   LYMPHSABS 0.4 (L) 07/19/2021 1417   LYMPHSABS 1.2 04/03/2014 1039   MONOABS 1.0 07/19/2021 1417   MONOABS 0.6 04/03/2014 1039   EOSABS 0.0 07/19/2021 1417   EOSABS 0.0 04/03/2014 1039   BASOSABS 0.0 07/19/2021 1417   BASOSABS 0.1 04/03/2014 1039    BMET    Component Value Date/Time   NA 141 08/01/2021 1536   NA 142 04/03/2014 1039   K 4.1 08/01/2021 1536   K 3.8 04/03/2014 1039   CL 101 08/01/2021 1536   CL 106 04/03/2014 1039   CO2 26 08/01/2021 1536   CO2 28 04/03/2014 1039   GLUCOSE 78 08/01/2021 1536   GLUCOSE 94 07/24/2021 0606   GLUCOSE 121 (H) 04/03/2014 1039   BUN 12 08/01/2021 1536   BUN 11 04/03/2014 1039   CREATININE 1.23 08/01/2021 1536   CREATININE 1.14 04/03/2014 1039   CALCIUM 8.6 (L) 08/01/2021 1536   CALCIUM 9.4 04/03/2014 1039   GFRNONAA >60 07/24/2021 0606   GFRNONAA >60 04/03/2014 1039   GFRAA >60 05/26/2016 2343   GFRAA >60 04/03/2014 1039    BNP No results found for: "BNP"  ProBNP No results found for: "PROBNP"  Imaging: CT Angio Chest  Pulmonary Embolism (PE) W or WO Contrast  Result Date: 07/23/2021 CLINICAL DATA:  Right-sided pleuritic chest pain. Shortness of breath and productive cough. EXAM: CT ANGIOGRAPHY CHEST WITH CONTRAST TECHNIQUE: Multidetector CT imaging of the chest was performed using the standard protocol during bolus administration of intravenous contrast. Multiplanar CT image reconstructions and MIPs were obtained to evaluate the vascular anatomy. RADIATION DOSE REDUCTION: This exam was performed according to the departmental dose-optimization program which includes automated exposure control, adjustment of the mA and/or kV according to patient size and/or use of iterative reconstruction technique. CONTRAST:  80m OMNIPAQUE IOHEXOL 350 MG/ML SOLN COMPARISON:  Chest CT 07/19/2021 FINDINGS: Cardiovascular: The heart is normal in size. No pericardial effusion. The aorta is normal in caliber. No dissection or atherosclerotic calcification. Branch vessels  are patent. No obvious coronary artery calcifications. The pulmonary arterial tree is well opacified. No filling defects to suggest pulmonary embolism. Mediastinum/Nodes: No mediastinal or hilar mass or lymphadenopathy. The esophagus is grossly normal. Lungs/Pleura: Stable severe bullous emphysema. Persistent but improving right upper lobe pneumonia. Very small right pleural effusion. No pneumothorax. No worrisome pulmonary lesions. Upper Abdomen: No significant upper abdominal findings. Musculoskeletal: No significant bony findings. Review of the MIP images confirms the above findings. IMPRESSION: 1. No CT findings for pulmonary embolism. 2. Normal thoracic aorta. 3. Persistent but improving right upper lobe pneumonia. 4. Stable severe bullous emphysema. 5. Very small right pleural effusion. Aortic Atherosclerosis (ICD10-I70.0) and Emphysema (ICD10-J43.9). Electronically Signed   By: Marijo Sanes M.D.   On: 07/23/2021 12:13   DG CHEST PORT 1 VIEW  Result Date:  07/22/2021 CLINICAL DATA:  Cough and shortness of breath beginning this morning. Emphysema. EXAM: PORTABLE CHEST 1 VIEW COMPARISON:  07/19/2021 FINDINGS: The heart size and mediastinal contours are within normal limits. Severe bullous emphysema again demonstrated. Both lungs are clear. No evidence of pneumothorax or pleural effusion. IMPRESSION: Severe bullous emphysema. No active lung disease. Electronically Signed   By: Marlaine Hind M.D.   On: 07/22/2021 10:45   ECHOCARDIOGRAM COMPLETE  Result Date: 07/22/2021    ECHOCARDIOGRAM REPORT   Patient Name:   CASPIAN DELEONARDIS Date of Exam: 07/22/2021 Medical Rec #:  [id number]      Height:       73.0 in Accession #:    [id number]     Weight:       130.0 lb Date of Birth:  Feb 28, 1975      BSA:          1.792 m Patient Age:    42 years       BP:           118/85 mmHg Patient Gender: M              HR:           111 bpm. Exam Location:  Inpatient Procedure: 2D Echo, Cardiac Doppler and Color Doppler Indications:    Dyspnea R06.00  History:        Patient has no prior history of Echocardiogram examinations.                 Risk Factors:Current Smoker.  Sonographer:    Darlina Sicilian RDCS Referring Phys: [id number]  Sonographer Comments: Image acquisition challenging due to COPD. IMPRESSIONS  1. Left ventricular ejection fraction, by estimation, is 55 to 60%. The left ventricle has normal function. The left ventricle has no regional wall motion abnormalities. Left ventricular diastolic parameters were normal.  2. Right ventricular systolic function is normal. The right ventricular size is normal. There is severely elevated pulmonary artery systolic pressure.  3. The mitral valve is normal in structure. Trivial mitral valve regurgitation. No evidence of mitral stenosis.  4. The aortic valve is tricuspid. Aortic valve regurgitation is not visualized. No aortic stenosis is present.  5. The inferior vena cava is dilated in size with >50% respiratory variability,  suggesting right atrial pressure of 8 mmHg. Comparison(s): No prior Echocardiogram. FINDINGS  Left Ventricle: Left ventricular ejection fraction, by estimation, is 55 to 60%. The left ventricle has normal function. The left ventricle has no regional wall motion abnormalities. The left ventricular internal cavity size was normal in size. There is  no left ventricular hypertrophy. Left ventricular diastolic parameters were normal. Right Ventricle: The  right ventricular size is normal. Right ventricular systolic function is normal. There is severely elevated pulmonary artery systolic pressure. The tricuspid regurgitant velocity is 3.69 m/s, and with an assumed right atrial pressure  of 8 mmHg, the estimated right ventricular systolic pressure is 62.7 mmHg. Left Atrium: Left atrial size was normal in size. Right Atrium: Right atrial size was normal in size. Pericardium: Trivial pericardial effusion is present. Mitral Valve: The mitral valve is normal in structure. Trivial mitral valve regurgitation. No evidence of mitral valve stenosis. Tricuspid Valve: The tricuspid valve is normal in structure. Tricuspid valve regurgitation is mild . No evidence of tricuspid stenosis. Aortic Valve: The aortic valve is tricuspid. Aortic valve regurgitation is not visualized. No aortic stenosis is present. Pulmonic Valve: The pulmonic valve was grossly normal. Pulmonic valve regurgitation is not visualized. No evidence of pulmonic stenosis. Aorta: The aortic root is normal in size and structure. Venous: The inferior vena cava is dilated in size with greater than 50% respiratory variability, suggesting right atrial pressure of 8 mmHg. IAS/Shunts: No atrial level shunt detected by color flow Doppler.  LEFT VENTRICLE PLAX 2D LVIDd:         4.30 cm LVIDs:         2.40 cm LV PW:         0.70 cm LV IVS:        0.60 cm LVOT diam:     2.20 cm LV SV:         52 LV SV Index:   29 LVOT Area:     3.80 cm  RIGHT VENTRICLE RV S prime:     16.70 cm/s  TAPSE (M-mode): 2.3 cm LEFT ATRIUM             Index LA diam:        3.40 cm 1.90 cm/m LA Vol (A2C):   26.6 ml 14.85 ml/m LA Vol (A4C):   33.2 ml 18.53 ml/m LA Biplane Vol: 30.7 ml 17.13 ml/m  AORTIC VALVE LVOT Vmax:   80.00 cm/s LVOT Vmean:  44.700 cm/s LVOT VTI:    0.137 m  AORTA Ao Root diam: 3.40 cm Ao Asc diam:  2.80 cm TRICUSPID VALVE TR Peak grad:   54.5 mmHg TR Vmax:        369.00 cm/s  SHUNTS Systemic VTI:  0.14 m Systemic Diam: 2.20 cm Kirk Ruths MD Electronically signed by Kirk Ruths MD Signature Date/Time: 07/22/2021/8:31:47 AM    Final    CT HEAD WO CONTRAST (5MM)  Result Date: 07/20/2021 CLINICAL DATA:  Headache, chronic, with new features or increased frequency. EXAM: CT HEAD WITHOUT CONTRAST TECHNIQUE: Contiguous axial images were obtained from the base of the skull through the vertex without intravenous contrast. RADIATION DOSE REDUCTION: This exam was performed according to the departmental dose-optimization program which includes automated exposure control, adjustment of the mA and/or kV according to patient size and/or use of iterative reconstruction technique. COMPARISON:  None Available. FINDINGS: Brain: No evidence of acute infarction, hemorrhage, hydrocephalus, extra-axial collection or mass lesion/mass effect. Vascular: No hyperdense vessel or unexpected calcification. Skull: Normal. Negative for fracture or focal lesion. Sinuses/Orbits: No acute finding. IMPRESSION: Negative head CT. Electronically Signed   By: Jorje Guild M.D.   On: 07/20/2021 10:30   CT Chest Wo Contrast  Result Date: 07/19/2021 CLINICAL DATA:  Shortness of breath, right chest pain, pneumonia EXAM: CT CHEST WITHOUT CONTRAST TECHNIQUE: Multidetector CT imaging of the chest was performed following the standard protocol without IV contrast.  RADIATION DOSE REDUCTION: This exam was performed according to the departmental dose-optimization program which includes automated exposure control, adjustment of  the mA and/or kV according to patient size and/or use of iterative reconstruction technique. COMPARISON:  Previous studies including chest radiograph done earlier today and CT done on 05/27/2016 FINDINGS: Cardiovascular: There is ectasia of main pulmonary artery measuring 3.6 cm suggesting pulmonary arterial hypertension. Mediastinum/Nodes: No significant lymphadenopathy seen. Lungs/Pleura: Severe bullous emphysema is seen in both lungs especially in the right upper lung fields and left lower lung fields. There is new patchy alveolar infiltrate in the anterior segment of right upper lobe. There is subtle increase in interstitial markings in the lateral aspect of right mid lung fields. Rest of the lung fields show no new infiltrates. There is no pleural effusion or pneumothorax. Upper Abdomen: Unremarkable. Musculoskeletal: Unremarkable. IMPRESSION: Severe COPD with bullous emphysema. There is new alveolar and interstitial infiltrate in the anterior segment of right upper lobe consistent with pneumonia. There is no pleural effusion or pneumothorax. Electronically Signed   By: Elmer Picker M.D.   On: 07/19/2021 15:20   DG Chest Portable 1 View  Result Date: 07/19/2021 CLINICAL DATA:  Cough, shortness of breath EXAM: PORTABLE CHEST 1 VIEW COMPARISON:  Previous studies including the examination of 05/26/2016 FINDINGS: Cardiac size is within normal limits. Low position of diaphragms suggests COPD. Bullous emphysema is seen in both lungs, more so in the right upper and left lower lung fields. Linear densities in the right parahilar region have not changed significantly. There are no signs of alveolar pulmonary edema or new focal consolidation. There is interval increase in interstitial markings in the right lower lung fields. There is no pleural effusion or pneumothorax. IMPRESSION: Severe COPD with bullous emphysema. There is no focal pulmonary consolidation. There is slight increase in interstitial markings  in the right lower lung fields which may be due to crowding of bronchovascular structures or interstitial pneumonia. Electronically Signed   By: Elmer Picker M.D.   On: 07/19/2021 14:33     Assessment & Plan:   No problem-specific Assessment & Plan notes found for this encounter.     Martyn Ehrich, NP 08/02/2021

## 2021-08-02 NOTE — Telephone Encounter (Signed)
Patient was admitted for pneumonia, seen on CT imaging. Needs repeat in 4-6 weeks to ensure resolution. He is not insured but states he was told he got approved medicaid while in the hospital. Can we follow up on this and/or how much would it cost if self pay?

## 2021-08-05 NOTE — Telephone Encounter (Signed)
If pt is getting Medicaid then he will not need (or qualify for) patient assistance, his copays should be between $0-$5 no matter where he fills.  If pt does not ultimately get Medicaid then submitting application to [facility] for Uninsured patient assistance would be next best option (for the Trelegy and/or Incruse at least, Dulera does not have a patient assistance program). For Medicare Part D there is a requirement to have spent at least $600 out of pocket on medications, however I'm unsure if there are any similar requirements for uninsured patients. If approved then any medication received through PAP would be free of charge, but reminder that this option is only available if he does NOT qualify for Medicaid and remains uninsured.  I don't know the specifics of [facility] off the top of my head, but I know they generally serve indigent and un/underinsured pt's and (if memory serves) have a list of medications they dispense for a set price based off of what tier they fall into. Having said, that I don't know if there is any specific criteria this patient would need to meet in order to be able to fill there--I also don't know if Trelegy, Incruse, or Dulera are even on the list of available medications. Regardless, I would expect the pt to see copays from $5-$20 if he ends up filling through [facility].  Hope this helps!

## 2021-08-05 NOTE — Telephone Encounter (Signed)
If I send either Dulera/Incruse or Trelegy prescription [facility] can he get medication cheaper there? Or should he apply for patient assistance. He does not have insurance currently, was told he was getting [facility]   [facility] team, can we check on medicaid. Was this process started in the hospital. How can we ensure he has been enrolled like he was told and how long should it take for benefits to kick in   Cc: PCC/pharm

## 2021-08-06 NOTE — Telephone Encounter (Signed)
Does he need a CT chest without?

## 2021-08-06 NOTE — Telephone Encounter (Signed)
CT chest wo contrast

## 2021-08-06 NOTE — Telephone Encounter (Signed)
Order for CT chest has been placed and will be routed back.

## 2021-08-06 NOTE — Telephone Encounter (Signed)
There is no order for CT to be done in 4-6 weeks

## 2021-08-07 ENCOUNTER — Emergency Department (HOSPITAL_COMMUNITY)
Admission: EM | Admit: 2021-08-07 | Discharge: 2021-08-07 | Disposition: A | Discharge status: Home / Self Care | Payer: Self-pay | Attending: Emergency Medicine | Admitting: Emergency Medicine

## 2021-08-07 ENCOUNTER — Ambulatory Visit (INDEPENDENT_AMBULATORY_CARE_PROVIDER_SITE_OTHER): Payer: Self-pay | Admitting: Nurse Practitioner

## 2021-08-07 ENCOUNTER — Encounter: Payer: Self-pay | Admitting: Nurse Practitioner

## 2021-08-07 ENCOUNTER — Telehealth: Payer: Self-pay | Admitting: Nurse Practitioner

## 2021-08-07 ENCOUNTER — Encounter (HOSPITAL_COMMUNITY): Payer: Self-pay | Admitting: Emergency Medicine

## 2021-08-07 ENCOUNTER — Other Ambulatory Visit: Payer: Self-pay

## 2021-08-07 ENCOUNTER — Emergency Department (HOSPITAL_COMMUNITY): Payer: Self-pay

## 2021-08-07 VITALS — BP 132/93 | HR 109 | Temp 97.6°F | Ht 73.5 in | Wt 121.6 lb

## 2021-08-07 DIAGNOSIS — R0602 Shortness of breath: Secondary | ICD-10-CM

## 2021-08-07 DIAGNOSIS — J449 Chronic obstructive pulmonary disease, unspecified: Secondary | ICD-10-CM | POA: Insufficient documentation

## 2021-08-07 DIAGNOSIS — F419 Anxiety disorder, unspecified: Secondary | ICD-10-CM | POA: Insufficient documentation

## 2021-08-07 DIAGNOSIS — Z7951 Long term (current) use of inhaled steroids: Secondary | ICD-10-CM | POA: Insufficient documentation

## 2021-08-07 DIAGNOSIS — M7989 Other specified soft tissue disorders: Secondary | ICD-10-CM | POA: Insufficient documentation

## 2021-08-07 LAB — CBC
HCT: 38.6 % — ABNORMAL LOW (ref 39.0–52.0)
Hemoglobin: 13.1 g/dL (ref 13.0–17.0)
MCH: 33.2 pg (ref 26.0–34.0)
MCHC: 33.9 g/dL (ref 30.0–36.0)
MCV: 97.7 fL (ref 80.0–100.0)
Platelets: 359 10*3/uL (ref 150–400)
RBC: 3.95 MIL/uL — ABNORMAL LOW (ref 4.22–5.81)
RDW: 14.6 % (ref 11.5–15.5)
WBC: 7.9 10*3/uL (ref 4.0–10.5)
nRBC: 0 % (ref 0.0–0.2)

## 2021-08-07 LAB — BASIC METABOLIC PANEL
Anion gap: 8 (ref 5–15)
BUN: 9 mg/dL (ref 6–20)
CO2: 26 mmol/L (ref 22–32)
Calcium: 8.2 mg/dL — ABNORMAL LOW (ref 8.9–10.3)
Chloride: 104 mmol/L (ref 98–111)
Creatinine, Ser: 1.23 mg/dL (ref 0.61–1.24)
GFR, Estimated: >60 mL/min (ref >60)
Glucose, Bld: 79 mg/dL (ref 70–99)
Potassium: 3.3 mmol/L — ABNORMAL LOW (ref 3.5–5.1)
Sodium: 138 mmol/L (ref 135–145)

## 2021-08-07 MED ORDER — LORAZEPAM 1 MG PO TABS
1.0000 mg | ORAL_TABLET | Freq: Once | ORAL | Status: AC
  Administered 2021-08-07: 1 mg via ORAL
  Filled 2021-08-07: qty 1

## 2021-08-07 MED ORDER — ALBUTEROL SULFATE HFA 108 (90 BASE) MCG/ACT IN AERS: 2.0000 | INHALATION_SPRAY | RESPIRATORY_TRACT | Status: DC | PRN

## 2021-08-07 NOTE — Assessment & Plan Note (Deleted)
-   Recently hospitalized for AECOPD and CAP.  Patient was treated with IV antibiotics and completed in-house treatment.  CTA was negative for PE but did show right upper lobe pneumonia.  Pulmonary was consulted and recommended continuing steroids.  He is doing significantly better today at follow-up.  States that his breathing has improved.  He is currently on [place] and Incruse.  Patient cost will be issue moving forward, he was able to fill 1 month supply of medication due to match program.  We have given patient a sample of Trelegy to have on hand in case he runs out of his current inhalers.  I will reach out to pharmacy to acquire about patient assistance.  Recommend patient follow-up with Dr. Irineo Axon in 1 month or sooner if needed.

## 2021-08-07 NOTE — ED Notes (Signed)
An After Visit Summary was printed and given to the patient. Discharge instructions given and no further questions at this time.  

## 2021-08-07 NOTE — ED Triage Notes (Signed)
Pt reports SHOB, chest pain. Pt recently d/c w/ pneumonia. States he still feels [facility] and is concerned about that.

## 2021-08-07 NOTE — Assessment & Plan Note (Addendum)
-   Admitted from 07/19/21-07/25/21 for CAP. Consulted in patient by Dr. Verlee Monte. Patient developed hemoptysis on 07/22/21, CXR showed no infiltrate. CTA negative for PE, showed right upper lobe pneumonia. Treated with IV Rocephin and azithromycin. He is doing better today. No acute respiratory symptoms, residual weakness. We will get a repeat CT chest wo contrast in 4 to 6 weeks to ensure resolutoin, patient is concerned about cost we will reach out to patient care coordinator to help out with this.  He tells me that he was recently approved for Medicaid.

## 2021-08-07 NOTE — ED Provider Triage Note (Addendum)
Emergency Medicine Provider Triage Evaluation Note  Brett Beck , a 46 y.o. male  was evaluated in triage.  Pt complains of shortness of breath.  Patient reports that he was recently discharged from the facility 2 weeks ago after a 6-day admission for pneumonia.  Patient states that he was discharged with steroids and inhalers.  Patient reports that 2 days ago began having shortness of breath, nausea which he suspects is due to his anxiety.  Patient concern for reoccurrence of pneumonia.  Patient denies any fevers, cough, diarrhea, vomiting, body aches or chills.  Patient has history of COPD.  Patient denies chest pain.  Review of Systems  Positive:  Negative:   Physical Exam  BP 116/76   Pulse (!) 102   Temp 98.4 F (36.9 C) (Oral)   Resp 16   SpO2 98%  Gen:   Awake, no distress   Resp:  Normal effort  MSK:   Moves extremities without difficulty  Other:  Distant lung sounds  Medical Decision Making  Medically screening exam initiated at 4:38 PM.  Appropriate orders placed.  [name] was informed that the remainder of the evaluation will be completed by another provider, this initial triage assessment does not replace that evaluation, and the importance of remaining in the ED until their evaluation is complete.       [name], PA-C 08/07/21 1641

## 2021-08-07 NOTE — Telephone Encounter (Signed)
Pt called stating he received a phone call from [place] reaching out to check up on him.  Also pt states he thinks his pneumonia is back, he is getting short of breath walking around the house, and he can feel it in his chest/ back. Wondering if he can have a prescription sent in so that he does not have to back to the hospital (he was in the hospital for 6 days about 2 weeks ago).

## 2021-08-07 NOTE — Assessment & Plan Note (Addendum)
-   Recently hospitalized for AECOPD and CAP. Pulmonary was consulted and recommended continuing steroids.  He is doing significantly better today at follow-up.  States that his breathing has improved.  He is currently on [place] and Incruse.  Patient cost will be issue moving forward, he was able to fill 1 month supply of medication due to match program.  We have given patient a sample of Trelegy to have on hand in case he runs out of his current inhalers.  I will reach out to pharmacy to acquire about patient assistance.  Recommend patient follow-up with Dr. Irineo Axon in 1 month or sooner if needed.

## 2021-08-07 NOTE — Progress Notes (Signed)
[place], [place]  [address] Phone:  [phone]   Fax:  [phone] Subjective:   Patient ID: Brett Beck, male    DOB: 1975-11-26, 46 y.o.   MRN: [id number]  Chief Complaint  Patient presents with   Shortness of Breath    Patient is here today to discuss his shortness of breath he has been having for the past two days in the morning and afternoons off and on. Patient states that its not consistent and the SOB comes at random times of the day.   Shortness of Breath Associated symptoms include chest pain. Pertinent negatives include no abdominal pain, fever, leg swelling or vomiting.   Brett Beck 46 y.o. male  has a past medical history of Chronic back pain, Degenerative disc disease, Emphysema/COPD ([place]), Opiate abuse, continuous ([place]), and Pneumothorax on left. To the [facility] for shortness of breath and chest pain.  Patient states that he developed intermittent chest pain and shortness of breath yesterday. Chest pain located in the right anterior chest and radiates to the right upper back. Chest pain is mild.   Patient also endorses shortness of breath that occurs at the initiation of activity and resolves with activity. Has been taking prescribed albuterol with no improvement in symptoms. States that he is concerned his pneumonia has returned. Was discharged from the hospital last week after being admitted for hypoxia, pneumonia and respiratory distress. States, " I waited too long last time and had to be carried out of my home in a stretcher.   Verbalizes increased anxiety related to recent stressors. Denies taking an medications for anxiety. Denies any substance usage. Completed recent follow up with pulmonology.  Denies any other concerns today. Denies any fatigue, HA or dizziness. Denies any blurred vision, numbness or tingling. Past Medical History:  Diagnosis Date   Chronic back pain    Degenerative disc disease     Emphysema/COPD ([place])    Opiate abuse, continuous ([place])    Pneumothorax on left     Past Surgical History:  Procedure Laterality Date   lung inflation     left lung   LUNG SURGERY     Dr Arlyce Dice-- left lung   NOSE SURGERY     broken nose    Family History  Problem Relation Age of Onset   Alpha-1 antitrypsin deficiency Mother    Emphysema Mother    Asthma Mother     Social History   Socioeconomic History   Marital status: Single    Spouse name: Not on file   Number of children: Not on file   Years of education: Not on file   Highest education level: Not on file  Occupational History   Occupation: N/A  Tobacco Use   Smoking status: Every Day    Packs/day: 0.25    Years: 12.00    Total pack years: 3.00    Types: Cigarettes   Smokeless tobacco: Never   Tobacco comments:    Smokes 2 cigarettes 2 days ago.  Trying to stop.  10/2021 hfb  Vaping Use   Vaping Use: Never used  Substance and Sexual Activity   Alcohol use: No   Drug use: No    Types: Oxycodone   Sexual activity: Not Currently  Other Topics Concern   Not on file  Social History Narrative   Not on file   Social Determinants of Health   Financial Resource Strain: Not on file  Food Insecurity:  Not on file  Transportation Needs: Not on file  Physical Activity: Not on file  Stress: Not on file  Social Connections: Not on file  Intimate Partner Violence: Not on file    Outpatient Medications Prior to Visit  Medication Sig Dispense Refill   acetaminophen (TYLENOL) 500 MG tablet Take 500 mg by mouth every 6 (six) hours as needed for moderate pain.     albuterol (VENTOLIN HFA) 108 (90 Base) MCG/ACT inhaler Inhale 2 puffs into the lungs every 6 (six) hours as needed for wheezing or shortness of breath. 6.7 g 0   diphenhydrAMINE (BENADRYL) 50 MG capsule Take 50 mg by mouth every 6 (six) hours as needed.     Fluticasone-Umeclidin-Vilant (TRELEGY ELLIPTA) 200-62.5-25 MCG/ACT AEPB Inhale 1 puff into the lungs  daily. 1 each 0   mometasone-formoterol (DULERA) 200-5 MCG/ACT AERO Inhale 2 puffs into the lungs 2 (two) times daily. 1 each 3   nicotine polacrilex (NICORETTE) 2 MG gum Take 1 each (2 mg total) by mouth as needed for smoking cessation. 100 tablet 0   nystatin (MYCOSTATIN) 100000 UNIT/ML suspension Take 5 mLs (500,000 Units total) by mouth 4 (four) times daily. 60 mL 0   umeclidinium bromide (INCRUSE ELLIPTA) 62.5 MCG/ACT AEPB Inhale 1 puff into the lungs daily. 30 each 3   No facility-administered medications prior to visit.    Allergies  Allergen Reactions   Effexor [Venlafaxine] Diarrhea   Hydrocodone-Acetaminophen     REACTION: severe headaches   Ibuprofen Other (See Comments)    Severe stomach issues   Lyrica [Pregabalin] Diarrhea    Swelling in the feet   Neurontin [Gabapentin] Other (See Comments)    Severe stomach issues   Nsaids Other (See Comments)    Severe stomach issues   Tramadol Hcl     REACTION: diarrhea, abdominal pain    Review of Systems  Constitutional:  Negative for chills, fever and malaise/fatigue.  HENT: Negative.    Eyes: Negative.   Respiratory:  Positive for shortness of breath. Negative for cough.   Cardiovascular:  Positive for chest pain. Negative for palpitations and leg swelling.  Gastrointestinal:  Negative for abdominal pain, blood in stool, constipation, diarrhea, nausea and vomiting.  Skin: Negative.   Neurological: Negative.   Psychiatric/Behavioral:  Negative for depression. The patient is not nervous/anxious.   All other systems reviewed and are negative.      Objective:    Physical Exam Vitals reviewed.  Constitutional:      General: He is in acute distress.     Appearance: Normal appearance. He is ill-appearing.     Interventions: He is not intubated.    Comments: Patient appears to be very shaky upon [facility] to the [facility]  HENT:     Head: Normocephalic.  Cardiovascular:     Rate and Rhythm: Normal rate and regular rhythm.      Pulses: Normal pulses.     Heart sounds: Normal heart sounds.     Comments: No obvious peripheral edema Pulmonary:     Effort: Pulmonary effort is normal. No tachypnea, bradypnea, accessory muscle usage or respiratory distress. He is not intubated.     Breath sounds: No stridor. Examination of the left-upper field reveals decreased breath sounds. Examination of the left-middle field reveals decreased breath sounds. Examination of the left-lower field reveals decreased breath sounds. Decreased breath sounds present.  Chest:     Chest wall: No mass, deformity, tenderness, crepitus or edema. There is no dullness to percussion.  Skin:  General: Skin is warm and dry.     Capillary Refill: Capillary refill takes less than 2 seconds.  Neurological:     General: No focal deficit present.     Mental Status: He is alert and oriented to person, place, and time.  Psychiatric:        Mood and Affect: Mood is anxious.        Behavior: Behavior normal.        Thought Content: Thought content normal.        Judgment: Judgment normal.     BP (!) 132/93   Pulse (!) 109   Temp 97.6 F (36.4 C)   Ht 6' 1.5" (1.867 m)   Wt 121 lb 9.6 oz (55.2 kg)   SpO2 99%   BMI 15.83 kg/m  Wt Readings from Last 3 Encounters:  08/07/21 121 lb 9.6 oz (55.2 kg)  08/02/21 121 lb 12.8 oz (55.2 kg)  08/01/21 122 lb 9.6 oz (55.6 kg)    Immunization History  Administered Date(s) Administered   PFIZER(Purple Top)SARS-COV-2 Vaccination 05/25/2019, 06/15/2019    Diabetic Foot Exam - Simple   No data filed     Lab Results  Component Value Date   TSH 0.349 (L) 07/22/2021   Lab Results  Component Value Date   WBC 18.0 (H) 08/01/2021   HGB 12.4 (L) 08/01/2021   HCT 35.9 (L) 08/01/2021   MCV 95 08/01/2021   PLT 420 08/01/2021   Lab Results  Component Value Date   NA 141 08/01/2021   K 4.1 08/01/2021   CO2 26 08/01/2021   GLUCOSE 78 08/01/2021   BUN 12 08/01/2021   CREATININE 1.23 08/01/2021    BILITOT 0.7 07/20/2021   ALKPHOS 75 07/20/2021   AST 14 (L) 07/20/2021   ALT 14 07/20/2021   PROT 5.8 (L) 07/20/2021   ALBUMIN 3.1 (L) 07/20/2021   CALCIUM 8.6 (L) 08/01/2021   ANIONGAP 7 07/24/2021   EGFR 72 08/01/2021   No results found for: "CHOL" No results found for: "HDL" No results found for: "LDLCALC" No results found for: "TRIG" No results found for: "CHOLHDL" No results found for: "HGBA1C"     Assessment & Plan:   Problem List Items Addressed This Visit   None Visit Diagnoses     Shortness of breath    -  Primary   Relevant Orders   EKG 12-Lead, completed, consistent with previous EKG, no ST elevation noted  Due to significant medical history and recent history of respiratory distress and hypoxia due to pneumonia, patient referred to ED and transported with assistance of support staff. Patient stable at departure. DDX include pneumonia recurrence v PE v pneumothorax (verbalizes history of left pneumothorax) v anxiety/ panic attach v ACS  Contacted primary pulmonologist via Epic with no response at time of documentation Patient to follow up in [facility] 1-2 wks after completed of evaluation, sooner as needed        I am having Darrick Penna. Guettler maintain his nicotine polacrilex, acetaminophen, mometasone-formoterol, umeclidinium bromide, albuterol, nystatin, diphenhydrAMINE, and Trelegy Ellipta.  No orders of the defined types were placed in this encounter.    Teena Dunk, NP

## 2021-08-07 NOTE — Discharge Instructions (Signed)
Follow-up with your primary care doctor or pulmonologist if you continue to have shortness of breath.  Return to the emergency room if you have any worsening symptoms.

## 2021-08-07 NOTE — ED Provider Notes (Signed)
[facility] DEPT Provider Note   CSN: [id number] Arrival date & time: 08/07/21  1630     History  Chief Complaint  Patient presents with   Shortness of Breath   Chest Pain    Brett Beck is a 46 y.o. male.  Patient is a 46 year old male who presents with shortness of breath.  He has a history of COPD, chronic back pain, anxiety.  He was recently admitted from May 26 to June 1 for community-acquired pneumonia in association with a COPD exacerbation.  He has been under a lot of anxiety recently as his sister died last month and he has been helping to take care of her 4 children.  He started feeling more short of breath yesterday and today.  He is not sure if it is anxiety or his pneumonia coming back.  He does not have any increased cough.  No fevers.  No leg swelling other than some small amount of swelling in both of his legs that he has had since discharge from the hospital.  He says they get better when he stays off his feet.  No associated chest pain.  He has been referred to a psychiatrist for management of his anxiety but has not yet gotten an appointment.       Home Medications Prior to Admission medications   Medication Sig Start Date End Date Taking? Authorizing Provider  acetaminophen (TYLENOL) 500 MG tablet Take 500 mg by mouth every 6 (six) hours as needed for moderate pain.    [provider]  albuterol (VENTOLIN HFA) 108 (90 Base) MCG/ACT inhaler Inhale 2 puffs into the lungs every 6 (six) hours as needed for wheezing or shortness of breath. 08/01/21 10/30/21  Fenton Foy, NP  diphenhydrAMINE (BENADRYL) 50 MG capsule Take 50 mg by mouth every 6 (six) hours as needed.    [provider]  Fluticasone-Umeclidin-Vilant (TRELEGY ELLIPTA) 200-62.5-25 MCG/ACT AEPB Inhale 1 puff into the lungs daily. 08/02/21   Martyn Ehrich, NP  mometasone-formoterol (DULERA) 200-5 MCG/ACT AERO Inhale 2 puffs into the lungs 2 (two) times  daily. 07/25/21   Charlynne Cousins, MD  nicotine polacrilex (NICORETTE) 2 MG gum Take 1 each (2 mg total) by mouth as needed for smoking cessation. 02/15/16   Withrow, Elyse Jarvis, FNP  nystatin (MYCOSTATIN) 100000 UNIT/ML suspension Take 5 mLs (500,000 Units total) by mouth 4 (four) times daily. 08/01/21   Fenton Foy, NP  umeclidinium bromide (INCRUSE ELLIPTA) 62.5 MCG/ACT AEPB Inhale 1 puff into the lungs daily. 07/26/21   Charlynne Cousins, MD      Allergies    Effexor [venlafaxine], Hydrocodone-acetaminophen, Ibuprofen, Lyrica [pregabalin], Neurontin [gabapentin], Nsaids, and Tramadol hcl    Review of Systems   Review of Systems  Constitutional:  Negative for chills, diaphoresis, fatigue and fever.  HENT:  Negative for congestion, rhinorrhea and sneezing.   Eyes: Negative.   Respiratory:  Positive for shortness of breath. Negative for cough and chest tightness.   Cardiovascular:  Positive for leg swelling. Negative for chest pain.  Gastrointestinal:  Negative for abdominal pain, blood in stool, diarrhea, nausea and vomiting.  Genitourinary:  Negative for difficulty urinating, flank pain, frequency and hematuria.  Musculoskeletal:  Negative for arthralgias and back pain.  Skin:  Negative for rash.  Neurological:  Negative for dizziness, speech difficulty, weakness, numbness and headaches.    Physical Exam Updated Vital Signs BP 115/89   Pulse 79   Temp 98.4 F (36.9 C) (  Oral)   Resp 13   Ht 6' 1.5" (1.867 m)   Wt 55.3 kg   SpO2 97%   BMI 15.88 kg/m  Physical Exam Constitutional:      Appearance: He is well-developed.  HENT:     Head: Normocephalic and atraumatic.  Eyes:     Pupils: Pupils are equal, round, and reactive to light.  Cardiovascular:     Rate and Rhythm: Normal rate and regular rhythm.     Heart sounds: Normal heart sounds.  Pulmonary:     Effort: Pulmonary effort is normal. No respiratory distress.     Breath sounds: Normal breath sounds. No wheezing  or rales.  Chest:     Chest wall: No tenderness.  Abdominal:     General: Bowel sounds are normal.     Palpations: Abdomen is soft.     Tenderness: There is no abdominal tenderness. There is no guarding or rebound.  Musculoskeletal:        General: Normal range of motion.     Cervical back: Normal range of motion and neck supple.     Comments: No edema or calf tenderness  Lymphadenopathy:     Cervical: No cervical adenopathy.  Skin:    General: Skin is warm and dry.     Findings: No rash.  Neurological:     Mental Status: He is alert and oriented to person, place, and time.     ED Results / Procedures / Treatments   Labs (all labs ordered are listed, but only abnormal results are displayed) Labs Reviewed  BASIC METABOLIC PANEL - Abnormal; Notable for the following components:      Result Value   Potassium 3.3 (*)    Calcium 8.2 (*)    All other components within normal limits  CBC - Abnormal; Notable for the following components:   RBC 3.95 (*)    HCT 38.6 (*)    All other components within normal limits    EKG EKG Interpretation  Date/Time:  Wednesday August 07 2021 16:36:58 EDT Ventricular Rate:  101 PR Interval:  215 QRS Duration: 105 QT Interval:  361 QTC Calculation: 468 R Axis:   91 Text Interpretation: Sinus tachycardia Prolonged PR interval Biatrial enlargement Anterior infarct, old since last tracing no significant change Confirmed by Malvin Johns [phone]) on 08/07/2021 8:31:06 PM  Radiology DG Chest 2 View  Result Date: 08/07/2021 CLINICAL DATA:  Shortness of breath EXAM: CHEST - 2 VIEW COMPARISON:  07/23/2021 and 07/22/2021 FINDINGS: Severe emphysema. Further improvement in the anterior airspace opacity in the right upper lobe compatible with improving pneumonia. Cardiac and mediastinal margins appear normal. IMPRESSION: 1. Markedly severe emphysema. 2. Further improvement in the prior right upper lobe pneumonia anteriorly, nearly resolved. Electronically  Signed   By: Van Clines M.D.   On: 08/07/2021 17:12    Procedures Procedures    Medications Ordered in ED Medications  LORazepam (ATIVAN) tablet 1 mg (1 mg Oral Given 08/07/21 1937)    ED Course/ Medical Decision Making/ A&P                           Medical Decision Making Problems Addressed: Shortness of breath: acute illness or injury that poses a threat to life or bodily functions  Amount and/or Complexity of Data Reviewed External Data Reviewed: labs and notes. Labs: ordered. Decision-making details documented in ED Course. Radiology: ordered and independent interpretation performed. Decision-making details documented in ED Course. ECG/medicine  tests: ordered and independent interpretation performed. Decision-making details documented in ED Course.  Risk Decision regarding hospitalization.   Patient is a 46 year old male who presents with shortness of breath.  He thinks it may be related to anxiety.  He had a chest x-ray which showed no evidence of pneumonia.  He has severe COPD.  This was interpreted by me and confirmed by the radiologist.  There is no evidence of pneumothorax.  No pulmonary edema.  His labs are nonconcerning.  His lungs are clear without wheezing or evidence of an acute COPD exacerbation.  He does not have other symptoms that would be more concerning for PE.  No suggestions of ACS.  No ischemic changes on EKG.  He was given dose of Ativan in the ED and is feeling much better.  He is currently asymptomatic.  No shortness of breath on ambulation.  No hypoxia.  He was discharged home in good condition.  At this point given his resolution of symptoms, I do not feel that he needs inpatient hospitalization or other change in his medications.  He was encouraged to follow-up with either his PCP or pulmonologist.  Return precautions were given.  Final Clinical Impression(s) / ED Diagnoses Final diagnoses:  Shortness of breath  Anxiety    Rx / DC Orders ED  Discharge Orders     None         Malvin Johns, MD 08/07/21 2032

## 2021-08-07 NOTE — Telephone Encounter (Signed)
Provider spoke with the patient over the phone to get more clarification with the patient.

## 2021-08-09 ENCOUNTER — Telehealth: Payer: Self-pay | Admitting: Clinical

## 2021-08-09 NOTE — Telephone Encounter (Signed)
Integrated Behavioral Health Case Management Referral Note  08/09/2021 Name: Brett Beck MRN: [id number] DOB: 09-Mar-1975 Brett Beck is a 46 y.o. year old male who sees Fenton Foy, NP for primary care. LCSW was consulted to assess patient's needs and assist the patient with Mental Health Counseling and Resources.  **This was an unsuccessful encounter.**  Interpreter: No.   Interpreter Name & Language: none  Assessment: Patient experiencing Mental Health Concerns .   Intervention: CSW called patient on 08/02/21 and patient indicated he was busy at another appointment. Made plans to follow up the next week. CSW called patient on 6/13, 6/14, and today 6/16. No answer and CSW LVMs at each of these attempts. CSW available from clinic to schedule [facility]) consult upon return call from patient.  SDOH (Social Determinants of Health) assessments performed: No  Review of patient status, including review of consultants reports, relevant laboratory and other test results, and collaboration with appropriate care team members and the patient's provider was performed as part of comprehensive patient evaluation and provision of services.    Estanislado Emms, [facility] [phone]

## 2021-08-12 ENCOUNTER — Telehealth: Payer: Self-pay | Admitting: Clinical

## 2021-08-12 NOTE — Telephone Encounter (Signed)
Integrated Behavioral Health Case Management Referral Note  08/12/2021 Name: Brett Beck MRN: [id number] DOB: Jun 23, 1975 Brett Beck is a 46 y.o. year old male who sees Fenton Foy, NP for primary care. LCSW was consulted to assess patient's needs and assist the patient with Mental Health Counseling and Resources.  Interpreter: No.   Interpreter Name & Language: none  Assessment: Patient experiencing Mental Health Concerns  - anxiety.  Intervention: Patient returned CSW's calls from last week. Discussed referral to psychiatry. Advised patient of [facility]) and [place] ([place]). He would prefer to try another practice. As patient can pay out of pocket, advised patient of self-pay rate at [facility]). Patient agreeable to referral there. Referral coordinator to send and CSW to follow. Patient indicated he will consider counseling as well and will be in touch with CSW about this.  SDOH (Social Determinants of Health) assessments performed: No  Review of patient status, including review of consultants reports, relevant laboratory and other test results, and collaboration with appropriate care team members and the patient's provider was performed as part of comprehensive patient evaluation and provision of services.    Estanislado Emms, [facility] [phone]

## 2021-08-28 ENCOUNTER — Inpatient Hospital Stay: Payer: Self-pay | Admitting: Nurse Practitioner

## 2021-08-28 ENCOUNTER — Telehealth: Payer: Self-pay | Admitting: Primary Care

## 2021-08-28 MED ORDER — TRELEGY ELLIPTA 100-62.5-25 MCG/ACT IN AEPB: 1.0000 | INHALATION_SPRAY | Freq: Every day | RESPIRATORY_TRACT | 0 refills | Status: DC

## 2021-08-28 NOTE — Telephone Encounter (Signed)
Called and spoke with patient, he cannot afford the [facility] and [facility].  Advised I would look for some samples and get financial assistance paperwork for the inhalers and then call him back.  He verbalized understanding.  According to the last OV note, he can use Trelegy if he runs out.  Found samples of Trelegy 100, 4 put up front to last patient until we can get him established with the [facility] and [facility] and get him qualified for financial assistance for Trelegy.  There is no longer any financial assistance for [facility].  I provided the patient with the # for the Health and [place] for [facility] to call and schedule an appointment.  There is nothing available until September.  Printed financial assistance paperwork for Trelegy and put it in the bag with the samples.  I put all of this up front for the patient.  I has to leave a VM for the patient.

## 2021-08-29 NOTE — Telephone Encounter (Signed)
Ct scheduled for 7/7

## 2021-08-30 ENCOUNTER — Ambulatory Visit (HOSPITAL_COMMUNITY): Payer: Self-pay

## 2021-08-30 NOTE — Telephone Encounter (Signed)
Left 2nd vm that samples and patient assistance for Trelegy was left up front for patient to pick up.

## 2021-08-30 NOTE — Progress Notes (Deleted)
Synopsis: Referred for *** by Fenton Foy, NP  Subjective:   PATIENT ID: Brett Beck GENDER: male DOB: Apr 23, 1975, MRN: [id number]  No chief complaint on file.  HPI: 46 year old male, current every day smoker.  Past medical history significant for COPD Pi MZ, PH, bullous emphysema, pneumonia, substance abuse, major depressive disorder, tobacco abuse.  Seen during hospitalization 07/23/21 for RUL CAP, AECOPD  Otherwise pertinent review of systems is negative.  Past Medical History:  Diagnosis Date   Chronic back pain    Degenerative disc disease    Emphysema/COPD ([place])    Opiate abuse, continuous ([place])    Pneumothorax on left      Family History  Problem Relation Age of Onset   Alpha-1 antitrypsin deficiency Mother    Emphysema Mother    Asthma Mother      Past Surgical History:  Procedure Laterality Date   lung inflation     left lung   LUNG SURGERY     Dr Arlyce Dice-- left lung   NOSE SURGERY     broken nose    Social History   Socioeconomic History   Marital status: Single    Spouse name: Not on file   Number of children: Not on file   Years of education: Not on file   Highest education level: Not on file  Occupational History   Occupation: N/A  Tobacco Use   Smoking status: Every Day    Packs/day: 0.25    Years: 12.00    Total pack years: 3.00    Types: Cigarettes   Smokeless tobacco: Never   Tobacco comments:    Smokes 2 cigarettes 2 days ago.  Trying to stop.  10/2021 hfb  Vaping Use   Vaping Use: Never used  Substance and Sexual Activity   Alcohol use: No   Drug use: No    Types: Oxycodone   Sexual activity: Not Currently  Other Topics Concern   Not on file  Social History Narrative   Not on file   Social Determinants of Health   Financial Resource Strain: Not on file  Food Insecurity: Not on file  Transportation Needs: Not on file  Physical Activity: Not on file  Stress: Not on file  Social Connections: Not on file  Intimate  Partner Violence: Not on file     Allergies  Allergen Reactions   Effexor [Venlafaxine] Diarrhea   Hydrocodone-Acetaminophen     REACTION: severe headaches   Ibuprofen Other (See Comments)    Severe stomach issues   Lyrica [Pregabalin] Diarrhea    Swelling in the feet   Neurontin [Gabapentin] Other (See Comments)    Severe stomach issues   Nsaids Other (See Comments)    Severe stomach issues   Tramadol Hcl     REACTION: diarrhea, abdominal pain     Outpatient Medications Prior to Visit  Medication Sig Dispense Refill   acetaminophen (TYLENOL) 500 MG tablet Take 500 mg by mouth every 6 (six) hours as needed for moderate pain.     albuterol (VENTOLIN HFA) 108 (90 Base) MCG/ACT inhaler Inhale 2 puffs into the lungs every 6 (six) hours as needed for wheezing or shortness of breath. 6.7 g 0   diphenhydrAMINE (BENADRYL) 50 MG capsule Take 50 mg by mouth every 6 (six) hours as needed.     Fluticasone-Umeclidin-Vilant (TRELEGY ELLIPTA) 100-62.5-25 MCG/ACT AEPB Inhale 1 puff into the lungs daily. 4 each 0   Fluticasone-Umeclidin-Vilant (TRELEGY ELLIPTA) 200-62.5-25 MCG/ACT AEPB Inhale 1 puff into  the lungs daily. 1 each 0   mometasone-formoterol (DULERA) 200-5 MCG/ACT AERO Inhale 2 puffs into the lungs 2 (two) times daily. 1 each 3   nicotine polacrilex (NICORETTE) 2 MG gum Take 1 each (2 mg total) by mouth as needed for smoking cessation. 100 tablet 0   nystatin (MYCOSTATIN) 100000 UNIT/ML suspension Take 5 mLs (500,000 Units total) by mouth 4 (four) times daily. 60 mL 0   umeclidinium bromide (INCRUSE ELLIPTA) 62.5 MCG/ACT AEPB Inhale 1 puff into the lungs daily. 30 each 3   No facility-administered medications prior to visit.       Objective:   Physical Exam:  General appearance: 46 y.o., male, NAD, conversant  Eyes: anicteric sclerae; PERRL, tracking appropriately HENT: NCAT; MMM Neck: Trachea midline; no lymphadenopathy, no JVD Lungs: CTAB, no crackles, no wheeze, with  normal respiratory effort CV: RRR, no murmur  Abdomen: Soft, non-tender; non-distended, BS present  Extremities: No peripheral edema, warm Skin: Normal turgor and texture; no rash Psych: Appropriate affect Neuro: Alert and oriented to person and place, no focal deficit     There were no vitals filed for this visit.   on *** LPM *** RA BMI Readings from Last 3 Encounters:  08/07/21 15.88 kg/m  08/07/21 15.83 kg/m  08/02/21 15.85 kg/m   Wt Readings from Last 3 Encounters:  08/07/21 122 lb (55.3 kg)  08/07/21 121 lb 9.6 oz (55.2 kg)  08/02/21 121 lb 12.8 oz (55.2 kg)     CBC    Component Value Date/Time   WBC 7.9 08/07/2021 1646   RBC 3.95 (L) 08/07/2021 1646   HGB 13.1 08/07/2021 1646   HGB 12.4 (L) 08/01/2021 1536   HCT 38.6 (L) 08/07/2021 1646   HCT 35.9 (L) 08/01/2021 1536   PLT 359 08/07/2021 1646   PLT 420 08/01/2021 1536   MCV 97.7 08/07/2021 1646   MCV 95 08/01/2021 1536   MCV 95 04/03/2014 1039   MCH 33.2 08/07/2021 1646   MCHC 33.9 08/07/2021 1646   RDW 14.6 08/07/2021 1646   RDW 12.7 08/01/2021 1536   RDW 14.0 04/03/2014 1039   LYMPHSABS 0.4 (L) 07/19/2021 1417   LYMPHSABS 1.2 04/03/2014 1039   MONOABS 1.0 07/19/2021 1417   MONOABS 0.6 04/03/2014 1039   EOSABS 0.0 07/19/2021 1417   EOSABS 0.0 04/03/2014 1039   BASOSABS 0.0 07/19/2021 1417   BASOSABS 0.1 04/03/2014 1039    ***  Chest Imaging: CTA Chest 07/23/21 reviewed by me with RUL consolidation, improving, extensive bullous emphysema  Pulmonary Functions Testing Results:     No data to display            Echocardiogram:    1. Left ventricular ejection fraction, by estimation, is 55 to 60%. The  left ventricle has normal function. The left ventricle has no regional  wall motion abnormalities. Left ventricular diastolic parameters were  normal.   2. Right ventricular systolic function is normal. The right ventricular  size is normal. There is severely elevated pulmonary artery  systolic  pressure.   3. The mitral valve is normal in structure. Trivial mitral valve  regurgitation. No evidence of mitral stenosis.   4. The aortic valve is tricuspid. Aortic valve regurgitation is not  visualized. No aortic stenosis is present.   5. The inferior vena cava is dilated in size with >50% respiratory  variability, suggesting right atrial pressure of 8 mmHg.       Assessment & Plan:    Plan:  Maryjane Hurter, MD [facility] 08/30/2021 2:05 PM

## 2021-09-02 ENCOUNTER — Ambulatory Visit: Payer: Self-pay | Admitting: Student

## 2021-09-16 ENCOUNTER — Telehealth: Payer: Self-pay | Admitting: Student

## 2021-09-16 MED ORDER — TRELEGY ELLIPTA 100-62.5-25 MCG/ACT IN AEPB: 1.0000 | INHALATION_SPRAY | Freq: Every day | RESPIRATORY_TRACT | 0 refills | Status: DC

## 2021-09-16 NOTE — Telephone Encounter (Signed)
Medication sent in. Advised patient to call before his Trelegy runs out if he needs samples. Nothing further needed

## 2021-09-16 NOTE — Telephone Encounter (Signed)
Called patient and he will call office back with what dose of Trelegy he is taking. Both doses are in chart. Patient agreed to send rx to [facility] to figure out the cost.

## 2021-09-16 NOTE — Telephone Encounter (Signed)
Called patient and he states that due to him not having insurance he can not afford Dulera. Patient states that he still has his samples of Trelgy at home and has about a month and a half supply left on that. Patient is wondering what can be done to help him. Patient assistance paperwork has been filled out just waiting response.   Please advise [employer]

## 2021-10-21 ENCOUNTER — Emergency Department (HOSPITAL_COMMUNITY)
Admission: EM | Admit: 2021-10-21 | Discharge: 2021-10-22 | Disposition: A | Discharge status: Home / Self Care | Payer: Self-pay | Attending: Emergency Medicine | Admitting: Emergency Medicine

## 2021-10-21 ENCOUNTER — Encounter (HOSPITAL_COMMUNITY): Payer: Self-pay

## 2021-10-21 ENCOUNTER — Encounter (HOSPITAL_BASED_OUTPATIENT_CLINIC_OR_DEPARTMENT_OTHER): Payer: Self-pay | Admitting: Emergency Medicine

## 2021-10-21 ENCOUNTER — Other Ambulatory Visit: Payer: Self-pay

## 2021-10-21 ENCOUNTER — Emergency Department (HOSPITAL_BASED_OUTPATIENT_CLINIC_OR_DEPARTMENT_OTHER)
Admission: EM | Admit: 2021-10-21 | Discharge: 2021-10-21 | Discharge status: Against Medical Advice | Payer: Self-pay | Attending: Emergency Medicine | Admitting: Emergency Medicine

## 2021-10-21 ENCOUNTER — Emergency Department (HOSPITAL_BASED_OUTPATIENT_CLINIC_OR_DEPARTMENT_OTHER): Payer: Self-pay

## 2021-10-21 DIAGNOSIS — T18128A Food in esophagus causing other injury, initial encounter: Secondary | ICD-10-CM | POA: Insufficient documentation

## 2021-10-21 DIAGNOSIS — R070 Pain in throat: Secondary | ICD-10-CM | POA: Insufficient documentation

## 2021-10-21 DIAGNOSIS — X58XXXA Exposure to other specified factors, initial encounter: Secondary | ICD-10-CM | POA: Insufficient documentation

## 2021-10-21 DIAGNOSIS — R1310 Dysphagia, unspecified: Secondary | ICD-10-CM | POA: Insufficient documentation

## 2021-10-21 DIAGNOSIS — Z5321 Procedure and treatment not carried out due to patient leaving prior to being seen by health care provider: Secondary | ICD-10-CM | POA: Insufficient documentation

## 2021-10-21 MED ORDER — GLUCAGON HCL RDNA (DIAGNOSTIC) 1 MG IJ SOLR
1.0000 mg | Freq: Once | INTRAMUSCULAR | Status: DC
  Filled 2021-10-21: qty 1

## 2021-10-21 MED ORDER — LORAZEPAM 2 MG/ML IJ SOLN
1.0000 mg | Freq: Once | INTRAMUSCULAR | Status: DC
  Filled 2021-10-21: qty 1

## 2021-10-21 MED ORDER — SODIUM CHLORIDE 0.9 % IV BOLUS: 1000.0000 mL | Freq: Once | INTRAVENOUS | Status: DC

## 2021-10-21 NOTE — ED Triage Notes (Signed)
Pt reports eating chicken last night around 9 pm and feels like it became stuck in his throat. Pt endorses difficulty swallowing and throat pain. Airway patent. Pt speaking in full sentences. NAD.

## 2021-10-21 NOTE — ED Triage Notes (Signed)
Patient went to [facility] earlier today and states that he is unable to swallow. Patient is unable to swallow his own saliva. Patient had an x-ray and was negative. Patient is speaking in complete sentences.

## 2021-10-21 NOTE — ED Provider Triage Note (Signed)
Emergency Medicine Provider Triage Evaluation Note  Brett Beck , a 46 y.o. male  was evaluated in triage.  Pt complains of regurgitation.  Patient states that he was eating chicken last evening, about 20 hours ago.  Since that time he has been unable to swallow.  Solids and liquids come right back up after he tries to swallow.  He had soft tissue x-ray performed earlier today at [facility] ([place] this was negative.  No improvement in symptoms to this point.  No history of esophageal stricture or dilation per his report.  Does take Klonopin daily, unable to tolerate meds and he states that he is feeling jittery.  Review of Systems  Positive: Dysphagia Negative: Chest pain  Physical Exam  BP 130/88 (BP Location: Left Arm)   Pulse 66   Temp 99.3 F (37.4 C) (Oral)   Resp 18   Ht 6' 1.5" (1.867 m)   Wt 54.4 kg   SpO2 93%   BMI 15.62 kg/m  Gen:   Awake, no distress   Resp:  Normal effort  MSK:   Moves extremities without difficulty  Other:  Slightly tremulous.  Patient demonstrates what happens when he takes a sip of water, regurgitates after a couple seconds.  Medical Decision Making  Medically screening exam initiated at 6:16 PM.  Appropriate orders placed.  ARCADIO COPE was informed that the remainder of the evaluation will be completed by another provider, this initial triage assessment does not replace that evaluation, and the importance of remaining in the ED until their evaluation is complete.     Carlisle Cater, PA-C 10/21/21 1824

## 2021-10-22 NOTE — ED Notes (Signed)
Patient discharged by Dr Dolly Rias

## 2021-10-22 NOTE — ED Notes (Signed)
Pt was sleeping in waiting room

## 2021-10-22 NOTE — ED Provider Notes (Signed)
[facility] DEPT Provider Note   CSN: [id number] Arrival date & time: 10/21/21  1621     History  Chief Complaint  Patient presents with   unable to swallow   throat pain    Brett Beck is a 46 y.o. male.  Patient has a history of difficulty swallowing intermittently.  Takes an acids at home and has been trying to eat less meat make sure that is cut up smaller chewed better but approximately 28 hours prior to arrival patient ate some chicken and felt to get stuck and then had trouble swallowing his secretions and food and drink so he went to [name].  They did a x-ray which was negative.  Was told dizziness to wait and possibly be transferred to the hospital so he came here for evaluation.  Is been for 1011 hrs.  When he awoke most recently he drank some water and he drank without difficulty.  He is tolerating secretions now.  He drank some water right in front of me and I noted that he was able to swallow without difficulty.        Home Medications Prior to Admission medications   Medication Sig Start Date End Date Taking? Authorizing Provider  acetaminophen (TYLENOL) 500 MG tablet Take 500 mg by mouth every 6 (six) hours as needed for moderate pain.    [provider]  albuterol (VENTOLIN HFA) 108 (90 Base) MCG/ACT inhaler Inhale 2 puffs into the lungs every 6 (six) hours as needed for wheezing or shortness of breath. 08/01/21 10/30/21  Fenton Foy, NP  diphenhydrAMINE (BENADRYL) 50 MG capsule Take 50 mg by mouth every 6 (six) hours as needed.    [provider]  Fluticasone-Umeclidin-Vilant (TRELEGY ELLIPTA) 100-62.5-25 MCG/ACT AEPB Inhale 1 puff into the lungs daily. 09/16/21   Maryjane Hurter, MD  Fluticasone-Umeclidin-Vilant (TRELEGY ELLIPTA) 200-62.5-25 MCG/ACT AEPB Inhale 1 puff into the lungs daily. 08/02/21   Martyn Ehrich, NP  mometasone-formoterol (DULERA) 200-5 MCG/ACT AERO Inhale 2 puffs into the lungs 2 (two)  times daily. 07/25/21   Charlynne Cousins, MD  nicotine polacrilex (NICORETTE) 2 MG gum Take 1 each (2 mg total) by mouth as needed for smoking cessation. 02/15/16   Withrow, Elyse Jarvis, FNP  nystatin (MYCOSTATIN) 100000 UNIT/ML suspension Take 5 mLs (500,000 Units total) by mouth 4 (four) times daily. 08/01/21   Fenton Foy, NP  umeclidinium bromide (INCRUSE ELLIPTA) 62.5 MCG/ACT AEPB Inhale 1 puff into the lungs daily. 07/26/21   Charlynne Cousins, MD      Allergies    Effexor [venlafaxine], Hydrocodone-acetaminophen, Ibuprofen, Lyrica [pregabalin], Neurontin [gabapentin], Nsaids, and Tramadol hcl    Review of Systems   Review of Systems  Physical Exam Updated Vital Signs BP 130/88 (BP Location: Left Arm)   Pulse 66   Temp 99.3 F (37.4 C) (Oral)   Resp 18   Ht 6' 1.5" (1.867 m)   Wt 54.4 kg   SpO2 93%   BMI 15.62 kg/m  Physical Exam Vitals and nursing note reviewed.  Constitutional:      Appearance: He is well-developed.  HENT:     Head: Normocephalic and atraumatic.  Eyes:     Pupils: Pupils are equal, round, and reactive to light.  Cardiovascular:     Rate and Rhythm: Normal rate.  Pulmonary:     Effort: Pulmonary effort is normal. No respiratory distress.  Abdominal:     General: There is no distension.  Musculoskeletal:  General: Normal range of motion.     Cervical back: Normal range of motion.  Neurological:     General: No focal deficit present.     Mental Status: He is alert.     ED Results / Procedures / Treatments   Labs (all labs ordered are listed, but only abnormal results are displayed) Labs Reviewed  CBC WITH DIFFERENTIAL/PLATELET  BASIC METABOLIC PANEL    EKG None  Radiology DG Neck Soft Tissue  Result Date: 10/21/2021 CLINICAL DATA:  Globus sensation. EXAM: NECK SOFT TISSUES - 1+ VIEW COMPARISON:  None Available. FINDINGS: There is no evidence of retropharyngeal soft tissue swelling or epiglottic enlargement. The cervical airway  is unremarkable and no radio-opaque foreign body identified. IMPRESSION: Negative. Electronically Signed   By: Marijo Conception M.D.   On: 10/21/2021 12:47    Procedures Procedures    Medications Ordered in ED Medications  glucagon (human recombinant) (GLUCAGEN) injection 1 mg (has no administration in time range)  LORazepam (ATIVAN) injection 1 mg (1 mg Intravenous Patient Refused/Not Given 10/21/21 2006)  sodium chloride 0.9 % bolus 1,000 mL (has no administration in time range)    ED Course/ Medical Decision Making/ A&P                           Medical Decision Making  Likely esophageal impaction from yesterday, but has self resolved, likely esophageal stricture, will continue antacids at home and fu w/ GI for consideration of endoscopy. No e/o dehydration or other indication for labs or further workup.   Final Clinical Impression(s) / ED Diagnoses Final diagnoses:  Food impaction of esophagus, initial encounter    Rx / DC Orders ED Discharge Orders          Ordered    Ambulatory referral to Gastroenterology        10/22/21 0109              Merrily Pew, MD 10/22/21 [phone]

## 2021-10-22 NOTE — ED Notes (Signed)
Pt stated to tech that he was able to swallow and stated is he good to go?

## 2021-11-12 ENCOUNTER — Telehealth: Payer: Self-pay | Admitting: Student

## 2021-11-13 NOTE — Telephone Encounter (Signed)
Patient states insurance denied inhaler. Can we run a ticket to see what is covered for insurance  The pharmacy suggested wixella, breo or fluticasone?  Please advise

## 2021-11-14 ENCOUNTER — Other Ambulatory Visit (HOSPITAL_COMMUNITY): Payer: Self-pay

## 2021-11-14 MED ORDER — FLUTICASONE-SALMETEROL 250-50 MCG/ACT IN AEPB
1.0000 | INHALATION_SPRAY | Freq: Two times a day (BID) | RESPIRATORY_TRACT | 11 refills | Status: DC
Start: 2021-11-14 — End: 2023-08-04

## 2021-11-14 NOTE — Telephone Encounter (Signed)
Test billing for this patient's plan returns the   following results ICS/LABA for products:  Breo $20.00 Advair disKus/Wixella disKus  $10.00  Dulera,Advair HFA, Symbicort are not on formulary  [facility]

## 2021-11-14 NOTE — Telephone Encounter (Signed)
Dr Verlee Monte please note message from pharmacy on what is covered under patients medication.  Test billing for this patient's plan returns the   following results ICS/LABA for products:   Breo $20.00 Advair disKus/Wixella disKus  $10.00   Dulera,Advair HFA, Symbicort are not on formulary   [place] [facility]

## 2021-11-14 NOTE — Telephone Encounter (Signed)
Pt called back to check on inhaler rx status  Advised waiting on MD to review list from pharm team  He wants to use [facility] [address] in [place]  Epic chat also sent to Dr Verlee Monte to make him aware  Thanks!

## 2022-01-13 ENCOUNTER — Encounter: Payer: Self-pay | Admitting: Emergency Medicine

## 2022-06-18 ENCOUNTER — Encounter (HOSPITAL_COMMUNITY): Payer: Self-pay

## 2022-06-18 ENCOUNTER — Emergency Department (HOSPITAL_COMMUNITY): Payer: Medicaid Other

## 2022-06-18 ENCOUNTER — Other Ambulatory Visit: Payer: Self-pay

## 2022-06-18 ENCOUNTER — Emergency Department (HOSPITAL_COMMUNITY)
Admission: EM | Admit: 2022-06-18 | Discharge: 2022-06-18 | Disposition: A | Discharge status: Home / Self Care | Payer: Medicaid Other | Attending: Student | Admitting: Student

## 2022-06-18 DIAGNOSIS — R0602 Shortness of breath: Secondary | ICD-10-CM | POA: Insufficient documentation

## 2022-06-18 DIAGNOSIS — F17210 Nicotine dependence, cigarettes, uncomplicated: Secondary | ICD-10-CM | POA: Insufficient documentation

## 2022-06-18 DIAGNOSIS — Z9981 Dependence on supplemental oxygen: Secondary | ICD-10-CM | POA: Insufficient documentation

## 2022-06-18 DIAGNOSIS — F13930 Sedative, hypnotic or anxiolytic use, unspecified with withdrawal, uncomplicated: Secondary | ICD-10-CM | POA: Insufficient documentation

## 2022-06-18 DIAGNOSIS — J449 Chronic obstructive pulmonary disease, unspecified: Secondary | ICD-10-CM | POA: Diagnosis not present

## 2022-06-18 DIAGNOSIS — Z7951 Long term (current) use of inhaled steroids: Secondary | ICD-10-CM | POA: Insufficient documentation

## 2022-06-18 LAB — CBC
HCT: 42 % (ref 39.0–52.0)
Hemoglobin: 13.8 g/dL (ref 13.0–17.0)
MCH: 33.2 pg (ref 26.0–34.0)
MCHC: 32.9 g/dL (ref 30.0–36.0)
MCV: 101 fL — ABNORMAL HIGH (ref 80.0–100.0)
Platelets: 460 10*3/uL — ABNORMAL HIGH (ref 150–400)
RBC: 4.16 MIL/uL — ABNORMAL LOW (ref 4.22–5.81)
RDW: 13.4 % (ref 11.5–15.5)
WBC: 10.8 10*3/uL — ABNORMAL HIGH (ref 4.0–10.5)
nRBC: 0 % (ref 0.0–0.2)

## 2022-06-18 LAB — BASIC METABOLIC PANEL
Anion gap: 10 (ref 5–15)
BUN: 12 mg/dL (ref 6–20)
CO2: 32 mmol/L (ref 22–32)
Calcium: 9.3 mg/dL (ref 8.9–10.3)
Chloride: 93 mmol/L — ABNORMAL LOW (ref 98–111)
Creatinine, Ser: 0.77 mg/dL (ref 0.61–1.24)
GFR, Estimated: >60 mL/min (ref >60)
Glucose, Bld: 117 mg/dL — ABNORMAL HIGH (ref 70–99)
Potassium: 3.8 mmol/L (ref 3.5–5.1)
Sodium: 135 mmol/L (ref 135–145)

## 2022-06-18 LAB — BRAIN NATRIURETIC PEPTIDE: B Natriuretic Peptide: 185.8 pg/mL — ABNORMAL HIGH (ref 0.0–100.0)

## 2022-06-18 MED ORDER — DIAZEPAM 5 MG PO TABS
5.0000 mg | ORAL_TABLET | Freq: Once | ORAL | Status: AC
  Administered 2022-06-18: 5 mg via ORAL
  Filled 2022-06-18: qty 1

## 2022-06-18 MED ORDER — LORAZEPAM 2 MG/ML IJ SOLN
1.0000 mg | Freq: Once | INTRAMUSCULAR | Status: AC
  Administered 2022-06-18: 1 mg via INTRAVENOUS
  Filled 2022-06-18: qty 1

## 2022-06-18 MED ORDER — DIAZEPAM 5 MG PO TABS: 5.0000 mg | ORAL_TABLET | Freq: Two times a day (BID) | ORAL | 0 refills | Status: DC

## 2022-06-18 NOTE — ED Triage Notes (Signed)
Patient reports he ran out of his prescribed xanax early due to sister's death last week. Lst dose was on Sunday and feels like he is withdrawing from this and it is affecting his COPD as well. Endorses nausea, diarrhea, sweats, tremors, SOB (normally wears 3-5L [place]).

## 2022-06-18 NOTE — ED Provider Triage Note (Addendum)
Emergency Medicine Provider Triage Evaluation Note  Brett Beck , a 47 y.o. male  was evaluated in triage.  Pt complains of withdrawal.  States in the last 2 days he has been having tremors, nausea vomiting and diarrhea and sweating and short of breath.  Recently ran out of his Xanax on Sunday.  Has been 2 mg 3 times daily for a long time now.  States he recently.  His younger sister and took more Xanax than usual and now ran out of his supply.  Denies fever or chills and abdominal pain.  Patient also has COPD with oxygen requirement.  Normally on 3 to 5 L via nasal cannula.  Now on 4 L.  Denies chest pain.  Review of Systems  Positive: See above Negative: See above  Physical Exam  BP (!) 149/100   Pulse 91   Temp 98.7 F (37.1 C)   Resp 17   Ht  (1.854 m)   Wt 56.7 kg   SpO2 99%   BMI 16.49 kg/m  Gen:   Awake, no distress   Resp:  Normal effort  MSK:   Moves extremities without difficulty  Other:    Medical Decision Making  Medically screening exam initiated at 3:04 AM.  Appropriate orders placed.  Brett Beck was informed that the remainder of the evaluation will be completed by another provider, this initial triage assessment does not replace that evaluation, and the importance of remaining in the ED until their evaluation is complete.  Work upstarted    Roxan Hockey, Sharlett Iles, PA-C 06/18/22 0310

## 2022-06-18 NOTE — ED Provider Notes (Signed)
[place] EMERGENCY DEPARTMENT AT [facility] Provider Note  CSN: [id number] Arrival date & time: 06/18/22 0031  Chief Complaint(s) Withdrawal  HPI Brett Beck is a 47 y.o. male with PMH severe bullous emphysema on 3 to 5 L home oxygen, opiate abuse, benzodiazepine use, severe anxiety who presents emergency department for evaluation of benzodiazepine withdrawal.  Patient states that he has been taking more Xanax more frequently due to worsening anxiety and has since run out of his medication.  He states he has been out of his medication for approximately 1 week.  States that today he started to have tremors and vomiting and is unable to keep any food down.  Patient arrives hypertensive and tremulous.  Denies chest pain, abdominal pain, headache, fever or other systemic symptoms.  Endorses severe anxiety.   Past Medical History Past Medical History:  Diagnosis Date   Chronic back pain    Degenerative disc disease    Emphysema/COPD    Opiate abuse, continuous    Pneumothorax on left    Patient Active Problem List   Diagnosis Date Noted   Edema 08/01/2021   Hospital discharge follow-up 08/01/2021   CAP (community acquired pneumonia) 07/19/2021   COPD with acute exacerbation 07/19/2021   Tobacco abuse 07/19/2021   Protein-calorie malnutrition, severe 07/19/2021   MDD (major depressive disorder), recurrent severe, without psychosis 02/13/2016   Major depressive disorder, recurrent severe without psychotic features 10/02/2015   Anxiety    Opiate withdrawal 03/21/2015   Substance induced mood disorder 03/20/2015   GAD (generalized anxiety disorder)    Opioid use with withdrawal 01/07/2015   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 Authorizing Provider  diazepam (VALIUM) 5 MG tablet Take 1 tablet (5 mg total) by mouth 2 (two) times daily. 06/18/22  Yes Raianna Slight, MD  acetaminophen (TYLENOL) 500 MG tablet Take 500 mg by mouth every 6 (six) hours as needed for moderate pain.    [provider]  albuterol (VENTOLIN HFA) 108 (90 Base) MCG/ACT inhaler Inhale 2 puffs into the lungs every 6 (six) hours as needed for wheezing or shortness of breath. 08/01/21 10/30/21  Ivonne Andrew, NP  diphenhydrAMINE (BENADRYL) 50 MG capsule Take 50 mg by mouth every 6 (six) hours as needed.    [provider]  fluticasone-salmeterol (ADVAIR DISKUS) 250-50 MCG/ACT AEPB Inhale 1 puff into the lungs in the morning and at bedtime. 11/14/21   Omar Person, MD  nicotine polacrilex (NICORETTE) 2 MG gum Take 1 each (2 mg total) by mouth as needed for smoking cessation. 02/15/16   Withrow, Everardo All, FNP  nystatin (MYCOSTATIN) 100000 UNIT/ML suspension Take 5 mLs (500,000 Units total) by mouth 4 (four) times daily. 08/01/21   Ivonne Andrew, NP  umeclidinium bromide (INCRUSE ELLIPTA) 62.5 MCG/ACT AEPB Inhale 1 puff into the lungs daily. 07/26/21   Marinda Elk, MD  Past Surgical History Past Surgical History:  Procedure Laterality Date   lung inflation     left lung   LUNG SURGERY     Dr Edwyna Shell-- left lung   NOSE SURGERY     broken nose   Family History Family History  Problem Relation Age of Onset   Alpha-1 antitrypsin deficiency Mother    Emphysema Mother    Asthma Mother     Social History Social History   Tobacco Use   Smoking status: Every Day    Packs/day: 0.25    Years: 12.00    Additional pack  years: 0.00    Total pack years: 3.00    Types: Cigarettes   Smokeless tobacco: Never   Tobacco comments:    Smokes 2 cigarettes 2 days ago.  Trying to stop.  10/2021 hfb  Vaping Use   Vaping Use: Never used  Substance Use Topics   Alcohol use: No   Drug use: No    Types: Oxycodone   Allergies Effexor [venlafaxine], Hydrocodone-acetaminophen, Ibuprofen, Lyrica [pregabalin], Neurontin [gabapentin], Nsaids, and Tramadol hcl  Review of Systems Review of Systems  Gastrointestinal:  Positive for nausea and vomiting.  Psychiatric/Behavioral:  The patient is nervous/anxious.     Physical Exam Vital Signs  I have reviewed the triage vital signs BP (!) 132/99   Pulse 87   Temp 98.2 F (36.8 C) (Oral)   Resp 15   Ht  (1.854 m)   Wt 56.7 kg   SpO2 97%   BMI 16.49 kg/m   Physical Exam Constitutional:      General: He is not in acute distress.    Appearance: Normal appearance. He is ill-appearing.  HENT:     Head: Normocephalic and atraumatic.     Nose: No congestion or rhinorrhea.  Eyes:     General:        Right eye: No discharge.        Left eye: No discharge.     Extraocular Movements: Extraocular movements intact.     Pupils: Pupils are equal, round, and reactive to light.  Cardiovascular:     Rate and Rhythm: Normal rate and regular rhythm.     Heart sounds: No murmur heard. Pulmonary:     Effort: No respiratory distress.     Breath sounds: No wheezing or rales.  Abdominal:     General: There is no distension.     Tenderness: There is no abdominal tenderness.  Musculoskeletal:        General: Normal range of motion.     Cervical back: Normal range of motion.  Skin:    General: Skin is warm and dry.  Neurological:     General: No focal deficit present.     Mental Status: He is alert.     Comments: Tremulous     ED Results and Treatments Labs (all labs ordered are listed, but only abnormal results are displayed) Labs Reviewed  CBC - Abnormal;  Notable for the following components:      Result Value   WBC 10.8 (*)    RBC 4.16 (*)    MCV 101.0 (*)    Platelets 460 (*)    All other components within normal limits  BASIC METABOLIC PANEL - Abnormal; Notable for the following components:   Chloride 93 (*)    Glucose, Bld 117 (*)    All other components within normal limits  BRAIN NATRIURETIC PEPTIDE - Abnormal; Notable for the following components:  B Natriuretic Peptide 185.8 (*)    All other components within normal limits                                                                                                                          Radiology DG Chest Portable 1 View  Result Date: 06/18/2022 CLINICAL DATA:  Shortness of breath. EXAM: PORTABLE CHEST 1 VIEW COMPARISON:  April 11, 2022 FINDINGS: The heart size and mediastinal contours are within normal limits. The lungs are hyperinflated. Marked severity emphysematous lung disease is seen with large bullae noted within the left lung base. Diffusely increased lung markings are seen within the left upper lobe and right lung base. This is increased in severity when compared to the prior study. There is no evidence of a pleural effusion or pneumothorax. The visualized skeletal structures are unremarkable. IMPRESSION: 1. Marked severity emphysematous lung disease with large bullae noted within the left lung base. 2. Diffusely increased left upper lobe and right lower lobe lung markings which are likely, in part, chronic in nature. A superimposed component of left upper lobe and right lower lobe infiltrate cannot be excluded. Electronically Signed   By: Aram Candela M.D.   On: 06/18/2022 03:32    Pertinent labs & imaging results that were available during my care of the patient were reviewed by me and considered in my medical decision making (see MDM for details).  Medications Ordered in ED Medications  LORazepam (ATIVAN) injection 1 mg (1 mg Intravenous Given 06/18/22 0446)   diazepam (VALIUM) tablet 5 mg (5 mg Oral Given 06/18/22 0606)                                                                                                                                     Procedures Procedures  (including critical care time)  Medical Decision Making / ED Course   This patient presents to the ED for concern of tremors, vomiting, withdrawal, this involves an extensive number of treatment options, and is a complaint that carries with it a high risk of complications and morbidity.  The differential diagnosis includes benzodiazepine withdrawal, panic attack, anxiety, air hunger, electrolyte abnormality  MDM: Patient seen the emergency room for evaluation of suspected benzodiazepine withdrawal.  Physical exam reveals a tremulous ill-appearing patient, hypertensive tachycardic.  Laboratory evaluation with a leukocytosis to 10.8, BNP minimally elevated at 185.8 but no pulmonary edema seen  on chest x-ray.  Patient given single dose IV Ativan leading to significant improvement of his symptoms.  He was then transitioned to oral Valium to keep withdrawal symptoms at day.  At this time, he does not meet inpatient criteria for admission. we had a long conversation about benzodiazepine use and I agreed to offer a short prescription of Valium to bridge the patient to further discussions with his primary psychiatric team at [facility] for discussion on transition off of Xanax and onto a more long-term therapy that does not have such significant addictive potential.  We did discuss that if benzodiazepines are going to remain a part of his anxiety management, Xanax is likely a very poor choice given short half-life and euphoric potential..  We also extensively discussed smoking cessation.  Patient then discharged with outpatient follow-up.   Additional history obtained: -Additional history obtained from father -External records from outside source obtained and reviewed including:  Chart review including previous notes, labs, imaging, consultation notes   Lab Tests: -I ordered, reviewed, and interpreted labs.   The pertinent results include:   Labs Reviewed  CBC - Abnormal; Notable for the following components:      Result Value   WBC 10.8 (*)    RBC 4.16 (*)    MCV 101.0 (*)    Platelets 460 (*)    All other components within normal limits  BASIC METABOLIC PANEL - Abnormal; Notable for the following components:   Chloride 93 (*)    Glucose, Bld 117 (*)    All other components within normal limits  BRAIN NATRIURETIC PEPTIDE - Abnormal; Notable for the following components:   B Natriuretic Peptide 185.8 (*)    All other components within normal limits      EKG   EKG Interpretation  Date/Time:  Wednesday June 18 2022 04:14:41 EDT Ventricular Rate:  96 PR Interval:  172 QRS Duration: 91 QT Interval:  374 QTC Calculation: 473 R Axis:   87 Text Interpretation: Sinus rhythm Biatrial enlargement Confirmed by Andriea Hasegawa ([id number]) on 06/18/2022 5:39:29 PM         Imaging Studies ordered: I ordered imaging studies including chest x-ray I independently visualized and interpreted imaging. I agree with the radiologist interpretation   Medicines ordered and prescription drug management: Meds ordered this encounter  Medications   LORazepam (ATIVAN) injection 1 mg   diazepam (VALIUM) tablet 5 mg   diazepam (VALIUM) 5 MG tablet    Sig: Take 1 tablet (5 mg total) by mouth 2 (two) times daily.    Dispense:  10 tablet    Refill:  0    -I have reviewed the patients home medicines and have made adjustments as needed  Critical interventions none    Cardiac Monitoring: The patient was maintained on a cardiac monitor.  I personally viewed and interpreted the cardiac monitored which showed an underlying rhythm of: NSR  Social Determinants of Health:  Factors impacting patients care include: Extensive discussion on smoking cessation, anxiety  management   Reevaluation: After the interventions noted above, I reevaluated the patient and found that they have :improved  Co morbidities that complicate the patient evaluation  Past Medical History:  Diagnosis Date   Chronic back pain    Degenerative disc disease    Emphysema/COPD    Opiate abuse, continuous    Pneumothorax on left       Dispostion: I considered admission for this patient, but at this time he does not meet inpatient criteria  for admission and he safe for discharge with outpatient follow-up     Final Clinical Impression(s) / ED Diagnoses Final diagnoses:  Benzodiazepine withdrawal without complication     @    Urania Pearlman, Wyn Forster, MD 06/18/22 1740

## 2023-03-27 ENCOUNTER — Encounter: Payer: Self-pay | Admitting: Family Medicine

## 2023-03-27 ENCOUNTER — Ambulatory Visit (INDEPENDENT_AMBULATORY_CARE_PROVIDER_SITE_OTHER): Payer: MEDICAID | Admitting: Family Medicine

## 2023-03-27 VITALS — BP 118/82 | HR 89 | Temp 97.7°F | Ht 73.0 in | Wt 131.2 lb

## 2023-03-27 DIAGNOSIS — F1320 Sedative, hypnotic or anxiolytic dependence, uncomplicated: Secondary | ICD-10-CM | POA: Diagnosis not present

## 2023-03-27 DIAGNOSIS — F332 Major depressive disorder, recurrent severe without psychotic features: Secondary | ICD-10-CM

## 2023-03-27 DIAGNOSIS — G8929 Other chronic pain: Secondary | ICD-10-CM

## 2023-03-27 DIAGNOSIS — J439 Emphysema, unspecified: Secondary | ICD-10-CM

## 2023-03-27 DIAGNOSIS — M545 Low back pain, unspecified: Secondary | ICD-10-CM

## 2023-03-27 DIAGNOSIS — F419 Anxiety disorder, unspecified: Secondary | ICD-10-CM | POA: Diagnosis not present

## 2023-03-27 DIAGNOSIS — R5383 Other fatigue: Secondary | ICD-10-CM | POA: Insufficient documentation

## 2023-03-27 DIAGNOSIS — Z9981 Dependence on supplemental oxygen: Secondary | ICD-10-CM

## 2023-03-27 NOTE — Progress Notes (Signed)
Assessment/Plan:   Assessment and Plan    Chronic Obstructive Pulmonary Disease (COPD) Currently on a prednisone taper following a recent exacerbation. Reports improvement over the past few days. On 4 liters of oxygen and uses Advair, Incruse, and albuterol inhalers. Multiple hospitalizations for COPD exacerbations, including one earlier this week. - Refer to pulmonology for COPD management - Continue prednisone taper - Continue Advair and Incruse - Use albuterol inhaler as needed - Monitor oxygen levels and heart rate  Tachycardia Reports lethargy on 50 mg metoprolol, prescribed for heart rate control. Anxiety contributing to tachycardia. Discussed reducing dose to mitigate lethargy while monitoring heart rate. - Reduce metoprolol dose to 25 mg - Monitor heart rate and symptoms of lethargy  Major Depressive Disorder and Anxiety Managed with Risperdal and gabapentin. Concern about mood management and potential withdrawal from clonazepam. Complex mood disorder requiring psychiatric management. 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 Concern about potential withdrawal from clonazepam. Discussed risks of withdrawal, including potential for hospitalization and need for emergency department access or detox if necessary. - Refer to psychiatry for management of clonazepam withdrawal - Monitor for withdrawal symptoms  Chronic Pain Chronic lower back pain managed with gabapentin. No recent orthopedic or neurosurgical evaluation. - Refer to neurosurgery for evaluation of lower back pain - Continue gabapentin for pain management  General Health Maintenance Requires coordination of multiple referrals and management of chronic conditions. - Coordinate care with care management team - Schedule follow-up appointment in two  weeks to check heart rate and breathing  Follow-up - Follow-up in two weeks to check heart rate and breathing - Call patient about referrals to pulmonology, psychiatry, and neurosurgery.        There are no discontinued medications.  No follow-ups on file.    Subjective:   Encounter date: 03/27/2023  Brett Beck is a 48 y.o. male who has ANXIETY; Depression; ALLERGIC RHINITIS; PAIN IN JOINT, MULTIPLE SITES; BACK PAIN, LUMBAR, CHRONIC; HEADACHE; DIARRHEA, CHRONIC; Emphysema lung (HCC); Atypical chest pain; Bullous emphysema (HCC); Moderate benzodiazepine use disorder (HCC); Opioid use with withdrawal (HCC); Substance induced mood disorder (HCC); GAD (generalized anxiety disorder); Opiate withdrawal (HCC); Anxiety; Major depressive disorder, recurrent severe without psychotic features (HCC); MDD (major depressive disorder), recurrent severe, without psychosis (HCC); CAP (community acquired pneumonia); COPD with acute exacerbation (HCC); Tobacco abuse; Protein-calorie malnutrition, severe (HCC); Edema; and Hospital discharge follow-up on their problem list..   He  has a past medical history of Chronic back pain, Degenerative disc disease, Emphysema/COPD (HCC), Opiate abuse, continuous (HCC), and Pneumothorax on left.Marland Kitchen   He presents with chief complaint of [facility] follow up - COPD. Metoprolol concerns makes him feel like a zombie. Referral to pulmonologist not happy with curretn one. Psychiatrist referral. ) .   Discussed the use of AI scribe software for clinical note transcription with the patient, who gave verbal consent to proceed.  History of Present Illness   The patient, with COPD and major depressive disorder, presents to establish care and requests referrals for COPD management and psychiatric support.  He has a significant history of COPD, with recent exacerbations leading to hospitalization earlier this week and multiple admissions over the past year. He is  currently on a prednisone taper following the recent exacerbation. He is on chronic oxygen therapy at four liters and uses Advair and Incruse as  controller inhalers, with albuterol for breakthrough symptoms, which he uses rarely. He also takes rifumilast and feels his COPD has improved significantly over the past few days.  He has a history of major depressive disorder and anxiety, for which he has been prescribed medications including Risperdal, gabapentin, and fluoxetine. He is concerned about the availability of his clonazepam prescription, which he fears may not be refilled in time due to appointment scheduling issues.  He is currently taking metoprolol for heart rate management, which he reports makes him feel 'like a zombie'. His heart rate often increases due to anxiety or physical activity. He was previously on carvedilol, which was not effective and affected his breathing.  He has a history of substance use disorder, specifically with benzodiazepines and opioids, but reports being seven years clean from opioid abuse. He was previously prescribed buprenorphine and gabapentin for chronic pain management, which he describes as being located in his lower back.         Past Surgical History:  Procedure Laterality Date   lung inflation     left lung   LUNG SURGERY     Dr Edwyna Shell-- left lung   NOSE SURGERY     broken nose    Outpatient Medications Prior to Visit  Medication Sig Dispense Refill   acetaminophen (TYLENOL) 500 MG tablet Take 500 mg by mouth every 6 (six) hours as needed for moderate pain.     CHEST CONGESTION RELIEF DM 10-100 MG/5ML liquid Take 5 mLs by mouth.     clonazePAM (KLONOPIN) 1 MG tablet Take 1 mg by mouth 2 (two) times daily.     cyclobenzaprine (FLEXERIL) 10 MG tablet Take 10 mg by mouth 3 (three) times daily.     diphenhydrAMINE (BENADRYL) 50 MG capsule Take 50 mg by mouth every 6 (six) hours as needed.     FLUoxetine (PROZAC) 20 MG capsule Take 1 capsule by  mouth daily.     fluticasone-salmeterol (ADVAIR DISKUS) 250-50 MCG/ACT AEPB Inhale 1 puff into the lungs in the morning and at bedtime. 60 each 11   gabapentin (NEURONTIN) 300 MG capsule Take by mouth.     guaiFENesin (MUCINEX) 600 MG 12 hr tablet Take by mouth.     ipratropium-albuterol (DUONEB) 0.5-2.5 (3) MG/3ML SOLN Inhale into the lungs.     loperamide (IMODIUM) 2 MG capsule Take by mouth.     metoprolol tartrate (LOPRESSOR) 50 MG tablet Take 50 mg by mouth.     nystatin (MYCOSTATIN) 100000 UNIT/ML suspension Take 5 mLs (500,000 Units total) by mouth 4 (four) times daily. 60 mL 0   omeprazole (PRILOSEC) 20 MG capsule Take by mouth.     predniSONE (DELTASONE) 20 MG tablet Take by mouth.     promethazine (PHENERGAN) 25 MG tablet Take by mouth.     risperiDONE (RISPERDAL) 0.5 MG tablet Take 0.5 mg by mouth.     roflumilast (DALIRESP) 500 MCG TABS tablet Take by mouth.     Simethicone 250 MG CAPS Take by mouth.     umeclidinium bromide (INCRUSE ELLIPTA) 62.5 MCG/ACT AEPB Inhale 1 puff into the lungs daily. 30 each 3   Vitamin D, Ergocalciferol, (DRISDOL) 1.25 MG (50000 UNIT) CAPS capsule Take 1 capsule by mouth once a week.     albuterol (VENTOLIN HFA) 108 (90 Base) MCG/ACT inhaler Inhale 2 puffs into the lungs every 6 (six) hours as needed for wheezing or shortness of breath. 6.7 g 0   diazepam (VALIUM) 5 MG tablet Take 1 tablet (  5 mg total) by mouth 2 (two) times daily. (Patient not taking: Reported on 03/27/2023) 10 tablet 0   nicotine polacrilex (NICORETTE) 2 MG gum Take 1 each (2 mg total) by mouth as needed for smoking cessation. (Patient not taking: Reported on 03/27/2023) 100 tablet 0   No facility-administered medications prior to visit.    Family History  Problem Relation Age of Onset   Alpha-1 antitrypsin deficiency Mother    Emphysema Mother    Asthma Mother     Social History   Socioeconomic History   Marital status: Single    Spouse name: Not on file   Number of  children: Not on file   Years of education: Not on file   Highest education level: Not on file  Occupational History   Occupation: N/A  Tobacco Use   Smoking status: Former    Current packs/day: 0.00    Average packs/day: 0.3 packs/day for 12.0 years (3.0 ttl pk-yrs)    Types: Cigarettes    Quit date: 10/2022    Years since quitting: 0.4    Passive exposure: Never   Smokeless tobacco: Never   Tobacco comments:    Smokes 2 cigarettes 2 days ago.  Trying to stop.  10/2021 hfb  Vaping Use   Vaping status: Never Used  Substance and Sexual Activity   Alcohol use: No   Drug use: No    Types: Oxycodone   Sexual activity: Not Currently  Other Topics Concern   Not on file  Social History Narrative   Not on file   Social Drivers of Health   Financial Resource Strain: Not on file  Food Insecurity: Low Risk  (03/13/2023)   Received from [facility]   Hunger Vital Sign    Worried About Running Out of Food in the Last Year: Never true    Ran Out of Food in the Last Year: Never true  Transportation Needs: No Transportation Needs (03/13/2023)   Received from [facility]    In the past 12 months, has lack of reliable transportation kept you from medical appointments, meetings, work or from getting things needed for daily living? : No  Physical Activity: Not on file  Stress: Not on file  Social Connections: Unknown (07/08/2021)   Received from [facility], [facility]   Social Network    Social Network: Not on file  Intimate Partner Violence: Unknown (05/30/2021)   Received from [employer], [facility]   HITS    Physically Hurt: Not on file    Insult or Talk Down To: Not on file    Threaten Physical Harm: Not on file    Scream or Curse: Not on file                                                                                                  Objective:  Physical Exam: BP 118/82   Pulse 89   Temp 97.7 F (36.5 C) (Temporal)   Ht 6\' 1"  (1.854 m)    Wt 131 lb 3.2 oz (59.5 kg)   SpO2 99%  BMI 17.31 kg/m     Physical Exam Constitutional:      General: He is not in acute distress.    Appearance: Normal appearance. He is cachectic.  HENT:     Head: Normocephalic and atraumatic.     Right Ear: Hearing normal.     Left Ear: Hearing normal.     Nose: Nose normal.  Eyes:     General: No scleral icterus.       Right eye: No discharge.        Left eye: No discharge.     Extraocular Movements: Extraocular movements intact.  Cardiovascular:     Rate and Rhythm: Normal rate and regular rhythm.     Heart sounds: Normal heart sounds.  Pulmonary:     Effort: Pulmonary effort is normal. No respiratory distress.     Breath sounds: Decreased air movement present. No wheezing.  Abdominal:     Palpations: Abdomen is soft.     Tenderness: There is no abdominal tenderness.  Skin:    General: Skin is warm.     Findings: No rash.  Neurological:     General: No focal deficit present.     Mental Status: He is alert.     Cranial Nerves: No cranial nerve deficit.  Psychiatric:        Mood and Affect: Mood normal.        Behavior: Behavior normal. Behavior is cooperative.        Thought Content: Thought content normal.        Judgment: Judgment normal.     No results found.  No results found for this or any previous visit (from the past 2160 hours).      Garner Nash, MD, MS

## 2023-03-27 NOTE — Patient Instructions (Addendum)
For fatigue, reduce metoprolol to 25 mg daily.  Do watch heart rate.  For anxiety, depression, we are referring to psychiatry for management including clonazepam prescription.  Monitor for symptoms of withdrawal.  If you develop any severe symptoms, please go to the emergency department.  For COPD, for current exacerbation, continue prednisone and your daily inhalers with albuterol as needed.  We are referring to pulmonology.  If you develop symptoms of chest pain or shortness of breath that are worsening, please follow-up in the office or go to emergency department.  For chronic lower back pain, we are referring to neurosurgery.  Office will call you to help set up these referrals.

## 2023-04-02 ENCOUNTER — Other Ambulatory Visit: Payer: Self-pay | Admitting: *Deleted

## 2023-04-02 NOTE — Patient Outreach (Signed)
 Care Coordination  04/02/2023  ABDULRAHMAN BRACEY [birth year] [id number]  Successful telephone outreach today with Mr. Medearis. RNCM discussed patient having a Tailored Medicaid plan, [employer]. RNCM assisted patient with reaching [facility] [phone] to request case management. Advised by Jarrell that CM services would be provided by [employer] [phone]. RNCM assisted with contacting [facility] and leaving a detailed message requesting CM for member. RNCM provided Mr. Ormond with contact information for [employer] [phone] and Dr. Rockey Peru, Neurosurgeon [phone] to follow up on referrals. Mr. Lenz voiced understanding and appreciation.   Andrea Dimes RN, BSN [place]  [facility] Health RN Care Manager [phone]

## 2023-04-09 ENCOUNTER — Telehealth: Payer: Self-pay

## 2023-04-09 NOTE — Transitions of Care (Post Inpatient/ED Visit) (Signed)
   04/09/2023  Name: Brett Beck MRN: [id number] DOB: Nov 02, 1975  {[id number]}  RNCM will complete note once able to contact [facility]  Case Management services open for hand off.

## 2023-04-10 ENCOUNTER — Ambulatory Visit: Payer: MEDICAID | Admitting: Family Medicine

## 2023-04-10 ENCOUNTER — Telehealth: Payer: Self-pay | Admitting: Family Medicine

## 2023-04-10 NOTE — Patient Outreach (Incomplete)
04/10/2023   Brett Beck [birth year] [id number]   Successful telephone outreach today with Brett Beck. RNCM discussed patient having a Tailored Medicaid plan, [employer]. RNCM assisted patient with reaching [facility] [phone] to request case management. Advised by Koren Shiver that CM services would be provided by [employer] [phone]. RNCM assisted with contacting [facility] and leaving a detailed message requesting CM for member. RNCM provided Brett Beck with contact and appointment information regarding upcoming appointment dates with his PCP, Dr. Fanny Bien and Pulmonogistto follow up on referrals. Brett Beck voiced understanding and appreciation.    Estanislado Emms RN, BSN [place]  [facility] Health RN Care Manager [phone]

## 2023-04-10 NOTE — Telephone Encounter (Signed)
Copied from CRM [id number]. Topic: Referral - Question >> Apr 10, 2023 10:54 AM Deaijah H wrote: Reason for CRM: Patient called in requesting a new referral due to last facility having an 8-10 month wait. Also would like to know if Dr. Philis Fendt is in the practice as well / please call [phone]

## 2023-04-10 NOTE — Patient Outreach (Signed)
Care Coordination Note  04/10/2023   Brett Beck 07/14/1975 [id number]   Successful telephone outreach today with [name]. RNCM discussed patient having a Tailored Medicaid plan, [employer]. RNCM assisted patient with reaching [facility] [phone] to request case management. Advised by Koren Shiver that CM services would be provided by [employer] [phone]. RNCM assisted with contacting [facility] and leaving a detailed message requesting CM for member. RNCM provided [name] with contact and appointment information regarding upcoming appointment dates with his PCP, Dr. Fanny Bien on 04/10/2023 and Pulmonary referral scheduled for 04/20/2023.  [name] verbalized understanding.   Wyline Mood BSN, Programmer, systems   Transitions of Care  [place] [facility], [facility] Direct Dial Number: [phone]  Fax: [phone]

## 2023-04-10 NOTE — Telephone Encounter (Signed)
Copied from CRM [id number]. Topic: Referral - Status >> Apr 10, 2023 10:10 AM Martinique E wrote: Reason for CRM: Patient called in regarding the status of his Psychiatry referral. Was able to relay to patient that the status was authorized, but he is requesting where the referral is in the process.

## 2023-04-10 NOTE — Telephone Encounter (Signed)
The referral Dr Janee Morn sent over on 03/27/23 for pt for Psychiatry.

## 2023-04-13 NOTE — Telephone Encounter (Signed)
Refrral moved from [facility] to [facility] Letter sent to patient Via Mychart

## 2023-04-17 ENCOUNTER — Telehealth: Payer: Self-pay

## 2023-04-17 NOTE — Transitions of Care (Post Inpatient/ED Visit) (Signed)
04/17/2023  Name: Brett Beck MRN: [id number] DOB: 12-17-1975  Today's TOC FU Call Status: Today's TOC FU Call Status:: Successful TOC FU Call Completed TOC FU Call Complete Date: 04/17/23 Patient's Name and Date of Birth confirmed.  Transition Care Management Follow-up Telephone Call Discharge Facility:  ([facility]) Type of Discharge: Emergency Department Primary Inpatient Discharge Diagnosis:: Shortness of Breath Reason for ED Visit: Respiratory Respiratory Diagnosis:  (SOB) How have you been since you were released from the hospital?: Better Any questions or concerns?: No  Items Reviewed: Did you receive and understand the discharge instructions provided?: Yes Medications obtained,verified, and reconciled?: Yes (Medications Reviewed) Any new allergies since your discharge?: No Dietary orders reviewed?: NA Do you have support at home?: Yes People in Home: parent(s) Name of Support/Comfort Primary Source: Father  Medications Reviewed Today: Medications Reviewed Today     Reviewed by Paulita Fujita, Elpidio Anis, CMA (Certified Medical Assistant) on 04/17/23 at 1554  Med List Status: <None>   Medication Order Taking? Sig Documenting Provider Last Dose Status Informant  acetaminophen (TYLENOL) 500 MG tablet [id number] Yes Take 500 mg by mouth every 6 (six) hours as needed for moderate pain. [provider] Taking Active Self  albuterol (VENTOLIN HFA) 108 (90 Base) MCG/ACT inhaler [id number] Yes Inhale 2 puffs into the lungs every 6 (six) hours as needed for wheezing or shortness of breath. Ivonne Andrew, NP Taking Active   clonazePAM (KLONOPIN) 1 MG tablet [id number] Yes Take 1 mg by mouth 2 (two) times daily as needed. [provider] Taking Active   cyclobenzaprine (FLEXERIL) 10 MG tablet [id number] Yes Take 10 mg by mouth 3 (three) times daily. [provider] Taking Active   diphenhydrAMINE (BENADRYL) 50 MG  capsule [id number] No Take 50 mg by mouth every 6 (six) hours as needed.  Patient not taking: Reported on 04/17/2023   [provider] Not Taking Active   FLUoxetine (PROZAC) 20 MG tablet [id number] Yes Take 20 mg by mouth daily. [provider] Taking Active   fluticasone-salmeterol (ADVAIR DISKUS) 250-50 MCG/ACT AEPB [id number] Yes Inhale 1 puff into the lungs in the morning and at bedtime. Omar Person, MD Taking Active   gabapentin (NEURONTIN) 300 MG capsule [id number] Yes Take by mouth. [provider] Taking Active            Med Note Wyline Mood   Thu Apr 09, 2023  7:40 PM) Per patient, Gabapentin 300 mg by mouth three time a day.  guaiFENesin (MUCINEX) 600 MG 12 hr tablet [id number] No Take by mouth.  Patient not taking: Reported on 04/17/2023   [provider] Not Taking Active   ipratropium-albuterol (DUONEB) 0.5-2.5 (3) MG/3ML SOLN [id number] Yes Inhale into the lungs. [provider] Taking Active   loperamide (IMODIUM) 2 MG capsule [id number] No Take by mouth.  Patient not taking: Reported on 04/17/2023   [provider] Not Taking Active            Med Note Wyline Mood   Thu Apr 09, 2023  7:43 PM) As needed  metoprolol tartrate (LOPRESSOR) 50 MG tablet [id number] Yes Take 25 mg by mouth daily at 12 noon. [provider] Taking Active            Med Note Wyline Mood   Thu Apr 09, 2023  7:47 PM) Per patient, taking Metoprolol tartrate 25 mg mg by mouth twice a day.  nystatin (MYCOSTATIN) 100000 UNIT/ML suspension [id number] No  Take 5 mLs (500,000 Units total) by mouth 4 (four) times daily.  Patient not taking: Reported on 04/17/2023   Ivonne Andrew, NP Not Taking Active            Med Note Wyline Mood   Thu Apr 09, 2023  5:28 PM) As needed  omeprazole (PRILOSEC) 20 MG capsule [id number] Yes Take by mouth. [provider] Taking Active            Med Note Wyline Mood   Thu Apr 09, 2023  7:48 PM) Takes 20 mg every morning  predniSONE (DELTASONE) 10 MG tablet [id number] Yes Take 20 mg by mouth 2 (two) times daily. [provider] Taking Active   promethazine (PHENERGAN) 25 MG tablet [id number] No Take by mouth.  Patient not taking: Reported on 04/17/2023   [provider] Not Taking Active            Med Note Wyline Mood   Thu Apr 09, 2023  5:29 PM) As needed  risperiDONE (RISPERDAL) 0.5 MG tablet [id number] Yes Take 0.5 mg by mouth. [provider] Taking Active            Med Note Wyline Mood   Thu Apr 09, 2023  7:51 PM) Risperdal 0.5 mg by mouth three times a day.  roflumilast (DALIRESP) 500 MCG TABS tablet [id number] No Take by mouth.  Patient not taking: Reported on 04/17/2023   [provider] Not Taking Active            Med Note Wyline Mood   Thu Apr 09, 2023  5:30 PM) daily  Simethicone 250 MG CAPS [id number] Yes Take by mouth. [provider] Taking Active            Med Note Wyline Mood   Thu Apr 09, 2023  5:30 PM) As needed  umeclidinium bromide (INCRUSE ELLIPTA) 62.5 MCG/ACT AEPB [id number] Yes Inhale 1 puff into the lungs daily. Marinda Elk, MD Taking Active   Vitamin D, Ergocalciferol, (DRISDOL) 1.25 MG (50000 UNIT) CAPS capsule [id number] Yes Take 1 capsule by mouth once a week. [provider] Taking Active             Home Care and Equipment/Supplies: Were Home Health Services Ordered?: No Any new equipment or medical supplies ordered?: No  Functional Questionnaire: Do you need assistance with bathing/showering or dressing?: No Do you need assistance with meal preparation?: No Do you need assistance with eating?: No Do you have difficulty maintaining continence: No Do you need assistance with getting out of bed/getting out of a chair/moving?: No Do you have difficulty managing or taking your medications?: No  Follow up appointments reviewed: PCP Follow-up  appointment confirmed?: Yes Date of PCP follow-up appointment?: 04/20/23 Follow-up Provider: Dr. Fanny Bien Specialist [facility] Follow-up appointment confirmed?: No Reason Specialist Follow-Up Not Confirmed: Patient has Specialist Provider Number and will Call for Appointment Do you need transportation to your follow-up appointment?: No Do you understand care options if your condition(s) worsen?: Yes-patient verbalized understanding    SIGNATURE BMS

## 2023-04-20 ENCOUNTER — Ambulatory Visit (INDEPENDENT_AMBULATORY_CARE_PROVIDER_SITE_OTHER): Payer: MEDICAID | Admitting: Family Medicine

## 2023-04-20 ENCOUNTER — Encounter: Payer: Self-pay | Admitting: Family Medicine

## 2023-04-20 VITALS — BP 112/74 | HR 80 | Temp 97.9°F | Wt 141.0 lb

## 2023-04-20 DIAGNOSIS — F1320 Sedative, hypnotic or anxiolytic dependence, uncomplicated: Secondary | ICD-10-CM

## 2023-04-20 DIAGNOSIS — J439 Emphysema, unspecified: Secondary | ICD-10-CM | POA: Diagnosis not present

## 2023-04-20 DIAGNOSIS — M5441 Lumbago with sciatica, right side: Secondary | ICD-10-CM

## 2023-04-20 DIAGNOSIS — M5442 Lumbago with sciatica, left side: Secondary | ICD-10-CM

## 2023-04-20 DIAGNOSIS — G8929 Other chronic pain: Secondary | ICD-10-CM

## 2023-04-20 DIAGNOSIS — I10 Essential (primary) hypertension: Secondary | ICD-10-CM | POA: Diagnosis not present

## 2023-04-20 DIAGNOSIS — F411 Generalized anxiety disorder: Secondary | ICD-10-CM | POA: Diagnosis not present

## 2023-04-20 MED ORDER — CLONAZEPAM 1 MG PO TABS: 1.0000 mg | ORAL_TABLET | Freq: Two times a day (BID) | ORAL | 0 refills | Status: DC | PRN

## 2023-04-20 MED ORDER — GABAPENTIN 300 MG PO CAPS: 300.0000 mg | ORAL_CAPSULE | Freq: Three times a day (TID) | ORAL | 0 refills | Status: DC | PRN

## 2023-04-20 MED ORDER — METOPROLOL TARTRATE 50 MG PO TABS: 25.0000 mg | ORAL_TABLET | Freq: Two times a day (BID) | ORAL | 0 refills | Status: DC

## 2023-04-20 NOTE — Progress Notes (Signed)
 Assessment/Plan:      Chronic Obstructive Pulmonary Disease (COPD) COPD with recent exacerbations leading to multiple hospital admissions. Uses 4-5 liters of oxygen. Current medications include prednisone (tapered to 10 mg daily). No current dyspnea or wheezing. Emphasized medication adherence to prevent exacerbations and hospitalizations. Close monitoring and follow-up with pulmonologist required. -Continue follow-up with pulmonology - Continue current COPD medications  Hypertension Well-controlled on metoprolol 25 mg twice daily -Refill metoprolol 25 mg twice daily  Benzodiazepine Dependence Long-term clonazepam use at 1 mg twice daily. At risk for withdrawal symptoms, which could exacerbate COPD. Difficulty accessing psychiatric care. Discussed withdrawal risks, including potential respiratory complications, and need for short-term prescription to prevent withdrawal while awaiting psychiatric care. - Refill clonazepam 1 mg twice daily for two weeks - Coordinate with care management team to expedite psychiatric appointment  Back Pain Chronic back pain managed with gabapentin 300 mg three times daily. Upcoming neurosurgery appointment on March 3rd. Discussed pain management and gabapentin's role in symptom control until neurosurgery consultation. - Refill gabapentin 300 mg three times daily for one month  General Health Maintenance Managing multiple chronic conditions with recent medication changes. Blood pressure 112/74 mmHg, heart rate stable. Emphasized routine monitoring and specialist follow-up to ensure continuity of care. - Monitor blood pressure and heart rate - Ensure continuity of care with pulmonologist and neurosurgeon  Follow-up - Follow-up with neurosurgery on March 3rd - Coordinate with care management team for psychiatric appointment - Ensure follow-up with pulmonologist Dr. Gerome Apley.       Medications Discontinued During This Encounter  Medication Reason    roflumilast (DALIRESP) 500 MCG TABS tablet    diphenhydrAMINE (BENADRYL) 50 MG capsule    guaiFENesin (MUCINEX) 600 MG 12 hr tablet    promethazine (PHENERGAN) 25 MG tablet    loperamide (IMODIUM) 2 MG capsule    clonazePAM (KLONOPIN) 1 MG tablet Expired Prescription   metoprolol tartrate (LOPRESSOR) 50 MG tablet Reorder   clonazePAM (KLONOPIN) 1 MG tablet Reorder   gabapentin (NEURONTIN) 300 MG capsule Reorder    Return if symptoms worsen or fail to improve.    Subjective:   Encounter date: 04/20/2023  GAINES CARTMELL is a 48 y.o. male who has ANXIETY; Depression; ALLERGIC RHINITIS; PAIN IN JOINT, MULTIPLE SITES; BACK PAIN, LUMBAR, CHRONIC; HEADACHE; DIARRHEA, CHRONIC; Emphysema lung (HCC); Atypical chest pain; Bullous emphysema (HCC); Moderate benzodiazepine use disorder (HCC); Opioid use with withdrawal (HCC); Substance induced mood disorder (HCC); GAD (generalized anxiety disorder); Opiate withdrawal (HCC); Anxiety; Major depressive disorder, recurrent severe without psychotic features (HCC); MDD (major depressive disorder), recurrent severe, without psychosis (HCC); CAP (community acquired pneumonia); COPD with acute exacerbation (HCC); Tobacco abuse; Protein-calorie malnutrition, severe (HCC); Edema; Hospital discharge follow-up; and Other fatigue on their problem list..   He  has a past medical history of Chronic back pain, Degenerative disc disease, Emphysema/COPD (HCC), Opiate abuse, continuous (HCC), and Pneumothorax on left.Marland Kitchen   He presents with chief complaint of Medical Management of Chronic Issues ( 2 week follow up for breathing, heart rate, and fatigue./) .  Discussed the use of AI scribe software for clinical note transcription with the patient, who gave verbal consent to proceed.  History of Present Illness   ARMEN WARING is a 48 year old male with COPD who presents with recent hospital admissions for breathing discomfort.  He has experienced breathing discomfort,  resulting in multiple hospital admissions. He was admitted on February 10th for a couple of days and again on February 21st for a  few hours due to an incident where his oxygen supply was accidentally disconnected.  He is currently using 4 to 5 liters of oxygen for his COPD and reports no current shortness of breath. He uses nebulizer treatments a few times a day and is on a prednisone taper, currently at 10 mg daily. He also uses Incruse daily. Previously, he was on carvedilol and amlodipine, but these were discontinued. He is now on metoprolol 25 mg twice daily and clonazepam 1 mg twice daily. He has a history of using rifumilast, which has been discontinued. He has nystatin on hand for potential thrush but is not currently using it.  He has an upcoming appointment with neurosurgery on March 3rd and is awaiting insurance activation. He uses gabapentin 300 mg three times a day for back pain and is awaiting a refill. He has been unable to secure a timely appointment with a psychiatrist due to long wait times.  No current shortness of breath and no wheezing.       Past Surgical History:  Procedure Laterality Date   lung inflation     left lung   LUNG SURGERY     Dr Edwyna Shell-- left lung   NOSE SURGERY     broken nose    Outpatient Medications Prior to Visit  Medication Sig Dispense Refill   acetaminophen (TYLENOL) 500 MG tablet Take 500 mg by mouth every 6 (six) hours as needed for moderate pain.     cyclobenzaprine (FLEXERIL) 10 MG tablet Take 10 mg by mouth 3 (three) times daily.     FLUoxetine (PROZAC) 20 MG tablet Take 20 mg by mouth daily.     fluticasone-salmeterol (ADVAIR DISKUS) 250-50 MCG/ACT AEPB Inhale 1 puff into the lungs in the morning and at bedtime. 60 each 11   ipratropium-albuterol (DUONEB) 0.5-2.5 (3) MG/3ML SOLN Inhale into the lungs.     omeprazole (PRILOSEC) 20 MG capsule Take by mouth.     predniSONE (DELTASONE) 10 MG tablet Take 20 mg by mouth 2 (two) times daily.      risperiDONE (RISPERDAL) 0.5 MG tablet Take 0.5 mg by mouth.     Simethicone 250 MG CAPS Take by mouth.     umeclidinium bromide (INCRUSE ELLIPTA) 62.5 MCG/ACT AEPB Inhale 1 puff into the lungs daily. 30 each 3   Vitamin D, Ergocalciferol, (DRISDOL) 1.25 MG (50000 UNIT) CAPS capsule Take 1 capsule by mouth once a week.     clonazePAM (KLONOPIN) 1 MG tablet Take 1 mg by mouth 2 (two) times daily as needed.     gabapentin (NEURONTIN) 300 MG capsule Take by mouth.     metoprolol tartrate (LOPRESSOR) 50 MG tablet Take 25 mg by mouth daily at 12 noon.     albuterol (VENTOLIN HFA) 108 (90 Base) MCG/ACT inhaler Inhale 2 puffs into the lungs every 6 (six) hours as needed for wheezing or shortness of breath. 6.7 g 0   nystatin (MYCOSTATIN) 100000 UNIT/ML suspension Take 5 mLs (500,000 Units total) by mouth 4 (four) times daily. (Patient not taking: Reported on 04/20/2023) 60 mL 0   diphenhydrAMINE (BENADRYL) 50 MG capsule Take 50 mg by mouth every 6 (six) hours as needed. (Patient not taking: Reported on 04/20/2023)     guaiFENesin (MUCINEX) 600 MG 12 hr tablet Take by mouth. (Patient not taking: Reported on 04/20/2023)     loperamide (IMODIUM) 2 MG capsule Take by mouth. (Patient not taking: Reported on 04/20/2023)     promethazine (PHENERGAN) 25 MG tablet Take by  mouth. (Patient not taking: Reported on 04/20/2023)     roflumilast (DALIRESP) 500 MCG TABS tablet Take by mouth. (Patient not taking: Reported on 04/20/2023)     No facility-administered medications prior to visit.    Family History  Problem Relation Age of Onset   Alpha-1 antitrypsin deficiency Mother    Emphysema Mother    Asthma Mother     Social History   Socioeconomic History   Marital status: Single    Spouse name: Not on file   Number of children: Not on file   Years of education: Not on file   Highest education level: Not on file  Occupational History   Occupation: N/A  Tobacco Use   Smoking status: Former    Current  packs/day: 0.00    Average packs/day: 0.3 packs/day for 12.0 years (3.0 ttl pk-yrs)    Types: Cigarettes    Quit date: 10/2022    Years since quitting: 0.4    Passive exposure: Never   Smokeless tobacco: Never   Tobacco comments:    Smokes 2 cigarettes 2 days ago.  Trying to stop.  10/2021 hfb  Vaping Use   Vaping status: Never Used  Substance and Sexual Activity   Alcohol use: No   Drug use: No    Types: Oxycodone   Sexual activity: Not Currently  Other Topics Concern   Not on file  Social History Narrative   Not on file   Social Drivers of Health   Financial Resource Strain: Not on file  Food Insecurity: No Food Insecurity (04/09/2023)   Hunger Vital Sign    Worried About Running Out of Food in the Last Year: Never true    Ran Out of Food in the Last Year: Never true  Transportation Needs: No Transportation Needs (04/09/2023)   PRAPARE - Administrator, Civil Service (Medical): No    Lack of Transportation (Non-Medical): No  Physical Activity: Not on file  Stress: Not on file  Social Connections: Unknown (07/08/2021)   Received from [facility], [facility]   Social Network    Social Network: Not on file  Intimate Partner Violence: Not At Risk (04/09/2023)   Humiliation, Afraid, Rape, and Kick questionnaire    Fear of Current or Ex-Partner: No    Emotionally Abused: No    Physically Abused: No    Sexually Abused: No                                                                                                  Objective:  Physical Exam: BP 112/74   Pulse 80   Temp 97.9 F (36.6 C) (Temporal)   Wt 141 lb (64 kg)   SpO2 100%   BMI 18.60 kg/m     Physical Exam Constitutional:      Appearance: He is cachectic.     Interventions: Nasal cannula in place.     Comments: Baseline 4 to 5 L of oxygen  HENT:     Head: Normocephalic and atraumatic.     Right Ear: Hearing normal.     Left Ear:  Hearing normal.     Nose: Nose normal.  Eyes:      General: No scleral icterus.       Right eye: No discharge.        Left eye: No discharge.     Extraocular Movements: Extraocular movements intact.  Cardiovascular:     Rate and Rhythm: Normal rate and regular rhythm.     Heart sounds: Normal heart sounds.  Pulmonary:     Effort: Pulmonary effort is normal. No respiratory distress.     Breath sounds: Decreased air movement present. No wheezing.  Abdominal:     Palpations: Abdomen is soft.     Tenderness: There is no abdominal tenderness.  Skin:    General: Skin is warm.     Findings: No rash.  Neurological:     General: No focal deficit present.     Mental Status: He is alert.     Cranial Nerves: No cranial nerve deficit.  Psychiatric:        Mood and Affect: Mood normal.        Behavior: Behavior normal. Behavior is cooperative.        Thought Content: Thought content normal.        Judgment: Judgment normal.     No results found.  No results found for this or any previous visit (from the past 2160 hours).      Garner Nash, MD, MS

## 2023-04-20 NOTE — Patient Instructions (Signed)
 VISIT SUMMARY:  Today, we discussed your recent hospital admissions due to breathing discomfort related to COPD. We reviewed your current medications and made plans to ensure you have the necessary prescriptions and follow-up appointments to manage your conditions effectively.  YOUR PLAN:  -CHRONIC OBSTRUCTIVE PULMONARY DISEASE (COPD): COPD is a chronic lung condition that makes it hard to breathe. You have had recent flare-ups that led to hospital visits. We emphasized the importance of taking your medications as prescribed to prevent further issues. Your current medications include prednisone, metoprolol, and clonazepam. We will refill your metoprolol and clonazepam prescriptions and ensure you follow up with your pulmonologist.  -BENZODIAZEPINE DEPENDENCE: Long-term use of clonazepam can lead to dependence, and stopping it suddenly can cause withdrawal symptoms that might worsen your COPD. We discussed the risks and will provide a short-term prescription while we work on getting you a psychiatric appointment.  -BACK PAIN: You have chronic back pain and are taking gabapentin to manage it. You have an upcoming appointment with neurosurgery. We will refill your gabapentin prescription to help control your pain until your consultation.  -GENERAL HEALTH MAINTENANCE: We reviewed your overall health, including your stable blood pressure and heart rate. It's important to continue monitoring these and to keep up with your specialist appointments to manage your chronic conditions effectively.  INSTRUCTIONS:  Please follow up with neurosurgery on March 3rd. We will also coordinate with the care management team to expedite your psychiatric appointment and ensure you follow up with your pulmonologist, Dr. Gerome Apley.

## 2023-04-21 ENCOUNTER — Telehealth: Payer: Self-pay | Admitting: *Deleted

## 2023-04-21 NOTE — Progress Notes (Signed)
 Complex Care Management Note  Care Guide Note 04/21/2023 Name: Brett Beck MRN: [id number] DOB: [birth year]  Brett Beck is a 48 y.o. year old male who sees Garnette Gunner, MD for primary care. I reached out to Brett Beck by phone today to offer complex care management services.  Mr. Bonn was given information about Complex Care Management services today including:   The Complex Care Management services include support from the care team which includes your Nurse Care Manager, Clinical Social Worker, or Pharmacist.  The Complex Care Management team is here to help remove barriers to the health concerns and goals most important to you. Complex Care Management services are voluntary, and the patient may decline or stop services at any time by request to their care team member.   Complex Care Management Consent Status: I spoke with patient and he is connected with [facility] case manger he reports he last spoke to this team last week. . No further outreach attempts will be made at this time.   Follow up plan: None   Gwenevere Ghazi  [facility], [facility] Guide  Direct Dial: [phone]  Fax [phone]

## 2023-04-24 ENCOUNTER — Other Ambulatory Visit: Payer: Self-pay | Admitting: Family Medicine

## 2023-04-24 DIAGNOSIS — G8929 Other chronic pain: Secondary | ICD-10-CM

## 2023-04-24 NOTE — Telephone Encounter (Signed)
 Pharmacy change to [facility] main in [place]

## 2023-05-04 ENCOUNTER — Other Ambulatory Visit: Payer: Self-pay | Admitting: Family Medicine

## 2023-05-04 DIAGNOSIS — F411 Generalized anxiety disorder: Secondary | ICD-10-CM

## 2023-05-04 DIAGNOSIS — F1320 Sedative, hypnotic or anxiolytic dependence, uncomplicated: Secondary | ICD-10-CM

## 2023-05-12 ENCOUNTER — Telehealth: Payer: Self-pay

## 2023-05-12 NOTE — Transitions of Care (Post Inpatient/ED Visit) (Signed)
   05/12/2023  Name: Brett Beck MRN: [id number] DOB: Oct 26, 1975  Today's TOC FU Call Status: Today's TOC FU Call Status:: Unsuccessful Call (2nd Attempt) Unsuccessful Call (2nd Attempt) Date: 05/12/23  Attempted to reach the patient regarding the most recent Inpatient/ED visit.  Follow Up Plan: Additional outreach attempts will be made to reach the patient to complete the Transitions of Care (Post Inpatient/ED visit) call.   Lonia Chimera, RN, BSN, CEN [employer]- Transition of Care Team.  [facility] [phone]

## 2023-05-12 NOTE — Transitions of Care (Post Inpatient/ED Visit) (Signed)
   05/12/2023  Name: Brett Beck MRN: [id number] DOB: June 19, 1975  Today's TOC FU Call Status: Today's TOC FU Call Status:: Unsuccessful Call (1st Attempt) Unsuccessful Call (1st Attempt) Date: 05/12/23  Attempted to reach the patient regarding the most recent Inpatient/ED visit.  Follow Up Plan: Additional outreach attempts will be made to reach the patient to complete the Transitions of Care (Post Inpatient/ED visit) call.   Lonia Chimera, RN, BSN, CEN [employer]- Transition of Care Team.  [facility] [phone]

## 2023-05-13 ENCOUNTER — Telehealth: Payer: Self-pay

## 2023-05-13 NOTE — Transitions of Care (Post Inpatient/ED Visit) (Signed)
   05/13/2023  Name: Brett Beck MRN: [id number] DOB: 1976-01-27  Today's TOC FU Call Status: Today's TOC FU Call Status:: Unsuccessful Call (3rd Attempt)  Attempted to reach the patient regarding the most recent Inpatient/ED visit.  Follow Up Plan: No further outreach attempts will be made at this time. We have been unable to contact the patient.  Lonia Chimera, RN, BSN, CEN [employer]- Transition of Care Team.  [facility] [phone]

## 2023-05-13 NOTE — Transitions of Care (Post Inpatient/ED Visit) (Signed)
   05/13/2023  Name: Brett Beck MRN: [id number] DOB: 11-11-1975  Today's TOC FU Call Status: Today's TOC FU Call Status:: Unsuccessful Call (3rd Attempt) Unsuccessful Call (1st Attempt) Date: 05/12/23 Unsuccessful Call (2nd Attempt) Date: 05/12/23 Unsuccessful Call (3rd Attempt) Date: 05/13/23  Attempted to reach the patient regarding the most recent Inpatient/ED visit.  Follow Up Plan: No further outreach attempts will be made at this time. We have been unable to contact the patient.  Signature Jodelle Green, RMA

## 2023-06-10 ENCOUNTER — Telehealth: Payer: Self-pay

## 2023-06-10 NOTE — Telephone Encounter (Signed)
 Attempted to call pt to get him scheduled, pt did not answer, I left voicemail.

## 2023-06-10 NOTE — Telephone Encounter (Signed)
 Copied from CRM [id number]. Topic: Appointments - Scheduling Inquiry for Clinic >> Jun 09, 2023  4:37 PM Tyronne Galloway wrote: Reason for CRM: Patient was admitted to [facility] on 4/7 and is being discharged on 4/16. Patient is needing a hospital follow up appointment with Eulas Hick within 14 days, however I didn't have any appointments available in April. Please reach out to the patient at [phone]. >> Jun 10, 2023  8:40 AM Harriette Limerick wrote: Can Beth double book on a day she isn't already double booked?  If Jerlene Moody is NOT already double book that day, yes this should be fine. Please call pt and have her scheduled.

## 2023-06-15 ENCOUNTER — Ambulatory Visit: Payer: Self-pay | Admitting: Primary Care

## 2023-06-15 NOTE — Telephone Encounter (Addendum)
  Chief Complaint: HFU/info only Additional Notes: Pt calling to schedule hospital F/U appt. Upon further investigation, pt denies any acute/worsening sx above baseline. Pt reports that he is SOB at baseline when he was D/C from hospital. Triager scheduled HFU appt. Of note, triager did not appreciate any audible wheezing/SOB during call. Patient verbalized understanding and to call back with worsening symptoms.    Copied from CRM [id number]. Topic: Clinical - Red Word Triage >> Jun 15, 2023  1:37 PM Eveleen Hinds B wrote: [facility] that prompted transfer to Nurse Triage: Shortness of breath. Reason for Disposition  [1] Discharged from hospital after treatment for COPD AND [2] symptoms unchanged (feels the same)  (all other triage questions negative)  Answer Assessment - Initial Assessment Questions E2C2 Pulmonary Triage - Initial Assessment Questions "Chief Complaint (e.g., cough, sob, wheezing, fever, chills, sweat or additional symptoms) *Go to specific symptom protocol after initial questions. SOB Recently D/C from hospital (1 month) 2 days ago - SOB - reports being intubated   "How long have symptoms been present?" COPD SOB Denies acute sx at this time, but is c/o knees hurting - pt thinks likely r/t hospitalization and immobility.  Protocols used: Breathing Difficulty-A-AH, COPD Post-Hospitalization Follow-up Call-A-AH

## 2023-06-16 NOTE — Telephone Encounter (Signed)
 Patient was scheduled for 4/24 with Sueanne Emerald.

## 2023-06-17 ENCOUNTER — Ambulatory Visit: Payer: Self-pay

## 2023-06-17 NOTE — Telephone Encounter (Signed)
 Noted.  Appointment made for 06/18/23.

## 2023-06-17 NOTE — Telephone Encounter (Signed)
 Chief Complaint: Low blood pressure  Symptoms: blood pressure is 85/73, dizzy Frequency: Today  Pertinent Negatives: Patient denies nausea, vomiting, dehydration Disposition: [] ED /[] Urgent Care (no appt availability in office) / [x] Appointment(In office/virtual)/ []  [facility]/ [] Home Care/ [] Refused Recommended Disposition /[] [facility]/ []  Follow-up with PCP Additional Notes: Patient's dad stated the patient's blood pressure has been low since he got out of the hospital. Patient takes Metoprolol  25 MG. Patient states he feels dizzy when he moves from sitting to standing. Care advice was given and patient has been scheduled with another provider in the office for hospital follow-up due to symptoms and availability.  Copied from CRM [id number]. Topic: Clinical - Red Word Triage >> Jun 17, 2023  2:54 PM Kita Perish H wrote: [facility] that prompted transfer to Nurse Triage: Blood pressure low 83/77, standing dizzy Reason for Disposition  [1] Systolic BP 90-110 AND [2] taking blood pressure medications AND [3] dizzy, lightheaded or weak  Answer Assessment - Initial Assessment Questions 1. BLOOD PRESSURE: "What is the blood pressure?" "Did you take at least two measurements 5 minutes apart?"     BP-83/77 about 30 minutes ago, currently 85/73 2. ONSET: "When did you take your blood pressure?"     Today  3. HOW: "How did you obtain the blood pressure?" (e.g., visiting nurse, automatic home BP monitor)     Automatic home BP monitor  4. HISTORY: "Do you have a history of low blood pressure?" "What is your blood pressure normally?"     Yes, 100/xx 5. MEDICINES: "Are you taking any medications for blood pressure?" If Yes, ask: "Have they been changed recently?"     Metoprolol  6. PULSE RATE: "Do you know what your pulse rate is?"      106 7. OTHER SYMPTOMS: "Have you been sick recently?" "Have you had a recent injury?"     Dizzy 8. PREGNANCY: "Is there any chance you are  pregnant?" "When was your last menstrual period?"     N/A  Protocols used: Blood Pressure - Low-A-AH

## 2023-06-18 ENCOUNTER — Inpatient Hospital Stay: Payer: MEDICAID | Admitting: Family Medicine

## 2023-06-18 ENCOUNTER — Inpatient Hospital Stay: Payer: MEDICAID | Admitting: Primary Care

## 2023-06-19 ENCOUNTER — Ambulatory Visit (INDEPENDENT_AMBULATORY_CARE_PROVIDER_SITE_OTHER): Payer: MEDICAID | Admitting: Family Medicine

## 2023-06-19 ENCOUNTER — Encounter: Payer: Self-pay | Admitting: Family Medicine

## 2023-06-19 VITALS — Temp 97.9°F | Wt 139.8 lb

## 2023-06-19 DIAGNOSIS — I517 Cardiomegaly: Secondary | ICD-10-CM

## 2023-06-19 DIAGNOSIS — I471 Supraventricular tachycardia, unspecified: Secondary | ICD-10-CM

## 2023-06-19 DIAGNOSIS — I952 Hypotension due to drugs: Secondary | ICD-10-CM | POA: Diagnosis not present

## 2023-06-19 DIAGNOSIS — J441 Chronic obstructive pulmonary disease with (acute) exacerbation: Secondary | ICD-10-CM

## 2023-06-19 MED ORDER — DILTIAZEM HCL 30 MG PO TABS: 15.0000 mg | ORAL_TABLET | Freq: Three times a day (TID) | ORAL | 0 refills | Status: DC | PRN

## 2023-06-19 NOTE — Progress Notes (Signed)
 Assessment/Plan:    Assessment & Plan Chronic Obstructive Pulmonary Disease (COPD) with exacerbation Recent exacerbation requiring hospitalization on May 29, 2023. Currently experiencing ongoing respiratory issues. Bilateral atrial enlargement likely secondary to chronic lung disease. Exertion and anxiety may contribute to symptoms. - Encourage follow-up with pulmonologist. - Ensure adequate hydration with water and salt to maintain blood pressure.  Hypotension due to Metoprolol  Low blood pressure likely due to metoprolol , with systolic readings in the 80s to 90s. Metoprolol  may contribute to hypotension and affect respiratory drive. Plan to wean off metoprolol  to mitigate these effects. - Wean off metoprolol  by reducing to 12.5 mg once a day for a few days. - Monitor blood pressure and heart rate at home. - Encourage fluid intake to maintain blood pressure. - Advise to seek medical attention if experiencing dizziness or lightheadedness.  Tachycardia Intermittent tachycardia, possibly triggered by anxiety or exertion. No known arrhythmia. Current management with metoprolol  may contribute to hypotension. Transitioning to diltiazem as an alternative to manage heart rate without affecting respiratory drive. - Transition to diltiazem 15 mg as needed, up to three times a day, for heart rate control if heart rate exceeds 100 bpm or is not reduced by rest. - Monitor heart rate at home. - Refer to cardiology for evaluation of bilateral atrial enlargement and potential arrhythmias.  Bilateral atrial enlargement Bilateral atrial enlargement noted on recent EKG, likely related to chronic lung disease. Further evaluation needed to rule out potential arrhythmias. - Refer to cardiology for further evaluation of bilateral atrial enlargement and potential arrhythmias.      Medications Discontinued During This Encounter  Medication Reason   metoprolol  tartrate (LOPRESSOR ) 50 MG tablet     No  follow-ups on file.    Subjective:   Encounter date: 06/19/2023  Brett Beck is a 48 y.o. male who has ANXIETY; Depression; ALLERGIC RHINITIS; PAIN IN JOINT, MULTIPLE SITES; BACK PAIN, LUMBAR, CHRONIC; HEADACHE; DIARRHEA, CHRONIC; Emphysema lung (HCC); Atypical chest pain; Bullous emphysema (HCC); Moderate benzodiazepine use disorder (HCC); Opioid use with withdrawal (HCC); Substance induced mood disorder (HCC); GAD (generalized anxiety disorder); Opiate withdrawal (HCC); Anxiety; Major depressive disorder, recurrent severe without psychotic features (HCC); MDD (major depressive disorder), recurrent severe, without psychosis (HCC); CAP (community acquired pneumonia); COPD with acute exacerbation (HCC); Tobacco abuse; Protein-calorie malnutrition, severe (HCC); Edema; Hospital discharge follow-up; and Other fatigue on their problem list..   He  has a past medical history of Chronic back pain, Degenerative disc disease, Emphysema/COPD (HCC), Opiate abuse, continuous (HCC), and Pneumothorax on left.Brett Beck   He presents with chief complaint of low blood pressure (60-80s systolic, patient fell out  at the beginning of the week. Was in the hospital beginning of April. ) .   Discussed the use of AI scribe software for clinical note transcription with the patient, who gave verbal consent to proceed.  History of Present Illness Brett Beck is a 48 year old male with COPD who presents with low blood pressure after recent hospital discharge.  He has been experiencing low blood pressure since his recent hospital discharge on April 4th for a COPD exacerbation. Home blood pressure readings have been in the 80s over 60s, and during the visit, they were recorded as 102/66 and 98/72. He believes the low blood pressure is due to his current medication regimen, specifically metoprolol .  He is currently taking metoprolol  12.5 mg twice a day, reduced from his previous dose of 25 mg twice a day. He has a history of  using  metoprolol  for intermittent tachycardia, which is triggered by anxiety and exertion. He denies any known arrhythmia. He is chronically on oxygen and notes that his heart rate fluctuates with exertion. He takes metoprolol  first thing in the morning and then at night around 8 or 9 PM.  He has a history of bilateral atrial enlargement noted on an EKG from April 24th, which he associates with his pulmonary disease. He does not currently see a cardiologist.  In terms of his medication history, he recently stopped taking clonazepam  a few days ago and has not been taking Flexeril. He has gabapentin  available but is uncertain of its benefit and is considering stopping it. He describes himself as a 'salty hog,' indicating a high salt intake, and mentions drinking water and soda regularly.  His past medical history includes COPD, and he reports that his breathing is 'about the same.' He had an echocardiogram in 2023, which was reportedly fine.       Past Surgical History:  Procedure Laterality Date   lung inflation     left lung   LUNG SURGERY     Dr Brett Beck-- left lung   NOSE SURGERY     broken nose    Outpatient Medications Prior to Visit  Medication Sig Dispense Refill   acetaminophen  (TYLENOL ) 500 MG tablet Take 500 mg by mouth every 6 (six) hours as needed for moderate pain.     albuterol  (VENTOLIN  HFA) 108 (90 Base) MCG/ACT inhaler Inhale 2 puffs into the lungs every 6 (six) hours as needed for wheezing or shortness of breath. 6.7 g 0   clonazePAM  (KLONOPIN ) 1 MG tablet Take 1 tablet (1 mg total) by mouth 2 (two) times daily as needed. 30 tablet 0   cyclobenzaprine (FLEXERIL) 10 MG tablet Take 10 mg by mouth 3 (three) times daily.     FLUoxetine  (PROZAC ) 20 MG tablet Take 20 mg by mouth daily.     fluticasone -salmeterol (ADVAIR DISKUS) 250-50 MCG/ACT AEPB Inhale 1 puff into the lungs in the morning and at bedtime. 60 each 11   gabapentin  (NEURONTIN ) 300 MG capsule TAKE 1 CAPSULE(300  MG) BY MOUTH THREE TIMES DAILY AS NEEDED FOR BACK PAIN 90 capsule 0   nystatin  (MYCOSTATIN ) 100000 UNIT/ML suspension Take 5 mLs (500,000 Units total) by mouth 4 (four) times daily. (Patient not taking: Reported on 04/20/2023) 60 mL 0   omeprazole (PRILOSEC) 20 MG capsule Take by mouth.     predniSONE  (DELTASONE ) 10 MG tablet Take 20 mg by mouth 2 (two) times daily.     risperiDONE (RISPERDAL) 0.5 MG tablet Take 0.5 mg by mouth.     Simethicone  250 MG CAPS Take by mouth.     umeclidinium bromide  (INCRUSE ELLIPTA ) 62.5 MCG/ACT AEPB Inhale 1 puff into the lungs daily. 30 each 3   Vitamin D, Ergocalciferol, (DRISDOL) 1.25 MG (50000 UNIT) CAPS capsule Take 1 capsule by mouth once a week.     metoprolol  tartrate (LOPRESSOR ) 50 MG tablet Take 0.5 tablets (25 mg total) by mouth 2 (two) times daily. 90 tablet 0   No facility-administered medications prior to visit.    Family History  Problem Relation Age of Onset   Alpha-1 antitrypsin deficiency Mother    Emphysema Mother    Asthma Mother     Social History   Socioeconomic History   Marital status: Single    Spouse name: Not on file   Number of children: Not on file   Years of education: Not on file  Highest education level: Not on file  Occupational History   Occupation: N/A  Tobacco Use   Smoking status: Former    Current packs/day: 0.00    Average packs/day: 0.3 packs/day for 12.0 years (3.0 ttl pk-yrs)    Types: Cigarettes    Quit date: 10/2022    Years since quitting: 0.6    Passive exposure: Never   Smokeless tobacco: Never   Tobacco comments:    Smokes 2 cigarettes 2 days ago.  Trying to stop.  10/2021 hfb  Vaping Use   Vaping status: Never Used  Substance and Sexual Activity   Alcohol use: No   Drug use: No    Types: Oxycodone    Sexual activity: Not Currently  Other Topics Concern   Not on file  Social History Narrative   Not on file   Social Drivers of Health   Financial Resource Strain: Not on file  Food  Insecurity: Low Risk  (06/01/2023)   Received from [facility]   Hunger Vital Sign    Worried About Running Out of Food in the Last Year: Never true    Ran Out of Food in the Last Year: Never true  Transportation Needs: No Transportation Needs (06/01/2023)   Received from [facility]   PRAPARE - Transportation    Lack of Transportation (Medical): No    Lack of Transportation (Non-Medical): No  Physical Activity: Not on file  Stress: Not on file  Social Connections: Unknown (07/08/2021)   Received from [facility]   Social Network    Social Network: Not on file  Intimate Partner Violence: Not At Risk (04/09/2023)   Humiliation, Afraid, Rape, and Kick questionnaire    Fear of Current or Ex-Partner: No    Emotionally Abused: No    Physically Abused: No    Sexually Abused: No                                                                                                  Objective:  Physical Exam: Temp 97.9 F (36.6 C) (Temporal)   Wt 139 lb 12.8 oz (63.4 kg)   SpO2 90%   BMI 18.44 kg/m    Orthostatic VS for the past 72 hrs (Last 3 readings):  Orthostatic BP Patient Position Orthostatic Pulse  06/19/23 1425 96/68 Lying left side 108  06/19/23 1424 98/72 Standing 110  06/19/23 1422 102/66 Sitting 115     Physical Exam Constitutional:      Appearance: He is cachectic.     Interventions: Nasal cannula in place.     Comments: Baseline 4 to 5 L of oxygen  HENT:     Head: Normocephalic and atraumatic.     Right Ear: Hearing normal.     Left Ear: Hearing normal.     Nose: Nose normal.  Eyes:     General: No scleral icterus.       Right eye: No discharge.        Left eye: No discharge.     Extraocular Movements: Extraocular movements intact.  Cardiovascular:     Rate and Rhythm:  Normal rate and regular rhythm.     Heart sounds: Normal heart sounds.  Pulmonary:     Effort: Pulmonary effort is normal. No respiratory distress.     Breath sounds:  Decreased air movement present. No wheezing.  Abdominal:     Palpations: Abdomen is soft.     Tenderness: There is no abdominal tenderness.  Skin:    General: Skin is warm.     Findings: No rash.  Neurological:     General: No focal deficit present.     Mental Status: He is alert.     Cranial Nerves: No cranial nerve deficit.  Psychiatric:        Mood and Affect: Mood normal.        Behavior: Behavior normal. Behavior is cooperative.        Thought Content: Thought content normal.        Judgment: Judgment normal.     No results found.  No results found for this or any previous visit (from the past 2160 hours).      Carnell Christian, MD, MS

## 2023-06-19 NOTE — Patient Instructions (Signed)
  VISIT SUMMARY: You were seen today for low blood pressure following your recent hospital discharge for a COPD exacerbation. Your blood pressure readings at home have been low, and you believe this may be due to your current medication, metoprolol . We discussed your symptoms, medication regimen, and overall health, and made some adjustments to your treatment plan.  YOUR PLAN: -CHRONIC OBSTRUCTIVE PULMONARY DISEASE (COPD) WITH EXACERBATION: COPD is a chronic lung disease that makes it hard to breathe. You recently had a flare-up that required hospitalization. We recommend you follow up with your pulmonologist and ensure you stay hydrated with water and salt to help maintain your blood pressure.  -HYPOTENSION DUE TO METOPROLOL : Hypotension means low blood pressure. Your low blood pressure is likely due to your medication, metoprolol . We will reduce your metoprolol  dose to 12.5 mg once a day for a few days and monitor your blood pressure and heart rate at home. Make sure to drink plenty of fluids, and seek medical attention if you feel dizzy or lightheaded.  -TACHYCARDIA: Tachycardia means a fast heart rate. Your fast heart rate may be triggered by anxiety or exertion. We will transition you to diltiazem, which you can take 15 mg as needed, up to three times a day, if your heart rate exceeds 100 bpm or is not reduced by rest. Please monitor your heart rate at home and follow up with a cardiologist for further evaluation.  -BILATERAL ATRIAL ENLARGEMENT: Bilateral atrial enlargement means that both of the upper chambers of your heart are enlarged, likely due to your chronic lung disease. We will refer you to a cardiologist for further evaluation to rule out any potential arrhythmias.  INSTRUCTIONS: Please follow up with your pulmonologist and cardiologist as recommended. Monitor your blood pressure and heart rate at home, and seek medical attention if you experience dizziness, lightheadedness, or if your  heart rate exceeds 100 bpm and does not reduce with rest.                      Contains text generated by [employer].                                 Contains text generated by [employer].

## 2023-06-24 ENCOUNTER — Telehealth: Payer: Self-pay

## 2023-06-24 NOTE — Transitions of Care (Post Inpatient/ED Visit) (Signed)
 06/24/2023  Name: MALEKHI ZODROW MRN: [id number] DOB: [birth year]  Today's TOC FU Call Status: Today's TOC FU Call Status:: Successful TOC FU Call Completed TOC FU Call Complete Date: 06/24/23 Patient's Name and Date of Birth confirmed.  Transition Care Management Follow-up Telephone Call Date of Discharge: 06/23/23 Discharge Facility: Other [facility]) Name of Other ([facility]) Discharge Facility: [facility] Type of Discharge: Inpatient Admission Primary Inpatient Discharge Diagnosis:: Shortness of Breath How have you been since you were released from the hospital?: Better Any questions or concerns?: No  Items Reviewed: Did you receive and understand the discharge instructions provided?: Yes Medications obtained,verified, and reconciled?: Yes (Medications Reviewed) Any new allergies since your discharge?: No Dietary orders reviewed?: NA Do you have support at home?: No  Medications Reviewed Today: Medications Reviewed Today     Reviewed by Lonie Roa, CMA (Certified Medical Assistant) on 06/24/23 at 1618  Med List Status: <None>   Medication Order Taking? Sig Documenting Provider Last Dose Status Informant  acetaminophen  (TYLENOL ) 500 MG tablet [id number] Yes Take 500 mg by mouth every 6 (six) hours as needed for moderate pain. [provider] Taking Active Self  albuterol  (VENTOLIN  HFA) 108 (90 Base) MCG/ACT inhaler [id number]  Inhale 2 puffs into the lungs every 6 (six) hours as needed for wheezing or shortness of breath. Jerrlyn Morel, NP  Expired 04/17/23 2359   apixaban (ELIQUIS) 5 MG TABS tablet [id number] Yes Take 5 mg by mouth. [provider] Taking Active   carvedilol (COREG) 6.25 MG tablet [id number] Yes SMARTSIG:1 Tablet(s) By Mouth Morning-Evening [provider] Taking Active   cefdinir (OMNICEF) 300 MG capsule [id number] Yes Take by mouth. [provider] Taking Active   clonazePAM  (KLONOPIN ) 1 MG tablet [id number]  Yes Take 1 tablet (1 mg total) by mouth 2 (two) times daily as needed. Catheryn Cluck, MD Taking Active   cyclobenzaprine (FLEXERIL) 10 MG tablet [id number] Yes Take 10 mg by mouth 3 (three) times daily. [provider] Taking Active   diltiazem (CARDIZEM) 30 MG tablet [id number] Yes Take 0.5 tablets (15 mg total) by mouth 3 (three) times daily as needed (tachycardia or anxiety). Catheryn Cluck, MD Taking Active   FLUoxetine  (PROZAC ) 20 MG tablet [id number] Yes Take 20 mg by mouth daily. [provider] Taking Active   fluticasone -salmeterol (ADVAIR DISKUS) 250-50 MCG/ACT AEPB [id number] Yes Inhale 1 puff into the lungs in the morning and at bedtime. Gloriajean Large, MD Taking Active   gabapentin  (NEURONTIN ) 300 MG capsule [id number] Yes TAKE 1 CAPSULE(300 MG) BY MOUTH THREE TIMES DAILY AS NEEDED FOR BACK PAIN Catheryn Cluck, MD Taking Active   levofloxacin (LEVAQUIN) 500 MG tablet [id number] Yes Take 500 mg by mouth daily. [provider] Taking Active   levofloxacin (LEVAQUIN) 750 MG tablet [id number] Yes Take 750 mg by mouth. [provider] Taking Active   magnesium  oxide (MAG-OX) 400 (240 Mg) MG tablet [id number] Yes Take 1 tablet by mouth daily. [provider] Taking Active   metoprolol  tartrate (LOPRESSOR ) 25 MG tablet [id number] Yes Take 12.5 mg by mouth. [provider] Taking Active   miconazole (MICOTIN) 2 % powder [id number] Yes Apply 1 Application topically. [provider] Taking Active   nystatin  (MYCOSTATIN ) 100000 UNIT/ML suspension [id number] Yes Take 5 mLs (500,000 Units total) by mouth 4 (four) times daily. Jerrlyn Morel, NP Taking Active            Med Note Gaylyn Keas, ANGELENE   Thu  Apr 09, 2023  5:28 PM) As needed  omeprazole (PRILOSEC) 20 MG capsule [id number] Yes Take by mouth. [provider] Taking Active            Med Note Randye Buttner   Thu Apr 09, 2023  7:48 PM) Takes 20 mg every morning   oxyCODONE -acetaminophen  (PERCOCET/ROXICET) 5-325 MG tablet [id number] Yes Take 1 tablet by mouth every 4 (four) hours as needed. [provider] Taking Active   polyvinyl alcohol (LIQUIFILM TEARS) 1.4 % ophthalmic solution [id number] Yes Apply 1 drop to eye. [provider] Taking Active   predniSONE  (DELTASONE ) 10 MG tablet [id number] Yes Take 20 mg by mouth 2 (two) times daily. [provider] Taking Active   risperiDONE (RISPERDAL) 0.5 MG tablet [id number] Yes Take 0.5 mg by mouth. [provider] Taking Active            Med Note Randye Buttner   Thu Apr 09, 2023  7:51 PM) Risperdal 0.5 mg by mouth three times a day.  Simethicone  250 MG CAPS [id number] Yes Take by mouth. [provider] Taking Active            Med Note Randye Buttner   Thu Apr 09, 2023  5:30 PM) As needed  umeclidinium bromide  (INCRUSE ELLIPTA ) 62.5 MCG/ACT AEPB [id number] Yes Inhale 1 puff into the lungs daily. Macdonald Savoy, MD Taking Active   Vitamin D, Ergocalciferol, (DRISDOL) 1.25 MG (50000 UNIT) CAPS capsule [id number] Yes Take 1 capsule by mouth once a week. [provider] Taking Active             Home Care and Equipment/Supplies: Were Home Health Services Ordered?: NA Any new equipment or medical supplies ordered?: NA  Functional Questionnaire: Do you need assistance with bathing/showering or dressing?: No Do you need assistance with meal preparation?: No Do you need assistance with eating?: No Do you have difficulty maintaining continence: No Do you need assistance with getting out of bed/getting out of a chair/moving?: No Do you have difficulty managing or taking your medications?: No  Follow up appointments reviewed: PCP Follow-up appointment confirmed?: Yes Date of PCP follow-up appointment?: 07/03/23 Follow-up Provider: Dr. Hildy [facility]ers [facility] Follow-up appointment confirmed?: NA Do you need transportation to your  follow-up appointment?: No Do you understand care options if your condition(s) worsen?: Yes-patient verbalized understanding    SIGNATURE Kirby Peoples, RMA

## 2023-07-03 ENCOUNTER — Ambulatory Visit: Payer: MEDICAID | Admitting: Family Medicine

## 2023-07-08 ENCOUNTER — Telehealth: Payer: Self-pay

## 2023-07-08 NOTE — Transitions of Care (Post Inpatient/ED Visit) (Signed)
   07/08/2023  Name: Brett Beck MRN: [id number] DOB: Sep 07, 1975  Today's TOC FU Call Status: Today's TOC FU Call Status:: Unsuccessful Call (1st Attempt) Unsuccessful Call (1st Attempt) Date: 07/08/23  Attempted to reach the patient regarding the most recent Inpatient/ED visit.  Follow Up Plan: Additional outreach attempts will be made to reach the patient to complete the Transitions of Care (Post Inpatient/ED visit) call.   Orpha Blade, RN, BSN, CEN [employer]- Transition of Care Team.  [facility] [phone]

## 2023-07-09 ENCOUNTER — Telehealth: Payer: Self-pay

## 2023-07-09 ENCOUNTER — Encounter: Payer: Self-pay | Admitting: Family Medicine

## 2023-07-09 ENCOUNTER — Ambulatory Visit: Payer: MEDICAID | Admitting: Family Medicine

## 2023-07-09 VITALS — BP 110/70 | HR 70 | Temp 98.6°F | Ht 73.0 in | Wt 134.6 lb

## 2023-07-09 DIAGNOSIS — F331 Major depressive disorder, recurrent, moderate: Secondary | ICD-10-CM

## 2023-07-09 DIAGNOSIS — F1320 Sedative, hypnotic or anxiolytic dependence, uncomplicated: Secondary | ICD-10-CM | POA: Diagnosis not present

## 2023-07-09 DIAGNOSIS — J9611 Chronic respiratory failure with hypoxia: Secondary | ICD-10-CM | POA: Diagnosis not present

## 2023-07-09 DIAGNOSIS — J9612 Chronic respiratory failure with hypercapnia: Secondary | ICD-10-CM

## 2023-07-09 DIAGNOSIS — F411 Generalized anxiety disorder: Secondary | ICD-10-CM

## 2023-07-09 DIAGNOSIS — J189 Pneumonia, unspecified organism: Secondary | ICD-10-CM

## 2023-07-09 DIAGNOSIS — M5441 Lumbago with sciatica, right side: Secondary | ICD-10-CM

## 2023-07-09 DIAGNOSIS — Z09 Encounter for follow-up examination after completed treatment for conditions other than malignant neoplasm: Secondary | ICD-10-CM

## 2023-07-09 DIAGNOSIS — M5442 Lumbago with sciatica, left side: Secondary | ICD-10-CM

## 2023-07-09 DIAGNOSIS — G8929 Other chronic pain: Secondary | ICD-10-CM

## 2023-07-09 MED ORDER — DULOXETINE HCL 30 MG PO CPEP: ORAL_CAPSULE | ORAL | 0 refills | Status: DC

## 2023-07-09 MED ORDER — FLUOXETINE HCL 10 MG PO TABS: ORAL_TABLET | ORAL | 0 refills | Status: DC

## 2023-07-09 MED ORDER — CLONAZEPAM 1 MG PO TABS
1.0000 mg | ORAL_TABLET | Freq: Three times a day (TID) | ORAL | 1 refills | Status: DC
Start: 2023-07-09 — End: 2023-09-10

## 2023-07-09 NOTE — Progress Notes (Signed)
 Assessment & Plan   Assessment/Plan:    Assessment & Plan Pneumonia Recent episode of pneumonia, currently improving but strength not fully restored. Treated with cefepime and prednisone .  He has completed his courses.  Chronic Obstructive Pulmonary Disease (COPD) COPD with recent exacerbation. Breathing improved post-treatment with steroids.  He has returned to his baseline nasal cannula 5 L of oxygen.  Discussed the risk of respiratory depression caused by current use of high risk medications including oxycodone , clonazepam , gabapentin , cyclobenzaprine. - Follow-up with pulmonology - Continue Advair discus and Incruse Ellipta  - Continue albuterol  as needed - Monitor respiratory status closely on current high risk medications that may cause respiratory depression  Candidiasis Candidiasis likely secondary to steroid use, treated with Diflucan.  Back pain Chronic back pain, currently managed with gabapentin , which is not effective. Discussed potential switch to duloxetine for pain management. - Discontinue gabapentin . - Initiate duloxetine starting with 30 mg daily for 7 days with increase to 60 mg daily for pain relief.  Depression Chronic depression with significant personal losses in May, contributing to emotional distress. Currently on fluoxetine , Risperdal, clonazepam . - Wean off fluoxetine  by reducing dose from 20 mg a day to 10 mg daily for 7 days then to 5 mg daily for 7 days, then discontinue. - Initiate duloxetine starting with 30 mg daily for 7 days with increase to 60 mg daily for pain relief. - Continue current doses of Risperdal and clonazepam   Anxiety Chronic anxiety managed with clonazepam , fluoxetine , and Risperdal. Concerns about medication management and access to psychiatric care. Discussed potential referral to palliative care for additional support. - Prescribe clonazepam  1 mg TID. - PMP checked - Continue current doses of Risperdal and clonazepam  - Refer to  psychiatry - Refer to social work for behavioral health care connection. - Referral to palliative care for support      Medications Discontinued During This Encounter  Medication Reason   FLUoxetine  (PROZAC ) 20 MG tablet    FLUoxetine  (PROZAC ) 20 MG capsule Expired Prescription   clonazePAM  (KLONOPIN ) 1 MG tablet Reorder   gabapentin  (NEURONTIN ) 300 MG capsule    levofloxacin (LEVAQUIN) 500 MG tablet    carvedilol (COREG) 6.25 MG tablet    cefdinir (OMNICEF) 300 MG capsule    predniSONE  (DELTASONE ) 10 MG tablet     Return in about 1 month (around 08/09/2023) for mood and pain .        Subjective:   Encounter date: 07/09/2023  MARQUECE THORESON is a 47 y.o. male who has ANXIETY; Depression; ALLERGIC RHINITIS; PAIN IN JOINT, MULTIPLE SITES; BACK PAIN, LUMBAR, CHRONIC; HEADACHE; DIARRHEA, CHRONIC; Emphysema lung (HCC); Atypical chest pain; Bullous emphysema (HCC); Moderate benzodiazepine use disorder (HCC); Opioid use with withdrawal (HCC); Substance induced mood disorder (HCC); GAD (generalized anxiety disorder); Opiate withdrawal (HCC); Anxiety; Major depressive disorder, recurrent severe without psychotic features (HCC); MDD (major depressive disorder), recurrent severe, without psychosis (HCC); HCAP (healthcare-associated pneumonia); COPD with acute exacerbation (HCC); Tobacco abuse; Protein-calorie malnutrition, severe (HCC); Edema; Hospital discharge follow-up; Other fatigue; Paroxysmal supraventricular tachycardia (HCC); Hypotension due to drugs; and Atrial enlargement, bilateral on their problem list..   He  has a past medical history of Chronic back pain, Degenerative disc disease, Emphysema/COPD (HCC), Opiate abuse, continuous (HCC), and Pneumothorax on left.Shikha Bibb Aas   He presents with chief complaint of ED follow up (That it was just a copd flare) and wants to see a phsyciatrist  .   Discussed the use of AI scribe software for clinical note transcription with the patient,  who gave  verbal consent to proceed.  History of Present Illness ELIASAR POMYKALA is a 48 year old male with COPD who presents for follow-up after a recent hospitalization for pneumonia.  He recently experienced a hospitalization at [facility] on 057/25 discharged on 07/07/2023.  He was hospitalized due to due to COPD with chronic hypoxic and hypercapnic respiratory failure in addition to pneumonia secondary to candidal and bacterial pneumonia.  He was treated with Diflucan, prednisone , and cefepime.  He was discharged with his baseline nasal cannula oxygen 5 L.  He has been compliant with treatment.  He feels better than he has in a while, although his strength is not fully restored.  PT and OT were provided by referral.  Transition of care call occurred on 07/08/2023.  He has a history of anxiety and depression.  He is currently on clonazepam  1 mg TID. He was also on Risperdal, fluoxetine , and gabapentin . Gabapentin  is used for anxiety and back pain anxiety , but he is unsure of its effectiveness and is open to discontinuing it. He takes Flexeril on a PRN basis, approximately twice a week.  He has a history of significant personal loss, including the death of his mother and sister, both occurring in May, which he finds to be a particularly difficult month. This history contributes to his current mental health challenges.    He reports being in and out of the hospital multiple times recently, with three admissions since his last visit. He expresses concern about his medication management and the need for psychiatric support, noting difficulties in accessing services that accept his insurance.       Past Surgical History:  Procedure Laterality Date   lung inflation     left lung   LUNG SURGERY     Dr Percy Bracken-- left lung   NOSE SURGERY     broken nose    Outpatient Medications Prior to Visit  Medication Sig Dispense Refill   acetaminophen  (TYLENOL ) 500  MG tablet Take 500 mg by mouth every 6 (six) hours as needed for moderate pain.     albuterol  (VENTOLIN  HFA) 108 (90 Base) MCG/ACT inhaler Inhale 2 puffs into the lungs every 6 (six) hours as needed for wheezing or shortness of breath. 6.7 g 0   apixaban (ELIQUIS) 5 MG TABS tablet Take 5 mg by mouth.     cyclobenzaprine (FLEXERIL) 10 MG tablet Take 10 mg by mouth 3 (three) times daily.     diltiazem  (CARDIZEM ) 30 MG tablet Take 0.5 tablets (15 mg total) by mouth 3 (three) times daily as needed (tachycardia or anxiety). 90 tablet 0   fluticasone -salmeterol (ADVAIR DISKUS) 250-50 MCG/ACT AEPB Inhale 1 puff into the lungs in the morning and at bedtime. 60 each 11   magnesium  oxide (MAG-OX) 400 (240 Mg) MG tablet Take 1 tablet by mouth daily.     metoprolol  tartrate (LOPRESSOR ) 25 MG tablet Take 12.5 mg by mouth.     miconazole (MICOTIN) 2 % powder Apply 1 Application topically.     nystatin  (MYCOSTATIN ) 100000 UNIT/ML suspension Take 5 mLs (500,000 Units total) by mouth 4 (four) times daily. 60 mL 0   omeprazole (PRILOSEC) 20 MG capsule Take by mouth.     oxyCODONE -acetaminophen  (PERCOCET/ROXICET) 5-325 MG tablet Take 1 tablet by mouth every 4 (four) hours as needed.     polyvinyl alcohol (LIQUIFILM TEARS) 1.4 % ophthalmic solution Apply 1 drop to eye.     risperiDONE (  RISPERDAL) 0.5 MG tablet Take 0.5 mg by mouth.     Simethicone  250 MG CAPS Take by mouth.     umeclidinium bromide  (INCRUSE ELLIPTA ) 62.5 MCG/ACT AEPB Inhale 1 puff into the lungs daily. 30 each 3   Vitamin D, Ergocalciferol, (DRISDOL) 1.25 MG (50000 UNIT) CAPS capsule Take 1 capsule by mouth once a week.     carvedilol (COREG) 6.25 MG tablet SMARTSIG:1 Tablet(s) By Mouth Morning-Evening     cefdinir (OMNICEF) 300 MG capsule Take by mouth.     clonazePAM  (KLONOPIN ) 1 MG tablet Take 1 tablet (1 mg total) by mouth 2 (two) times daily as needed. 30 tablet 0   FLUoxetine  (PROZAC ) 20 MG tablet Take 20 mg by mouth daily.     gabapentin   (NEURONTIN ) 300 MG capsule TAKE 1 CAPSULE(300 MG) BY MOUTH THREE TIMES DAILY AS NEEDED FOR BACK PAIN 90 capsule 0   levofloxacin (LEVAQUIN) 500 MG tablet Take 500 mg by mouth daily.     predniSONE  (DELTASONE ) 10 MG tablet Take 20 mg by mouth 2 (two) times daily.     No facility-administered medications prior to visit.    Family History  Problem Relation Age of Onset   Alpha-1 antitrypsin deficiency Mother    Emphysema Mother    Asthma Mother     Social History   Socioeconomic History   Marital status: Single    Spouse name: Not on file   Number of children: Not on file   Years of education: Not on file   Highest education level: Not on file  Occupational History   Occupation: N/A  Tobacco Use   Smoking status: Former    Current packs/day: 0.00    Average packs/day: 0.3 packs/day for 12.0 years (3.0 ttl pk-yrs)    Types: Cigarettes    Quit date: 10/2022    Years since quitting: 0.7    Passive exposure: Never   Smokeless tobacco: Never   Tobacco comments:    Smokes 2 cigarettes 2 days ago.  Trying to stop.  10/2021 hfb  Vaping Use   Vaping status: Never Used  Substance and Sexual Activity   Alcohol use: No   Drug use: No    Types: Oxycodone    Sexual activity: Not Currently  Other Topics Concern   Not on file  Social History Narrative   Not on file   Social Drivers of Health   Financial Resource Strain: Not on file  Food Insecurity: Low Risk  (06/01/2023)   Received from [facility]   Hunger Vital Sign    Worried About Running Out of Food in the Last Year: Never true    Ran Out of Food in the Last Year: Never true  Transportation Needs: No Transportation Needs (06/01/2023)   Received from [facility]   PRAPARE - Transportation    Lack of Transportation (Medical): No    Lack of Transportation (Non-Medical): No  Physical Activity: Not on file  Stress: Not on file  Social Connections: Unknown (07/08/2021)   Received from [facility], [facility]   Social  Network    Social Network: Not on file  Intimate Partner Violence: Not At Risk (04/09/2023)   Humiliation, Afraid, Rape, and Kick questionnaire    Fear of Current or Ex-Partner: No    Emotionally Abused: No    Physically Abused: No    Sexually Abused: No  Objective:  Physical Exam: BP 110/70 (BP Location: Left Arm, Patient Position: Sitting, Cuff Size: Normal)   Pulse 70   Temp 98.6 F (37 C) (Temporal)   Ht 6\' 1"  (1.854 m)   Wt 134 lb 9.6 oz (61.1 kg)   SpO2 100%   BMI 17.76 kg/m     Physical Exam Constitutional:      Appearance: He is cachectic.     Interventions: Nasal cannula in place.     Comments: Baseline 4 to 5 L of oxygen  HENT:     Head: Normocephalic and atraumatic.     Right Ear: Hearing normal.     Left Ear: Hearing normal.     Nose: Nose normal.  Eyes:     General: No scleral icterus.       Right eye: No discharge.        Left eye: No discharge.     Extraocular Movements: Extraocular movements intact.  Cardiovascular:     Rate and Rhythm: Normal rate and regular rhythm.     Heart sounds: Normal heart sounds.  Pulmonary:     Effort: Pulmonary effort is normal. No respiratory distress.     Breath sounds: Decreased air movement present. No wheezing.  Abdominal:     Palpations: Abdomen is soft.     Tenderness: There is no abdominal tenderness.  Skin:    General: Skin is warm.     Findings: No rash.  Neurological:     General: No focal deficit present.     Mental Status: He is alert.     Cranial Nerves: No cranial nerve deficit.  Psychiatric:        Mood and Affect: Mood normal.        Behavior: Behavior normal. Behavior is cooperative.        Thought Content: Thought content normal.        Judgment: Judgment normal.     No results found.  No results found for this or any previous visit (from the past 2160 hours).      Carnell Christian,  MD, MS

## 2023-07-09 NOTE — Patient Instructions (Signed)
  VISIT SUMMARY: You had a follow-up appointment today after your recent hospitalization for pneumonia. We discussed your current medications, your ongoing health issues, and made some adjustments to your treatment plan to better manage your symptoms and overall well-being.  YOUR PLAN: -PNEUMONIA: Pneumonia is an infection that inflames the air sacs in one or both lungs. You are currently improving but your strength is not fully restored. Continue to rest and take care of yourself as you recover.  -CHRONIC OBSTRUCTIVE PULMONARY DISEASE (COPD): COPD is a chronic inflammatory lung disease that obstructs airflow from the lungs. Your breathing has improved after treatment with steroids. Continue to monitor your symptoms and use your medications as prescribed.  -CANDIDIASIS: Candidiasis is a fungal infection caused by Candida. It was likely caused by the steroid use during your pneumonia treatment and has been treated with Diflucan. No further action is needed at this time.  -BACK PAIN: Chronic back pain is ongoing pain in the back area. We will discontinue gabapentin  as it is not effective for you and start you on duloxetine for pain relief.  -DEPRESSION: Depression is a mood disorder that causes persistent feelings of sadness and loss of interest. We will gradually reduce your fluoxetine  dose from 10 mg to 5 mg and then discontinue it. We will start you on duloxetine 30 mg for 7 days, then increase to 60 mg which will help with both depression and pain relief.  -ANXIETY: Anxiety is a mental health disorder characterized by feelings of worry or fear. You will continue taking clonazepam  1 mg three times a day. We will refer you to social work to help connect you with psychiatric care and consider a referral to palliative care for additional support.  INSTRUCTIONS: Please follow up with social work to get connected with psychiatric care. We will also consider a referral to palliative care for additional  support. Continue to take your medications as prescribed and monitor your symptoms. If you experience any new or worsening symptoms, please contact our office.

## 2023-07-09 NOTE — Transitions of Care (Post Inpatient/ED Visit) (Signed)
   07/09/2023  Name: Brett Beck MRN: [id number] DOB: [birth year]  Today's TOC FU Call Status: Today's TOC FU Call Status:: Successful TOC FU Call Completed TOC FU Call Complete Date: 07/09/23 Patient's Name and Date of Birth confirmed.  Transition Care Management Follow-up Telephone Call Date of Discharge: 07/07/23 Name of Other ([facility]) Discharge Facility: [facility] Type of Discharge: Inpatient Admission How have you been since you were released from the hospital?: Better Any questions or concerns?: No  Items Reviewed: Did you receive and understand the discharge instructions provided?: Yes (Patient reports that he has a PCP hospital follow up today.  Denies wanting to go over instructions since he is going to see PCP today.  Patient reports that he would like to see a psychiartrist. Encouraged patietn to ask for a referral to SW.)    Patient declines to review discharge instructions as he will see PCP this afternoon.   No assessments completed. Patient states that he would like help finding a psychiatrist. Encouraged patient to ask for a SW referral. In basket message sent to MD today.  Encouraged patient to call me if needed.    Orpha Blade, RN, BSN, CEN [employer]- Transition of Care Team.  [facility] [phone]

## 2023-07-12 ENCOUNTER — Other Ambulatory Visit: Payer: Self-pay | Admitting: Family Medicine

## 2023-07-12 DIAGNOSIS — F331 Major depressive disorder, recurrent, moderate: Secondary | ICD-10-CM

## 2023-07-12 DIAGNOSIS — F411 Generalized anxiety disorder: Secondary | ICD-10-CM

## 2023-07-17 ENCOUNTER — Telehealth: Payer: Self-pay

## 2023-07-17 NOTE — Progress Notes (Signed)
 Complex Care Management Note Care Guide Note  07/17/2023 Name: Brett Beck MRN: [id number] DOB: [birth year]   Complex Care Management Outreach Attempts: An unsuccessful telephone outreach was attempted today to offer the patient information about available complex care management services.  Follow Up Plan:  Additional outreach attempts will be made to offer the patient complex care management information and services.   Encounter Outcome:  No Answer  [facility]  [facility], [facility] Care Management Assistant Direct Dial: [phone]  Fax: [phone]

## 2023-07-22 NOTE — Progress Notes (Signed)
 Complex Care Management Note Care Guide Note  07/22/2023 Name: Brett Beck MRN: [id number] DOB: [birth year]   Complex Care Management Outreach Attempts: A second unsuccessful outreach was attempted today to offer the patient with information about available complex care management services.  Follow Up Plan:  Additional outreach attempts will be made to offer the patient complex care management information and services.   Encounter Outcome:  No Answer  [facility]  [facility], [facility] Care Management Assistant Direct Dial: [phone]  Fax: [phone]

## 2023-07-24 NOTE — Progress Notes (Signed)
 Complex Care Management Note  Care Guide Note 07/24/2023 Name: [name] MRN: [id number] DOB: [birth year]  Brett Beck is a 48 y.o. year old male who sees Catheryn Cluck, MD for primary care. I reached out to [name] by phone today to offer complex care management services.  Mr. Luginbill was given information about Complex Care Management services today including:   The Complex Care Management services include support from the care team which includes your Nurse Care Manager, Clinical Social Worker, or Pharmacist.  The Complex Care Management team is here to help remove barriers to the health concerns and goals most important to you. Complex Care Management services are voluntary, and the patient may decline or stop services at any time by request to their care team member.   Complex Care Management Consent Status: Patient wishes to consider information provided and/or speak with a member of the care team before deciding to participate in complex care management services.   Follow up plan:  The care guide will reach out to the patient again over the next 7 days.  Encounter Outcome:  Patient Request to Call Back  [facility]  [facility], [facility] Health Care Management Assistant Direct Dial: [phone]  Fax: [phone]

## 2023-07-27 ENCOUNTER — Telehealth: Payer: Self-pay

## 2023-07-27 ENCOUNTER — Inpatient Hospital Stay: Payer: MEDICAID | Admitting: Primary Care

## 2023-07-27 NOTE — Transitions of Care (Post Inpatient/ED Visit) (Signed)
 07/27/2023  Name: Brett Beck MRN: [id number] DOB: 07-18-1975  Today's TOC FU Call Status: Today's TOC FU Call Status:: Successful TOC FU Call Completed TOC FU Call Complete Date: 07/27/23 Patient's Name and Date of Birth confirmed.  Transition Care Management Follow-up Telephone Call Date of Discharge: 07/24/23 Name of Other (Non-[facility]) Discharge Facility: [facility] Type of Discharge: Inpatient Admission Primary Inpatient Discharge Diagnosis:: Acute respiratory failure How have you been since you were released from the hospital?: Better (Feeling better today.  Reports breathing is normal today.  O2 sats 98-100%.  Currently on 6 liters nasal canula .) Any questions or concerns?: No  Items Reviewed: Did you receive and understand the discharge instructions provided?: Yes Medications obtained,verified, and reconciled?: Yes (Medications Reviewed) Any new allergies since your discharge?: No Dietary orders reviewed?: NA Do you have support at home?: Yes People in Home [RPT]: parent(s) Name of Support/Comfort Primary Source: Father Brett Beck  Medications Reviewed Today: Medications Reviewed Today     Reviewed by Vanetta Generous, RN (Registered Nurse) on 07/27/23 at 1048  Med List Status: <None>   Medication Order Taking? Sig Documenting Provider Last Dose Status Informant  acetaminophen  (TYLENOL ) 500 MG tablet [id number] Yes Take 500 mg by mouth every 6 (six) hours as needed for moderate pain. [provider] Taking Active Self  albuterol  (VENTOLIN  HFA) 108 (90 Base) MCG/ACT inhaler [id number]  Inhale 2 puffs into the lungs every 6 (six) hours as needed for wheezing or shortness of breath. Jerrlyn Morel, NP  Expired 04/17/23 2359   apixaban (ELIQUIS) 5 MG TABS tablet [id number] Yes Take 5 mg by mouth 2 (two) times daily. [provider] Taking Active   clonazePAM  (KLONOPIN ) 1 MG tablet [id number] Yes Take 1 tablet (1 mg total) by mouth 3 (three) times daily.  Catheryn Cluck, MD Taking Active   cyclobenzaprine (FLEXERIL) 10 MG tablet [id number] Yes Take 10 mg by mouth 3 (three) times daily. [provider] Taking Active   diltiazem  (CARDIZEM ) 30 MG tablet [id number]  Take 0.5 tablets (15 mg total) by mouth 3 (three) times daily as needed (tachycardia or anxiety). Catheryn Cluck, MD  Expired 07/19/23 2359            Med Note (ROSE, Sallye Lunz U   Mon Jul 27, 2023  9:59 AM) Takes 30 mg three times per day not as needed  DULoxetine  (CYMBALTA ) 30 MG capsule [id number] Yes Take 1 capsule (30 mg total) by mouth daily for 7 days, THEN 2 capsules (60 mg total) daily for 23 days. Catheryn Cluck, MD Taking Active   FLUoxetine  (PROZAC ) 10 MG tablet [id number] Yes TAKE 1 TABLET(10 MG) BY MOUTH DAILY FOR 7 DAYS THEN TAKE 1/2 TABLET(5 MG) BY MOUTH DAILY FOR 7 DAYS Catheryn Cluck, MD Taking Active   fluticasone -salmeterol (ADVAIR DISKUS) 250-50 MCG/ACT AEPB [id number] Yes Inhale 1 puff into the lungs in the morning and at bedtime. Gloriajean Large, MD Taking Active   magnesium  oxide (MAG-OX) 400 (240 Mg) MG tablet [id number] Yes Take 1 tablet by mouth daily. [provider] Taking Active   metoprolol  tartrate (LOPRESSOR ) 25 MG tablet [id number] Yes Take 12.5 mg by mouth. [provider] Taking Active   miconazole (MICOTIN) 2 % powder [id number] No Apply 1 Application topically.  Patient not taking: Reported on 07/27/2023   [provider] Not Taking Active   nystatin  (MYCOSTATIN ) 100000 UNIT/ML suspension [id number] Yes Take 5 mLs (500,000 Units total) by mouth 4 (four)  times daily. Jerrlyn Morel, NP Taking Active            Med Note Randye Buttner   Thu Apr 09, 2023  5:28 PM) As needed  omeprazole (PRILOSEC) 20 MG capsule [id number] Yes Take by mouth. [provider] Taking Active            Med Note Randye Buttner   Thu Apr 09, 2023  7:48 PM) Takes 20 mg every morning  oxyCODONE -acetaminophen  (PERCOCET/ROXICET)  5-325 MG tablet [id number] No Take 1 tablet by mouth every 4 (four) hours as needed.  Patient not taking: Reported on 07/27/2023   [provider] Not Taking Active   risperiDONE (RISPERDAL) 0.5 MG tablet [id number] Yes Take 0.5 mg by mouth. [provider] Taking Active            Med Note Randye Buttner   Thu Apr 09, 2023  7:51 PM) Risperdal 0.5 mg by mouth three times a day.  Simethicone  250 MG CAPS [id number] Yes Take by mouth. [provider] Taking Active            Med Note Randye Buttner   Thu Apr 09, 2023  5:30 PM) As needed  umeclidinium bromide  (INCRUSE ELLIPTA ) 62.5 MCG/ACT AEPB [id number] Yes Inhale 1 puff into the lungs daily. Macdonald Savoy, MD Taking Active   Vitamin D, Ergocalciferol, (DRISDOL) 1.25 MG (50000 UNIT) CAPS capsule [id number] Yes Take 1 capsule by mouth once a week. [provider] Taking Active             Home Care and Equipment/Supplies: Were Home Health Services Ordered?: No Any new equipment or medical supplies ordered?: Yes Name of Medical supply agency?: [employer] to come work on changing CPAP to a non invasive vent.   BSC Were you able to get the equipment/medical supplies?: No ([employer] did not deliver [facility] to hospital room prior to discharge.) Do you have any questions related to the use of the equipment/supplies?: Yes What questions do you have?: BSC-  Functional Questionnaire: Do you need assistance with bathing/showering or dressing?: No Do you need assistance with meal preparation?: No Do you need assistance with eating?: No Do you have difficulty maintaining continence: Yes (Due to shortness of breath an not being able to get to the bathroom) Do you need assistance with getting out of bed/getting out of a chair/moving?: No Do you have difficulty managing or taking your medications?: No  Follow up appointments reviewed: PCP Follow-up appointment confirmed?: No (Will call today and schedule) MD Provider  Line Number:[phone] Given: No Specialist Hospital Follow-up appointment confirmed?: Yes Date of Specialist follow-up appointment?: 07/29/23 Follow-Up Specialty Provider:: Estanislao Heimlich Do you need transportation to your follow-up appointment?: No Do you understand care options if your condition(s) worsen?: Yes-patient verbalized understanding  SDOH Interventions Today    Flowsheet Row Most Recent Value  SDOH Interventions   Food Insecurity Interventions Intervention Not Indicated  Housing Interventions Intervention Not Indicated  Transportation Interventions Intervention Not Indicated  Utilities Interventions Intervention Not Indicated  Depression Interventions/Treatment  Medication, Currently on Treatment  [Met with a therapist 2 weeks ago. Has a psych appointment this week.]      Reviewed with patient 9 admission in 6 months.  Patient is supposed to have CPAP changed over Non invasive ventilation at home.  Patient has spoke with his [employer] Representative and this will happen this week.  Patient has a new patient pulmonary appointment this week.  Patient is currently feeling well. Patient reports  that he was ordered a BSC and never received it. I called Jermaine with [employer] and he will ensure that BCS is delivered to patients home.  Patient reports that he has high depression and anxiety.  Positive depression and anxiety screening. Reported to MD.  Denies being suicidal.  Patient denies smoking since 12/2022. Patient has completed his prednisone  at this time.  All medications reviewed.   Patient not eligible for 30 day program due to [facility].   Call Placed to [employer] to report discharge. Spoke with Moira Andrews who states patient Care manager was out sourced to [employer]  Case mangers name is [facility]  [phone]  extension [phone].  Placed call, spoke with Chana Comas and was told to call Finis Hugger [phone]  or Hortensia Ma  [phone].  Placed call to [employer]. Unidentified VM and  left a message for someone to call me back.  Placed call to [facility] and left a VM requesting a call back.   I received VM from [facility], returned call with no answer. Left a detailed VM with members information and date of discharge in additions to history of 9 admissions since Jan 2025.   Encouraged his Case manager to call the patient.    Orpha Blade, RN, BSN, CEN [employer]- Transition of Care Team.  [facility] [phone]

## 2023-07-29 ENCOUNTER — Telehealth: Payer: Self-pay

## 2023-07-29 ENCOUNTER — Ambulatory Visit: Payer: MEDICAID | Admitting: Primary Care

## 2023-07-29 VITALS — HR 105 | Ht 73.0 in | Wt 134.0 lb

## 2023-07-29 DIAGNOSIS — J441 Chronic obstructive pulmonary disease with (acute) exacerbation: Secondary | ICD-10-CM

## 2023-07-29 DIAGNOSIS — J449 Chronic obstructive pulmonary disease, unspecified: Secondary | ICD-10-CM | POA: Diagnosis not present

## 2023-07-29 DIAGNOSIS — F17210 Nicotine dependence, cigarettes, uncomplicated: Secondary | ICD-10-CM

## 2023-07-29 MED ORDER — PREDNISONE 10 MG PO TABS: ORAL_TABLET | ORAL | 0 refills | Status: DC

## 2023-07-29 NOTE — Telephone Encounter (Signed)
 Forwarding message below. Please advise. RX request for prednisone  40mg .   ED visit on 07/22/2023 FOV 08/04/2023 ( follow up)

## 2023-07-29 NOTE — Telephone Encounter (Signed)
 Copied from CRM [id number]. Topic: Clinical - Medication Question >> Jul 28, 2023 11:33 AM [place] J wrote: Reason for CRM: Patient was released from the hospital on Saturday and was prescribed prednisone  40mg  for his COPD and is out of medication. He was wondering if Dr. Hildy Lowers would be willing to prescribe 3 days worth of prednisone  40mg  since that's when he will see his pulmonologist for a follow-up. >> Jul 28, 2023  3:08 PM Allyne Areola wrote: Patient is calling to follow up on this request. Advised that there may be a turn around time but I will advised that it is urgent.

## 2023-07-29 NOTE — Progress Notes (Signed)
 @Patient  ID: Brett Beck, male    DOB: 1975-07-13, 49 y.o.   MRN: [id number]  No chief complaint on file.   Referring provider: Sebastian Beverley NOVAK, MD  HPI: 48 year old male, current every day smoker.  Past medical history significant for COPD, bullous emphysema, pneumonia, substance abuse, major depressive disorder, tobacco abuse.  Hospital course 07/19/21-07/25/21 Discharge Diagnoses:  Principal Problem:   PNA (pneumonia) Active Problems:   Depression   Emphysema lung (HCC)   COPD with acute exacerbation (HCC)   Tobacco abuse   Protein-calorie malnutrition, severe (HCC)   Acute respiratory failure with hypoxia secondary to community-acquired pneumonia with superimposed acute COPD exacerbation: He was started empirically on IV Rocephin  and azithromycin  and completed his treatment in house. Culture data remain negative till date. He had hemoptysis on 07/22/2021 chest x-ray showed no infiltrate pulmonary was consulted who recommended a CT angio to rule out a PE which was negative for PE.  It showed right upper lobe pneumonia. Pulmonary recommended to continue steroids Profilnine inhalers given 2 doses and IV Lasix  and was weaned to room air. Pulmonary will follow up as an outpatient .   Bilateral lower extremity edema: 2D echo showed normal systolic function elevated pressures. He was given IV Lasix .  We will follow-up with pulmonary as an outpatient.  08/02/2021- Hospital fu  Patient presents today for hospital follow-up. Admitted from 07/19/21-07/25/21 for CAP. Consulted in patient by Dr. Gladis. Patient developed hemoptysis on 07/22/21, CXR showed no infiltrate. CTA negative for PE, showed right upper lobe pneumonia. Pulmonary recommended continuing steroids. He is feeling significantly better today. Breathing has improved. He has some residual weakness. He is newly on Dulera  and Incruse. Prescriptions were filled by [facility] and he received 1 month supply. He had previously been on  Symbicort  several years ago. Inhaler was costing him over 300 dollars. He tells me that he was told that he was approved for mediaid during most recent hospital stay.  Patient will need repeat imaging in about 4 to 6 weeks, he is concerned about cost of CT imaging.       07/29/2023- INTERIM  Patient presents today for acute OV. He was last seen by our office in 2023. Former Dr. Shellia patient.   PMH significant for Alpha-1 antitrypsin disease with severe COPD, multiple frequent exacerbations requiring intubation due to somnolence and hypercapnia who presents due to 1 day history of worsening shortness of breath and hypoxia to 87% at home   Former smoker quit November 2024  He has been in the hospital 9 times since January 2025 Chest x-ray showing severe bullous emphysema similar to prior  Discharged on 40mg  x 3 days, typically does better on taper He is feeling better for the most part since discharge Chronic dyspnea. No current cough or sputum production  Maintaince regimen includes Advair and Incruse  O2 dependent on 6L  He has a nebulizer machine  He has a CPAP    Allergies  Allergen Reactions   Effexor [Venlafaxine] Diarrhea   Hydrocodone-Acetaminophen      REACTION: severe headaches   Ibuprofen  Other (See Comments)    Severe stomach issues   Lovenox  [Enoxaparin  Sodium] Other (See Comments)    Reports severe brusing   Lyrica [Pregabalin] Diarrhea    Swelling in the feet   Nsaids Other (See Comments)    Severe stomach issues   Quetiapine  Other (See Comments)    Drowsy but anxious ,starts to lose vision   Tramadol Hcl  REACTION: diarrhea, abdominal pain   Tramadol Hcl Nausea And Vomiting    REACTION: diarrhea, abdominal pain   Trazodone  Other (See Comments)    Night terrors   Enoxaparin  Other (See Comments)    Bruising    Immunization History  Administered Date(s) Administered   PFIZER(Purple Top)SARS-COV-2 Vaccination 05/25/2019, 06/15/2019    Past Medical  History:  Diagnosis Date   Chronic back pain    Degenerative disc disease    Emphysema/COPD (HCC)    Opiate abuse, continuous (HCC)    Pneumothorax on left     Tobacco History: Social History   Tobacco Use  Smoking Status Former   Current packs/day: 0.00   Average packs/day: 0.3 packs/day for 12.0 years (3.0 ttl pk-yrs)   Types: Cigarettes   Quit date: 10/2022   Years since quitting: 0.7   Passive exposure: Never  Smokeless Tobacco Never  Tobacco Comments   Smokes 2 cigarettes 2 days ago.  Trying to stop.  10/2021 hfb   Counseling given: Not Answered Tobacco comments: Smokes 2 cigarettes 2 days ago.  Trying to stop.  10/2021 hfb   Outpatient Medications Prior to Visit  Medication Sig Dispense Refill   acetaminophen  (TYLENOL ) 500 MG tablet Take 500 mg by mouth every 6 (six) hours as needed for moderate pain.     albuterol  (VENTOLIN  HFA) 108 (90 Base) MCG/ACT inhaler Inhale 2 puffs into the lungs every 6 (six) hours as needed for wheezing or shortness of breath. 6.7 g 0   apixaban (ELIQUIS) 5 MG TABS tablet Take 5 mg by mouth 2 (two) times daily.     clonazePAM  (KLONOPIN ) 1 MG tablet Take 1 tablet (1 mg total) by mouth 3 (three) times daily. 270 tablet 1   cyclobenzaprine (FLEXERIL) 10 MG tablet Take 10 mg by mouth 3 (three) times daily.     diltiazem  (CARDIZEM ) 30 MG tablet Take 0.5 tablets (15 mg total) by mouth 3 (three) times daily as needed (tachycardia or anxiety). 90 tablet 0   DULoxetine  (CYMBALTA ) 30 MG capsule Take 1 capsule (30 mg total) by mouth daily for 7 days, THEN 2 capsules (60 mg total) daily for 23 days. 53 capsule 0   FLUoxetine  (PROZAC ) 10 MG tablet TAKE 1 TABLET(10 MG) BY MOUTH DAILY FOR 7 DAYS THEN TAKE 1/2 TABLET(5 MG) BY MOUTH DAILY FOR 7 DAYS 11 tablet 0   fluticasone -salmeterol (ADVAIR DISKUS) 250-50 MCG/ACT AEPB Inhale 1 puff into the lungs in the morning and at bedtime. 60 each 11   magnesium  oxide (MAG-OX) 400 (240 Mg) MG tablet Take 1 tablet by mouth  daily.     metoprolol  tartrate (LOPRESSOR ) 25 MG tablet Take 12.5 mg by mouth.     miconazole (MICOTIN) 2 % powder Apply 1 Application topically. (Patient not taking: Reported on 07/27/2023)     nystatin  (MYCOSTATIN ) 100000 UNIT/ML suspension Take 5 mLs (500,000 Units total) by mouth 4 (four) times daily. 60 mL 0   omeprazole (PRILOSEC) 20 MG capsule Take by mouth.     oxyCODONE -acetaminophen  (PERCOCET/ROXICET) 5-325 MG tablet Take 1 tablet by mouth every 4 (four) hours as needed. (Patient not taking: Reported on 07/27/2023)     risperiDONE  (RISPERDAL ) 0.5 MG tablet Take 0.5 mg by mouth.     Simethicone  250 MG CAPS Take by mouth.     umeclidinium bromide  (INCRUSE ELLIPTA ) 62.5 MCG/ACT AEPB Inhale 1 puff into the lungs daily. 30 each 3   Vitamin D, Ergocalciferol, (DRISDOL) 1.25 MG (50000 UNIT) CAPS capsule Take 1 capsule  by mouth once a week.     No facility-administered medications prior to visit.      Review of Systems  Review of Systems  Respiratory:  Positive for shortness of breath. Negative for cough and wheezing.     Physical Exam  There were no vitals taken for this visit. Physical Exam Constitutional:      Appearance: Normal appearance.  HENT:     Head: Normocephalic and atraumatic.  Cardiovascular:     Rate and Rhythm: Normal rate and regular rhythm.  Pulmonary:     Effort: Pulmonary effort is normal.     Breath sounds: Normal breath sounds.  Neurological:     General: No focal deficit present.     Mental Status: He is alert and oriented to person, place, and time. Mental status is at baseline.  Psychiatric:        Mood and Affect: Mood normal.        Behavior: Behavior normal.        Thought Content: Thought content normal.        Judgment: Judgment normal.      Lab Results:  CBC    Component Value Date/Time   WBC 10.8 (H) 06/18/2022 0440   RBC 4.16 (L) 06/18/2022 0440   HGB 13.8 06/18/2022 0440   HGB 12.4 (L) 08/01/2021 1536   HCT 42.0 06/18/2022 0440    HCT 35.9 (L) 08/01/2021 1536   PLT 460 (H) 06/18/2022 0440   PLT 420 08/01/2021 1536   MCV 101.0 (H) 06/18/2022 0440   MCV 95 08/01/2021 1536   MCV 95 04/03/2014 1039   MCH 33.2 06/18/2022 0440   MCHC 32.9 06/18/2022 0440   RDW 13.4 06/18/2022 0440   RDW 12.7 08/01/2021 1536   RDW 14.0 04/03/2014 1039   LYMPHSABS 0.4 (L) 07/19/2021 1417   LYMPHSABS 1.2 04/03/2014 1039   MONOABS 1.0 07/19/2021 1417   MONOABS 0.6 04/03/2014 1039   EOSABS 0.0 07/19/2021 1417   EOSABS 0.0 04/03/2014 1039   BASOSABS 0.0 07/19/2021 1417   BASOSABS 0.1 04/03/2014 1039    BMET    Component Value Date/Time   NA 135 06/18/2022 0440   NA 141 08/01/2021 1536   NA 142 04/03/2014 1039   K 3.8 06/18/2022 0440   K 3.8 04/03/2014 1039   CL 93 (L) 06/18/2022 0440   CL 106 04/03/2014 1039   CO2 32 06/18/2022 0440   CO2 28 04/03/2014 1039   GLUCOSE 117 (H) 06/18/2022 0440   GLUCOSE 121 (H) 04/03/2014 1039   BUN 12 06/18/2022 0440   BUN 12 08/01/2021 1536   BUN 11 04/03/2014 1039   CREATININE 0.77 06/18/2022 0440   CREATININE 1.14 04/03/2014 1039   CALCIUM 9.3 06/18/2022 0440   CALCIUM 9.4 04/03/2014 1039   GFRNONAA >60 06/18/2022 0440   GFRNONAA >60 04/03/2014 1039   GFRAA >60 05/26/2016 2343   GFRAA >60 04/03/2014 1039    BNP    Component Value Date/Time   BNP 185.8 (H) 06/18/2022 0440    ProBNP No results found for: PROBNP  Imaging: No results found.   Assessment & Plan:   1. Chronic obstructive pulmonary disease, unspecified COPD type (HCC) (Primary)  Severe COPD with chronic respiratory failure - Stop Advair and Incruse - Start Trelegy sample x 2 weeks - Continue Albuterol  every 6 hours as needed for breakthrough shortness of breath  - Start Ohtuvayre  nebulizer twice daily- paperwork filled out. If not covered we will try Daliresp  - Continue  supplemental O2 to maintain O2 >88-90% - Take prednisone  taper as directed  Follow-up Please establish patient with either Dr. Shelah,  Dr. Annella or Dr. Kara for follow-up in 4 to 8 weeks (new patient 30 minutes slot)   Almarie LELON Ferrari, NP 07/29/2023

## 2023-07-29 NOTE — Patient Instructions (Signed)
-   Stop Advair and Incruse - Start Trelegy sample x 2 weeks - Continue Albuterol  every 6 hours as needed for breakthrough shortness of breath  - Start Ohtuvayre  nebulizer twice daily- paperwork filled out, will come form specialty pharmacy. If not covered we will try Daliresp  - Continue supplemental O2 to maintain O2 >88-90% - Take prednisone  taper as directed  Follow-up Please establish patient with either Dr. Baldwin Levee, Dr. Marygrace Snellen or Dr. Diania Fortes for follow-up in 4 to 8 weeks (new patient 30 minutes slot)

## 2023-07-30 ENCOUNTER — Telehealth: Payer: Self-pay | Admitting: Pulmonary Disease

## 2023-07-30 MED ORDER — PREDNISONE 20 MG PO TABS: 40.0000 mg | ORAL_TABLET | Freq: Every day | ORAL | 0 refills | Status: AC

## 2023-07-30 NOTE — Telephone Encounter (Signed)
 Left voice message to schedule apt for September 02 2023 at 1030 with Dr. Diania Fortes.

## 2023-07-31 NOTE — Progress Notes (Signed)
 Complex Care Management Note  Care Guide Note 07/31/2023 Name: EDUIN FRIEDEL MRN: [id number] DOB: 1975/11/20  LENNELL SHANKS is a 48 y.o. year old male who sees Catheryn Cluck, MD for primary care. I reached out to [name] by phone today to offer complex care management services.  Mr. Anspach was given information about Complex Care Management services today including:   The Complex Care Management services include support from the care team which includes your Nurse Care Manager, Clinical Social Worker, or Pharmacist.  The Complex Care Management team is here to help remove barriers to the health concerns and goals most important to you. Complex Care Management services are voluntary, and the patient may decline or stop services at any time by request to their care team member.   Complex Care Management Consent Status: Patient did not agree to participate in complex care management services at this time.  Follow up plan:  Patient scheduled with specialty offices and will follow up with PCP.   Encounter Outcome:  Patient Refused  Cayetano Coco [facility], [facility] Health Care Management Assistant Direct Dial: [phone]  Fax: [phone]

## 2023-08-04 ENCOUNTER — Encounter: Payer: Self-pay | Admitting: Family Medicine

## 2023-08-04 ENCOUNTER — Ambulatory Visit: Payer: MEDICAID | Admitting: Family Medicine

## 2023-08-04 VITALS — BP 110/70 | HR 113 | Temp 97.8°F | Ht 73.0 in | Wt 146.7 lb

## 2023-08-04 DIAGNOSIS — F411 Generalized anxiety disorder: Secondary | ICD-10-CM | POA: Diagnosis not present

## 2023-08-04 DIAGNOSIS — I471 Supraventricular tachycardia, unspecified: Secondary | ICD-10-CM

## 2023-08-04 DIAGNOSIS — E8801 Alpha-1-antitrypsin deficiency: Secondary | ICD-10-CM

## 2023-08-04 DIAGNOSIS — J431 Panlobular emphysema: Secondary | ICD-10-CM

## 2023-08-04 DIAGNOSIS — J441 Chronic obstructive pulmonary disease with (acute) exacerbation: Secondary | ICD-10-CM

## 2023-08-04 DIAGNOSIS — R600 Localized edema: Secondary | ICD-10-CM

## 2023-08-04 DIAGNOSIS — F339 Major depressive disorder, recurrent, unspecified: Secondary | ICD-10-CM

## 2023-08-04 DIAGNOSIS — Z09 Encounter for follow-up examination after completed treatment for conditions other than malignant neoplasm: Secondary | ICD-10-CM

## 2023-08-04 MED ORDER — FUROSEMIDE 20 MG PO TABS: 20.0000 mg | ORAL_TABLET | Freq: Every day | ORAL | 3 refills | Status: DC | PRN

## 2023-08-04 MED ORDER — METOPROLOL TARTRATE 25 MG PO TABS: 25.0000 mg | ORAL_TABLET | Freq: Two times a day (BID) | ORAL | 3 refills | Status: DC

## 2023-08-04 MED ORDER — PREDNISONE 5 MG PO TABS: ORAL_TABLET | ORAL | 0 refills | Status: AC

## 2023-08-04 MED ORDER — DILTIAZEM HCL 30 MG PO TABS
30.0000 mg | ORAL_TABLET | Freq: Three times a day (TID) | ORAL | 3 refills | Status: DC | PRN
Start: 2023-08-04 — End: 2023-09-10

## 2023-08-04 MED ORDER — RISPERIDONE 0.5 MG PO TABS: 0.5000 mg | ORAL_TABLET | Freq: Three times a day (TID) | ORAL | 3 refills | Status: DC

## 2023-08-04 NOTE — Patient Instructions (Signed)
  VISIT SUMMARY: You were seen today for a follow-up after your recent hospitalization for a COPD exacerbation. We discussed your current treatment plan, medication adjustments, and addressed your concerns about prednisone  tapering, leg swelling, and anxiety management.  YOUR PLAN: -COPD EXACERBATION WITH RESPIRATORY FAILURE: You experienced a worsening of your COPD, which led to difficulty breathing and required hospitalization. We will taper your prednisone  more gradually: 30 mg for 3 days, 20 mg for 3 days, 10 mg for 3 days, then stop. Continue using your current inhalers and monitor your oxygen therapy at 6 liters via nasal cannula.  -PERIPHERAL EDEMA: You have swelling in your legs, which may be worsened by prednisone . We will prescribe Lasix  20 mg as needed for the swelling. Additionally, elevate your legs and consider using compression stockings.  -MEDICATION MANAGEMENT ISSUES: There were discrepancies in your medication doses. We have updated your prescriptions: diltiazem  30 mg three times daily, metoprolol  25 mg once daily, and Risperdal 0.5 mg three times daily.  -ANXIETY AND BENZODIAZEPINE USE DISORDER: You have severe anxiety and a history of benzodiazepine use disorder. We will refer you to social work to help you find psychiatric care and manage your anxiety long-term.  -GOALS OF CARE: We discussed the possibility of palliative care to help manage your frequent hospitalizations and improve your quality of life. We will continue this discussion in future visits.  INSTRUCTIONS: Please follow up in 1-2 weeks to assess your response to Lasix  and your overall condition. Ensure you continue with your updated medication regimen and monitor your symptoms closely. A printout of your updated medication list and plans will be provided.

## 2023-08-04 NOTE — Progress Notes (Signed)
 Assessment & Plan   Assessment/Plan:    Assessment & Plan COPD exacerbation with respiratory failure   He is experiencing an acute exacerbation of COPD with respiratory failure, managed with high-dose steroids, BiPAP, and continuous nebulizers. He has severe COPD with frequent exacerbations and alpha-1 antitrypsin deficiency. Current management involves tapering prednisone  to prevent abrupt cessation effects, as he responds better to gradual tapering. Taper prednisone : 30 mg for 3 days, 20 mg for 3 days, 10 mg for 3 days,5 mg for 3 days then stop. Continue current inhaler regimen as per pulmonology recommendations. Monitor oxygen therapy at 6 liters via nasal cannula.  Peripheral edema   He has leg swelling, possibly exacerbated by prednisone , and mild tachycardia with a heart rate of 113 bpm. Monitoring fluid balance is advised due to sensitivity to acid-base changes. Cautious use of Lasix  is recommended. Prescribe Lasix  20 mg as needed for leg swelling. Advise on leg elevation and use of compression stockings.  Medication management issues   There are discrepancies in medication dosages for diltiazem , metoprolol , and Risperdal. He prefers short-acting diltiazem  for better control of heart rate and anxiety. Adjust metoprolol  dosage for ease of administration. Update diltiazem  to 30 mg three times daily. Prescribe metoprolol  25 mg tablet once daily. Adjust Risperdal to 0.5 mg three times daily.  Anxiety and benzodiazepine use disorder   He has severe anxiety with benzodiazepine use disorder. Short-term benzodiazepine use is for palliative purposes, but long-term management requires psychiatric intervention. Difficulty securing psychiatric care due to insurance issues. Refer to social work for assistance with psychiatric placement. Facilitate urgent follow-up with the social work team.  Goals of Care   There is a discussion about potential palliative care assistance for long-term management and  comfort. Frequent hospital admissions may benefit from palliative support. He is open to discussing palliative care options to manage frequent hospitalizations and improve quality of life. Discuss palliative care options in future visits.  Follow-up   Ensure continuity of care and monitor response to medication adjustments and management plans. Schedule a follow-up appointment in 1-2 weeks to assess response to Lasix  and overall condition. Provide a printout of updated medication list and plans.      Medications Discontinued During This Encounter  Medication Reason   FLUoxetine  (PROZAC ) 10 MG tablet Completed Course   fluticasone -salmeterol (ADVAIR DISKUS) 250-50 MCG/ACT AEPB Change in therapy   oxyCODONE -acetaminophen  (PERCOCET/ROXICET) 5-325 MG tablet Completed Course   umeclidinium bromide  (INCRUSE ELLIPTA ) 62.5 MCG/ACT AEPB Change in therapy   magnesium  oxide (MAG-OX) 400 (240 Mg) MG tablet Change in therapy   risperiDONE (RISPERDAL) 0.5 MG tablet Reorder   diltiazem  (CARDIZEM ) 30 MG tablet    metoprolol  tartrate (LOPRESSOR ) 25 MG tablet Reorder    Return if symptoms worsen or fail to improve.        Subjective:   Encounter date: 08/04/2023  Brett Beck is a 48 y.o. male who has ANXIETY; Depression; ALLERGIC RHINITIS; PAIN IN JOINT, MULTIPLE SITES; BACK PAIN, LUMBAR, CHRONIC; HEADACHE; DIARRHEA, CHRONIC; Emphysema lung (HCC); Atypical chest pain; Bullous emphysema (HCC); Moderate benzodiazepine use disorder (HCC); Opioid use with withdrawal (HCC); Substance induced mood disorder (HCC); GAD (generalized anxiety disorder); Opiate withdrawal (HCC); Anxiety; Major depressive disorder, recurrent severe without psychotic features (HCC); MDD (major depressive disorder), recurrent severe, without psychosis (HCC); HCAP (healthcare-associated pneumonia); COPD with acute exacerbation (HCC); Tobacco abuse; Protein-calorie malnutrition, severe (HCC); Edema; Hospital discharge follow-up;  Other fatigue; Paroxysmal supraventricular tachycardia (HCC); Hypotension due to drugs; and Atrial enlargement, bilateral on their problem  list..   He  has a past medical history of Chronic back pain, Degenerative disc disease, Emphysema/COPD (HCC), Opiate abuse, continuous (HCC), and Pneumothorax on left.Brett Beck   He presents with chief complaint of Follow-up (Recent discharge from hospital. Discuss medications especially Lasix  ) .   Discussed the use of AI scribe software for clinical note transcription with the patient, who gave verbal consent to proceed.  History of Present Illness Brett Beck is a 48 year old male with severe COPD and alpha-1 antitrypsin deficiency who presents for hospital follow-up after a recent exacerbation.  He was admitted to [facility] on Jul 22, 2023, for acute respiratory failure secondary to a COPD exacerbation. During the hospital stay, he received high-dose steroids, BiPAP, and continuous DuoNeb nebulizers. Antibiotics were not administered. His condition improved, and he was discharged on Jul 24, 2023, with a five-day course of prednisone .  He had a follow-up with pulmonology on July 29, 2023, where his Advair and Incruse were discontinued, and hematology was initiated. He continued on albuterol  and started Otovera nebulizer twice daily. His prednisone  was tapered, but he is concerned about the abruptness of the taper and prefers a more gradual schedule.  He is currently on continuous nasal cannula oxygen at 5 to 6 liters. He experiences leg swelling, particularly when on prednisone , and has requested Lasix  for this issue.  He is on multiple medications, including diltiazem  30 mg three times a day, metoprolol  25 mg once daily, and Risperdal 0.5 mg three times a day. He reports discrepancies in his medication doses and requests adjustments to align with his current regimen.  He has severe anxiety and a history of benzodiazepine and opioid use  disorder. Recently, he encountered issues with insurance coverage for psychiatric care, delaying access to long-term management for his anxiety and stress. He is seeking assistance with referrals to appropriate psychiatric services.     ROS  Past Surgical History:  Procedure Laterality Date   lung inflation     left lung   LUNG SURGERY     Dr Percy Bracken-- left lung   NOSE SURGERY     broken nose    Outpatient Medications Prior to Visit  Medication Sig Dispense Refill   acetaminophen  (TYLENOL ) 500 MG tablet Take 500 mg by mouth every 6 (six) hours as needed for moderate pain.     albuterol  (VENTOLIN  HFA) 108 (90 Base) MCG/ACT inhaler Inhale 2 puffs into the lungs every 6 (six) hours as needed for wheezing or shortness of breath. 6.7 g 0   apixaban (ELIQUIS) 5 MG TABS tablet Take 5 mg by mouth 2 (two) times daily.     clonazePAM  (KLONOPIN ) 1 MG tablet Take 1 tablet (1 mg total) by mouth 3 (three) times daily. 270 tablet 1   cyclobenzaprine (FLEXERIL) 10 MG tablet Take 10 mg by mouth 3 (three) times daily.     DULoxetine  (CYMBALTA ) 30 MG capsule Take 1 capsule (30 mg total) by mouth daily for 7 days, THEN 2 capsules (60 mg total) daily for 23 days. 53 capsule 0   Fluticasone -Umeclidin-Vilant (TRELEGY ELLIPTA  IN) Inhale 1 puff into the lungs daily.     nystatin  (MYCOSTATIN ) 100000 UNIT/ML suspension Take 5 mLs (500,000 Units total) by mouth 4 (four) times daily. 60 mL 0   omeprazole (PRILOSEC) 20 MG capsule Take by mouth.     Simethicone  250 MG CAPS Take by mouth.     Vitamin D, Ergocalciferol, (DRISDOL) 1.25 MG (50000 UNIT) CAPS capsule Take  1 capsule by mouth once a week.     metoprolol  tartrate (LOPRESSOR ) 25 MG tablet Take 25 mg by mouth 2 (two) times daily.     risperiDONE (RISPERDAL) 0.5 MG tablet Take 0.5 mg by mouth.     miconazole (MICOTIN) 2 % powder Apply 1 Application topically. (Patient not taking: Reported on 07/27/2023)     diltiazem  (CARDIZEM ) 30 MG tablet Take 0.5 tablets (15  mg total) by mouth 3 (three) times daily as needed (tachycardia or anxiety). (Patient not taking: Reported on 08/04/2023) 90 tablet 0   FLUoxetine  (PROZAC ) 10 MG tablet TAKE 1 TABLET(10 MG) BY MOUTH DAILY FOR 7 DAYS THEN TAKE 1/2 TABLET(5 MG) BY MOUTH DAILY FOR 7 DAYS (Patient not taking: Reported on 08/04/2023) 11 tablet 0   fluticasone -salmeterol (ADVAIR DISKUS) 250-50 MCG/ACT AEPB Inhale 1 puff into the lungs in the morning and at bedtime. (Patient not taking: Reported on 08/04/2023) 60 each 11   magnesium  oxide (MAG-OX) 400 (240 Mg) MG tablet Take 1 tablet by mouth daily. (Patient not taking: Reported on 08/04/2023)     oxyCODONE -acetaminophen  (PERCOCET/ROXICET) 5-325 MG tablet Take 1 tablet by mouth every 4 (four) hours as needed. (Patient not taking: Reported on 07/27/2023)     umeclidinium bromide  (INCRUSE ELLIPTA ) 62.5 MCG/ACT AEPB Inhale 1 puff into the lungs daily. (Patient not taking: Reported on 08/04/2023) 30 each 3   No facility-administered medications prior to visit.    Family History  Problem Relation Age of Onset   Alpha-1 antitrypsin deficiency Mother    Emphysema Mother    Asthma Mother     Social History   Socioeconomic History   Marital status: Single    Spouse name: Not on file   Number of children: Not on file   Years of education: Not on file   Highest education level: Not on file  Occupational History   Occupation: N/A  Tobacco Use   Smoking status: Former    Current packs/day: 0.00    Average packs/day: 0.3 packs/day for 12.0 years (3.0 ttl pk-yrs)    Types: Cigarettes    Quit date: 10/2022    Years since quitting: 0.7    Passive exposure: Never   Smokeless tobacco: Never   Tobacco comments:    Smokes 2 cigarettes 2 days ago.  Trying to stop.  10/2021 hfb  Vaping Use   Vaping status: Never Used  Substance and Sexual Activity   Alcohol use: No   Drug use: No    Types: Oxycodone    Sexual activity: Not Currently  Other Topics Concern   Not on file   Social History Narrative   Not on file   Social Drivers of Health   Financial Resource Strain: Not on file  Food Insecurity: No Food Insecurity (07/27/2023)   Hunger Vital Sign    Worried About Running Out of Food in the Last Year: Never true    Ran Out of Food in the Last Year: Never true  Transportation Needs: No Transportation Needs (07/27/2023)   PRAPARE - Administrator, Civil Service (Medical): No    Lack of Transportation (Non-Medical): No  Physical Activity: Not on file  Stress: Not on file  Social Connections: Unknown (07/08/2021)   Received from [facility], [facility]   Social Network    Social Network: Not on file  Intimate Partner Violence: Not At Risk (07/27/2023)   Humiliation, Afraid, Rape, and Kick questionnaire    Fear of Current or Ex-Partner: No  Emotionally Abused: No    Physically Abused: No    Sexually Abused: No                                                                                                  Objective:  Physical Exam: BP 110/70 (BP Location: Left Arm, Patient Position: Sitting)   Pulse (!) 113   Temp 97.8 F (36.6 C) (Temporal)   Ht 6\' 1"  (1.854 m)   Wt 146 lb 11.2 oz (66.5 kg)   SpO2 100% Comment: with O2  BMI 19.35 kg/m    Physical Exam VITALS: P- 113 GENERAL: Alert, cooperative, well developed, no acute distress, no respiratory distress, 6 L on O2 [place] HEENT: Normocephalic, normal oropharynx, moist mucous membranes CHEST: Clear to auscultation bilaterally, no wheezes, rhonchi, or crackles CARDIOVASCULAR: Mildly tachycardic at 113, S1 and S2 normal without murmurs ABDOMEN: Soft, non-tender, non-distended, without organomegaly, normal bowel sounds EXTREMITIES: 2+ pitting edema, no cyanosis NEUROLOGICAL: Cranial nerves grossly intact, moves all extremities without gross motor or sensory deficit   Physical Exam  No results found.  No results found for this or any previous visit (from the past 2160 hours).       Carnell Christian, MD, MS

## 2023-08-06 ENCOUNTER — Telehealth: Payer: Self-pay

## 2023-08-06 NOTE — Transitions of Care (Post Inpatient/ED Visit) (Signed)
 08/06/2023  Patient ID: Brett Beck, male   DOB: 09-28-1975, 48 y.o.   MRN: [id number]  Incoming call from [employer], respiratory supervisor, Brett Beck who reports that he will reach out to [facility] about orders for non invasive vent settings.  I was informed that the biggest problem is patient compliance.   Placed call back to patient to inform him that [facility] should be arriving later today or tomorrow. Patient reports BSC has arrived.  Reviewed with patient the importance of daily compliance with using non invasive vent.  Patient reports to me that he is complaint except when he is in the hospital.  Reviewed with patient that the respiratory therapist can review log of when patient is wearing the device.  Patient voiced understanding of the importance of wearing his device everyday.   I reviewed with the patient that Mitch and Respiratory therapist, Ethyl Hering will be in touch when they have updated orders. Patient voiced understanding.  No additional needs.  [facility] Patient.  Orpha Blade, RN, BSN, CEN [employer]- Transition of Care Team.  [facility] [phone]

## 2023-08-06 NOTE — Transitions of Care (Post Inpatient/ED Visit) (Signed)
 08/06/2023  Patient ID: Brett Beck, male   DOB: September 25, 1975, 48 y.o.   MRN: [id number]  Patient calls and states that he never got his BSC.   Placed call to Jermaine with [employer]  ( [phone]), inquired about delivery of this DME. Jermaine states he will get it out today.   Orpha Blade, RN, BSN, CEN [employer]- Transition of Care Team.  [facility] [phone]

## 2023-08-07 ENCOUNTER — Telehealth: Payer: Self-pay

## 2023-08-07 NOTE — Progress Notes (Signed)
 Complex Care Management Note  Care Guide Note 08/07/2023 Name: Brett Beck MRN: [id number] DOB: [birth year]  Brett Beck is a 48 y.o. year old male who sees Catheryn Cluck, MD for primary care. I reached out to [name] by phone today to offer complex care management services.  [name] was given information about Complex Care Management services today including:   The Complex Care Management services include support from the care team which includes your Nurse Care Manager, Clinical Social Worker, or Pharmacist.  The Complex Care Management team is here to help remove barriers to the health concerns and goals most important to you. Complex Care Management services are voluntary, and the patient may decline or stop services at any time by request to their care team member.   Complex Care Management Consent Status: Patient agreed to services and verbal consent obtained.   Follow up plan:  Telephone appointment with complex care management team member scheduled for:  08/14/23 at 1:00 p.m.   Encounter Outcome:  Patient Scheduled  [facility]  [facility], [facility] Care Management Assistant Direct Dial: [phone]  Fax: [phone]

## 2023-08-13 ENCOUNTER — Telehealth: Payer: Self-pay | Admitting: *Deleted

## 2023-08-13 DIAGNOSIS — I471 Supraventricular tachycardia, unspecified: Secondary | ICD-10-CM

## 2023-08-13 NOTE — Transitions of Care (Post Inpatient/ED Visit) (Signed)
 08/13/2023  Name: Brett Beck MRN: [id number] DOB: 02-13-1976  Today's TOC FU Call Status: Today's TOC FU Call Status:: Successful TOC FU Call Completed TOC FU Call Complete Date: 08/13/23 Patient's Name and Date of Birth confirmed.  Transition Care Management Follow-up Telephone Call Date of Discharge: 08/12/23 Discharge Facility: Other [facility]) Name of Other (Non-[facility]) Discharge Facility: [facility] Type of Discharge: Inpatient Admission Primary Inpatient Discharge Diagnosis:: COPD exacerbation How have you been since you were released from the hospital?: Better Any questions or concerns?: Yes Patient Questions/Concerns:: Patient needing new order for BiPAP-Patient will follow up with Pulmonology Patient Questions/Concerns Addressed: Other: (Patient will follow up with Pulmonology)  Items Reviewed: Did you receive and understand the discharge instructions provided?: Yes Medications obtained,verified, and reconciled?: Yes (Medications Reviewed) Any new allergies since your discharge?: No Dietary orders reviewed?: NA Do you have support at home?: Yes People in Home [RPT]: parent(s) Name of Support/Comfort Primary Source: Father/Gibson  Medications Reviewed Today: Medications Reviewed Today     Reviewed by Aura Leeds, RN (Registered Nurse) on 08/13/23 at 1200  Med List Status: <None>   Medication Order Taking? Sig Documenting Provider Last Dose Status Informant  acetaminophen  (TYLENOL ) 500 MG tablet [id number] Yes Take 500 mg by mouth every 6 (six) hours as needed for moderate pain. [provider]  Active Self  albuterol  (VENTOLIN  HFA) 108 (90 Base) MCG/ACT inhaler [id number] Yes Inhale 2 puffs into the lungs every 6 (six) hours as needed for wheezing or shortness of breath. Jerrlyn Morel, NP  Active   apixaban (ELIQUIS) 5 MG TABS tablet [id number] Yes Take 5 mg by mouth 2 (two) times daily. [provider]  Active    clonazePAM  (KLONOPIN ) 1 MG tablet [id number] Yes Take 1 tablet (1 mg total) by mouth 3 (three) times daily. Catheryn Cluck, MD  Active   cyclobenzaprine (FLEXERIL) 10 MG tablet [id number] Yes Take 10 mg by mouth 3 (three) times daily.  Patient taking differently: Take 10 mg by mouth 3 (three) times daily as needed.   [provider]  Active   diltiazem  (CARDIZEM ) 30 MG tablet [id number] Yes Take 1 tablet (30 mg total) by mouth 3 (three) times daily as needed (tachycardia or anxiety).  Patient taking differently: Take 30 mg by mouth 3 (three) times daily.   Catheryn Cluck, MD  Active   DULoxetine  (CYMBALTA ) 30 MG capsule [id number] Yes Take 1 capsule (30 mg total) by mouth daily for 7 days, THEN 2 capsules (60 mg total) daily for 23 days. Catheryn Cluck, MD  Active   fluconazole (DIFLUCAN) 200 MG tablet [id number]  Take 200 mg by mouth daily.  Patient not taking: Reported on 08/13/2023   [provider]  Active   Fluticasone -Umeclidin-Vilant (TRELEGY ELLIPTA  IN) [id number] Yes Inhale 1 puff into the lungs daily. [provider]  Active   furosemide  (LASIX ) 20 MG tablet [id number] Yes Take 1 tablet (20 mg total) by mouth daily as needed for edema or fluid. Catheryn Cluck, MD  Active   methylPREDNISolone  (MEDROL ) 4 MG tablet [id number] Yes Take 4 mg by mouth daily. Patient will pick up today and start taking [provider]  Active   metoprolol  tartrate (LOPRESSOR ) 25 MG tablet [id number] Yes Take 1 tablet (25 mg total) by mouth 2 (two) times daily.  Patient taking differently: Take 12.5 mg by mouth 2 (two) times daily.   Catheryn Cluck, MD  Active   miconazole (MICOTIN)  2 % powder [id number]  Apply 1 Application topically.  Patient not taking: Reported on 08/13/2023   [provider]  Active            Med Note Alva Jewels, [facility]   Tue Aug 04, 2023  3:15 PM) Have on hand for when needed   nystatin  (MYCOSTATIN ) 100000 UNIT/ML suspension [id number]  Yes Take 5 mLs (500,000 Units total) by mouth 4 (four) times daily. Jerrlyn Morel, NP  Active            Med Note Randye Buttner   Thu Apr 09, 2023  5:28 PM) As needed  omeprazole (PRILOSEC) 20 MG capsule [id number] Yes Take by mouth. [provider]  Active            Med Note Randye Buttner   Thu Apr 09, 2023  7:48 PM) Takes 20 mg every morning  predniSONE  (DELTASONE ) 5 MG tablet [id number]  Take 6 tablets (30 mg total) by mouth daily with breakfast for 3 days, THEN 4 tablets (20 mg total) daily with breakfast for 3 days, THEN 2 tablets (10 mg total) daily with breakfast for 3 days, THEN 1 tablet (5 mg total) daily with breakfast for 3 days.  Patient not taking: No sig reported   Catheryn Cluck, MD  Active   risperiDONE  (RISPERDAL ) 0.5 MG tablet [id number] Yes Take 1 tablet (0.5 mg total) by mouth 3 (three) times daily. Catheryn Cluck, MD  Active   Simethicone  250 MG CAPS [id number] Yes Take by mouth. [provider]  Active            Med Note Randye Buttner   Thu Apr 09, 2023  5:30 PM) As needed  Vitamin D, Ergocalciferol, (DRISDOL) 1.25 MG (50000 UNIT) CAPS capsule [id number]  Take 1 capsule by mouth once a week.  Patient not taking: Reported on 08/13/2023   [provider]  Active             Home Care and Equipment/Supplies: Were Home Health Services Ordered?: No Any new equipment or medical supplies ordered?: No  Functional Questionnaire: Do you need assistance with bathing/showering or dressing?: No Do you need assistance with meal preparation?: No Do you need assistance with eating?: No Do you have difficulty maintaining continence: No Do you need assistance with getting out of bed/getting out of a chair/moving?: No Do you have difficulty managing or taking your medications?: No  Follow up appointments reviewed: PCP Follow-up appointment confirmed?: Yes Date of PCP follow-up appointment?: 08/18/23 Follow-up Provider: Dr. Kasandra Pain Specialist [facility] Follow-up appointment confirmed?: No Follow-Up Specialty Provider:: Referral to Dr. Marlowe Sinclair with [facility] for lung volume reduction surgery-patient has number and will call today Reason Specialist Follow-Up Not Confirmed: Patient has Specialist Provider Number and will Call for Appointment Do you need transportation to your follow-up appointment?: No Do you understand care options if your condition(s) worsen?: Yes-patient verbalized understanding  SDOH Interventions Today    Flowsheet Row Most Recent Value  SDOH Interventions   Food Insecurity Interventions Intervention Not Indicated  Housing Interventions Intervention Not Indicated  Transportation Interventions Intervention Not Indicated  Utilities Interventions Intervention Not Indicated   RNCM placed call to [facility], left detailed message regarding patient recently being discharged and needing close follow up with Case Manager, included RNCM contact number.  Arna Better RN, BSN [place]  [facility] RN Care Manager [phone]

## 2023-08-14 ENCOUNTER — Other Ambulatory Visit: Payer: MEDICAID | Admitting: Licensed Clinical Social Worker

## 2023-08-14 NOTE — Patient Outreach (Signed)
 Called pt twice left voicemail requesting callback

## 2023-08-17 ENCOUNTER — Other Ambulatory Visit: Payer: Self-pay | Admitting: Family Medicine

## 2023-08-17 DIAGNOSIS — F411 Generalized anxiety disorder: Secondary | ICD-10-CM

## 2023-08-17 DIAGNOSIS — F331 Major depressive disorder, recurrent, moderate: Secondary | ICD-10-CM

## 2023-08-17 DIAGNOSIS — G8929 Other chronic pain: Secondary | ICD-10-CM

## 2023-08-18 ENCOUNTER — Ambulatory Visit: Payer: MEDICAID | Admitting: Family Medicine

## 2023-08-18 ENCOUNTER — Telehealth: Payer: Self-pay

## 2023-08-18 NOTE — Transitions of Care (Post Inpatient/ED Visit) (Signed)
 08/18/2023  Name: Brett Beck MRN: [id number] DOB: [birth year]  Today's TOC FU Call Status: TOC FU Call Complete Date: 08/18/23 Patient's Name and Date of Birth confirmed.  Transition Care Management Follow-up Telephone Call Date of Discharge: 08/17/23 Discharge Facility: Other [facility]) Name of Other (Non[facility]) Discharge Facility: [facility] Type of Discharge: Inpatient Admission Primary Inpatient Discharge Diagnosis:: Respiratory failure How have you been since you were released from the hospital?: Better (Doing well.) Any questions or concerns?: No  Items Reviewed: Did you receive and understand the discharge instructions provided?: Yes Medications obtained,verified, and reconciled?: Yes (Medications Reviewed) Any new allergies since your discharge?: No Dietary orders reviewed?: NA Do you have support at home?: Yes People in Home [RPT]: parent(s) Name of Support/Comfort Primary Source: Father Tricia  Medications Reviewed Today: Medications Reviewed Today     Reviewed by Rumalda Alan PENNER, RN (Registered Nurse) on 08/18/23 at 1200  Med List Status: <None>   Medication Order Taking? Sig Documenting Provider Last Dose Status Informant  acetaminophen  (TYLENOL ) 500 MG tablet [id number] Yes Take 500 mg by mouth every 6 (six) hours as needed for moderate pain. [provider]  Active Self  albuterol  (VENTOLIN  HFA) 108 (90 Base) MCG/ACT inhaler [id number] Yes Inhale 2 puffs into the lungs every 6 (six) hours as needed for wheezing or shortness of breath. Oley Bascom RAMAN, NP  Active   apixaban (ELIQUIS) 5 MG TABS tablet [id number] Yes Take 5 mg by mouth 2 (two) times daily. [provider]  Active   cefdinir (OMNICEF) 300 MG capsule [id number] Yes Take 300 mg by mouth 2 (two) times daily. [provider]  Active   clonazePAM  (KLONOPIN ) 1 MG tablet [id number] Yes Take 1 tablet (1 mg total) by mouth 3 (three) times daily. Sebastian Beverley NOVAK, MD  Active    cyclobenzaprine (FLEXERIL) 10 MG tablet [id number] Yes Take 10 mg by mouth 3 (three) times daily. [provider]  Active   diltiazem  (CARDIZEM ) 30 MG tablet [id number] Yes Take 1 tablet (30 mg total) by mouth 3 (three) times daily as needed (tachycardia or anxiety). Sebastian Beverley NOVAK, MD  Active   DULoxetine  (CYMBALTA ) 30 MG capsule [id number] Yes TAKE 1 CAPSULE(30 MG) BY MOUTH DAILY FOR 7 DAYS THEN TAKE 2 CAPSULES(60 MG) BY MOUTH DAILY FOR 23 DAYS Sebastian Beverley NOVAK, MD  Active   fluconazole (DIFLUCAN) 200 MG tablet [id number] Yes Take 200 mg by mouth daily. [provider]  Active   Fluticasone -Umeclidin-Vilant (TRELEGY ELLIPTA  IN) [id number] Yes Inhale 1 puff into the lungs daily. [provider]  Active   furosemide  (LASIX ) 20 MG tablet [id number] Yes Take 1 tablet (20 mg total) by mouth daily as needed for edema or fluid. Sebastian Beverley NOVAK, MD  Active   methylPREDNISolone  (MEDROL ) 4 MG tablet [id number]  Take 4 mg by mouth daily. Patient will pick up today and start taking  Patient not taking: Reported on 08/18/2023   [provider]  Active   metoprolol  tartrate (LOPRESSOR ) 25 MG tablet [id number] Yes Take 1 tablet (25 mg total) by mouth 2 (two) times daily. Sebastian Beverley NOVAK, MD  Active   miconazole (MICOTIN) 2 % powder [id number]  Apply 1 Application topically.  Patient not taking: Reported on 08/18/2023   [provider]  Active            Med Note LUCIO, [facility]   Tue Aug 04, 2023  3:15 PM) Have on hand for when needed   nystatin  (MYCOSTATIN ) 100000  UNIT/ML suspension [id number] Yes Take 5 mLs (500,000 Units total) by mouth 4 (four) times daily. Oley Bascom RAMAN, NP  Active            Med Note CARMIN SIN   Thu Apr 09, 2023  5:28 PM) As needed  omeprazole (PRILOSEC) 20 MG capsule [id number] Yes Take by mouth. [provider]  Active            Med Note [facility]   Thu Apr 09, 2023  7:48 PM) Takes 20 mg every morning   predniSONE  (DELTASONE ) 10 MG tablet [id number] Yes Take 10 mg by mouth daily with breakfast. [provider]  Active            Med Note (ROSE, [facility]   Tue Aug 18, 2023 12:00 PM) Per discharge instructions from [facility] on 08/17/2023   risperiDONE  (RISPERDAL ) 0.5 MG tablet [id number] Yes Take 1 tablet (0.5 mg total) by mouth 3 (three) times daily. Sebastian Beverley NOVAK, MD  Active   Simethicone  250 MG CAPS [id number]  Take by mouth.  Patient not taking: Reported on 08/18/2023   [provider]  Active            Med Note [facility]   Thu Apr 09, 2023  5:30 PM) As needed  Vitamin D, Ergocalciferol, (DRISDOL) 1.25 MG (50000 UNIT) CAPS capsule [id number]  Take 1 capsule by mouth once a week.  Patient not taking: Reported on 08/18/2023   [provider]  Active             Home Care and Equipment/Supplies: Were Home Health Services Ordered?: No Any new equipment or medical supplies ordered?: No  Functional Questionnaire: Do you need assistance with bathing/showering or dressing?: No Do you need assistance with meal preparation?: No Do you need assistance with eating?: No Do you have difficulty maintaining continence: Yes Do you need assistance with getting out of bed/getting out of a chair/moving?: No Do you have difficulty managing or taking your medications?: No  Follow up appointments reviewed: PCP Follow-up appointment confirmed?: Yes Date of PCP follow-up appointment?: 08/18/23 Follow-up Provider: Dr. Beverley Sebastian Specialist [facility] Follow-up appointment confirmed?: Yes Date of Specialist follow-up appointment?: 08/25/23 Follow-Up Specialty Provider:: Dr. Melva Reason Specialist Follow-Up Not Confirmed: Patient has Specialist Provider Number and will Call for Appointment Do you need transportation to your follow-up appointment?: No Do you understand care options if your condition(s) worsen?: Yes-patient verbalized understanding  SDOH  Interventions Today    Flowsheet Row Most Recent Value  SDOH Interventions   Food Insecurity Interventions Intervention Not Indicated  Housing Interventions Intervention Not Indicated  Utilities Interventions Intervention Not Indicated   Spoke with patient today. Reviewed last 2 admissions that he was intubated and on the vent.  Patient reports that he is not smoking, His father continues to smoke and patient is living with father. I encouraged patient to request that his father smokes outside.    Patient reports that he is now up to 6 liters of oxygen. Reviewed all medications and patient is taking his medications as prescribed.  Reviewed with patient that he has a follow up with PCP today, patient has forgotten. Encouraged patient to call the office and cancel if he can not keep appointment.  Reviewed importance of timely follow up with  PCP and pulmonary.   [employer] Managed [employer]:  Patient reports that he has never heard from his case manager and does not know who this person is.  Placed call  to [employer] at 12:09 pm.  Spoke with Beryl.  Reference number: [id number]. Placed call to [facility]- no answer.  [phone] Placed call to Luke Husband- no answer.   [phone] Placed call to supervisor Annemarie [phone] extension 3.  Left a detailed VM for a warm handoff.  Provided discharge date and facility. Provided information on 12 admissions in 6 months. Provided my contact information if someone needed to reach me.    Alan Ee, RN, BSN, CEN [employer]- Transition of Care Team.  [facility] [phone]

## 2023-08-21 ENCOUNTER — Ambulatory Visit: Payer: Self-pay

## 2023-08-21 ENCOUNTER — Telehealth (HOSPITAL_BASED_OUTPATIENT_CLINIC_OR_DEPARTMENT_OTHER): Payer: Self-pay | Admitting: *Deleted

## 2023-08-21 DIAGNOSIS — J441 Chronic obstructive pulmonary disease with (acute) exacerbation: Secondary | ICD-10-CM

## 2023-08-21 MED ORDER — TRELEGY ELLIPTA 100-62.5-25 MCG/ACT IN AEPB
1.0000 | INHALATION_SPRAY | Freq: Every day | RESPIRATORY_TRACT | 1 refills | Status: DC
Start: 2023-08-21 — End: 2023-09-10

## 2023-08-21 NOTE — Telephone Encounter (Signed)
 Patient states dose is 100 Sent to pharmacy of choice   [facility]  Copied from CRM [id number]. Topic: Clinical - Medication Question >> Aug 19, 2023 11:31 AM Brett Beck wrote: Reason for CRM: Patient would like to update [name] that his insurance does cover the Trelegy 3 in 1 inhaler and is requesting her to please send the prescription to [id number] - [place], [place] - [address] AT [facility] [address] [place] [address] Phone: [phone] Fax: [phone]

## 2023-08-21 NOTE — Telephone Encounter (Signed)
 Patient notified VIA phone that he needs to keep the appointment with Dr Sebastian on Monday and if breathing gets worse to go to the ER.  Patient is in agreeable with plan. Dm/cma

## 2023-08-21 NOTE — Telephone Encounter (Signed)
 FYI Only or Action Required?: Action required by provider: medication refill request.  Patient was last seen in primary care on 08/04/2023 by Sebastian Beverley NOVAK, MD. Called Nurse Triage reporting No chief complaint on file.. Symptoms began a week ago. Interventions attempted: Prescription medications: lasix . Symptoms are: gradually worsening.  Triage Disposition: See PCP When Office is Open (Within 3 Days)  Patient/caregiver understands and will follow disposition?: Yes   Copied from CRM [id number]. Topic: Clinical - Red Word Triage >> Aug 21, 2023 11:40 AM Berneda FALCON wrote: Red Word that prompted transfer to Nurse Triage: Pt states he just got out of the [place] at [place] for COPD but has swelling in both legs and feet (Left more so than right) to the point where he cannot put on pants or shoes.  States he was prescribed lasix  for this  but it is not helping and in fact, getting worse.  Would like to know if the medication needs to be changed or if he should be seen or what he should do for this. Reason for Disposition  [1] MILD swelling of both ankles (i.e., pedal edema) AND [2] new-onset or worsening  Answer Assessment - Initial Assessment Questions 1. ONSET: When did the pain start?      X week 2. LOCATION: Where is the pain located?      Bilateral leg swelling from knee to toes 3. PAIN: How bad is the pain?    (Scale 1-10; or mild, moderate, severe)   -  MILD (1-3): doesn't interfere with normal activities    -  MODERATE (4-7): interferes with normal activities (e.g., work or school) or awakens from sleep, limping    -  SEVERE (8-10): excruciating pain, unable to do any normal activities, unable to walk     7/10: taking tylenol  4. WORK OR EXERCISE: Has there been any recent work or exercise that involved this part of the body?      Na 5. CAUSE: What do you think is causing the leg pain?     unknown 6. OTHER SYMPTOMS: Do you have any other symptoms? (e.g., chest pain, back  pain, breathing difficulty, swelling, rash, fever, numbness, weakness)     no 7. PREGNANCY: Is there any chance you are pregnant? When was your last menstrual period?     N/a  Answer Assessment - Initial Assessment Questions 1. ONSET: When did the swelling start? (e.g., minutes, hours, days)     X week 2. LOCATION: What part of the leg is swollen?  Are both legs swollen or just one leg?     Bilateral legs from knees to toes 3. SEVERITY: How bad is the swelling? (e.g., localized; mild, moderate, severe)   - Localized: Small area of swelling localized to one leg.   - MILD pedal edema: Swelling limited to foot and ankle, pitting edema < 1/4 inch (6 mm) deep, rest and elevation eliminate most or all swelling.   - MODERATE edema: Swelling of lower leg to knee, pitting edema > 1/4 inch (6 mm) deep, rest and elevation only partially reduce swelling.   - SEVERE edema: Swelling extends above knee, facial or hand swelling present.      Moderate  4. REDNESS: Does the swelling look red or infected?     some 5. PAIN: Is the swelling painful to touch? If Yes, ask: How painful is it?   (Scale 1-10; mild, moderate or severe)     7/10 6. FEVER: Do you have a fever? If  Yes, ask: What is it, how was it measured, and when did it start?      no 7. CAUSE: What do you think is causing the leg swelling?     unknown 8. MEDICAL HISTORY: Do you have a history of blood clots (e.g., DVT), cancer, heart failure, kidney disease, or liver failure?     N/a 9. RECURRENT SYMPTOM: Have you had leg swelling before? If Yes, ask: When was the last time? What happened that time?     Yes recently and given lasix  10. OTHER SYMPTOMS: Do you have any other symptoms? (e.g., chest pain, difficulty breathing)       no 11. PREGNANCY: Is there any chance you are pregnant? When was your last menstrual period?       N/a  Pt was prescribed lasix  however pt is still having moderate amount of  bilateral leg swelling that is not getting any better. Pt would like to know if PCP can change Rx -lasix  to help or does he need to come in.  Appt scheduled as well just case - please pt  Protocols used: Leg Pain-A-AH, Leg Swelling and Edema-A-AH

## 2023-08-21 NOTE — Telephone Encounter (Signed)
 Patient is stating that taking furosemide  (LASIX ) 20 MG tablet has not helped with leg swelling and would like to know if he could increase dose.  Please advise

## 2023-08-24 ENCOUNTER — Ambulatory Visit: Payer: MEDICAID | Admitting: Family Medicine

## 2023-08-25 ENCOUNTER — Telehealth: Payer: Self-pay

## 2023-08-25 NOTE — Telephone Encounter (Signed)
 Copied from CRM [id number]. Topic: General - Deceased Patient >> 09/06/23 12:18 PM Brett Beck wrote: Adolph calling from [facility] stating that the Patient is deceased and passed on 2023/09/03, was informed by the patients father when trying to reschedule appointment. Future appointment with Dr. Sebastian has also been cancelled.

## 2023-08-25 NOTE — Progress Notes (Signed)
 Complex Care Management Care Guide Note  08/25/2023 Name: Brett Beck MRN: [id number] DOB: 10/31/1975  [name] is a 48 y.o. year old male who is a primary care patient of Sebastian Beverley NOVAK, MD and is actively engaged with the care management team. I reached out to Elsie JULIANNA Orange by phone today to assist with re-scheduling  with the Licensed Clinical Child psychotherapist.  Per patients father, patient is deceased as of 2023/09/17.   Dreama [facility]  [facility], [facility] Care Management Assistant Direct Dial: [phone]  Fax: [phone]

## 2023-08-25 DEATH — deceased

## 2023-08-27 ENCOUNTER — Ambulatory Visit: Payer: MEDICAID | Admitting: Family Medicine

## 2023-08-27 ENCOUNTER — Inpatient Hospital Stay: Payer: MEDICAID | Admitting: Family Medicine

## 2023-09-02 ENCOUNTER — Encounter: Payer: MEDICAID | Admitting: Pulmonary Disease

## 2023-09-09 NOTE — Telephone Encounter (Signed)
 Removed Dr. Sebastian as PCP and updated patient flag for deceased DOD 26-Aug-2023.
